# Patient Record
Sex: Male | Born: 1940
Health system: Southern US, Community
[De-identification: ages and names within clinical notes are randomized; demographics above are authoritative.]

## PROBLEM LIST (undated history)

## (undated) DIAGNOSIS — E785 Hyperlipidemia, unspecified: Secondary | ICD-10-CM

## (undated) DIAGNOSIS — I2510 Atherosclerotic heart disease of native coronary artery without angina pectoris: Secondary | ICD-10-CM

## (undated) DIAGNOSIS — C61 Malignant neoplasm of prostate: Secondary | ICD-10-CM

## (undated) DIAGNOSIS — R55 Syncope and collapse: Secondary | ICD-10-CM

## (undated) DIAGNOSIS — I10 Essential (primary) hypertension: Secondary | ICD-10-CM

## (undated) DIAGNOSIS — I249 Acute ischemic heart disease, unspecified: Secondary | ICD-10-CM

## (undated) HISTORY — DX: Hyperlipidemia, unspecified: E78.5

## (undated) HISTORY — DX: Atherosclerotic heart disease of native coronary artery without angina pectoris: I25.10

## (undated) HISTORY — PX: KNEE SURGERY: SHX244

## (undated) HISTORY — PX: CORONARY ANGIOPLASTY: SHX604

## (undated) HISTORY — PX: PROSTATE BIOPSY: SHX241

## (undated) HISTORY — DX: Acute ischemic heart disease, unspecified: I24.9

## (undated) HISTORY — DX: Syncope and collapse: R55

---

## 1998-08-19 ENCOUNTER — Encounter: Payer: Self-pay | Admitting: *Deleted

## 1998-08-19 ENCOUNTER — Inpatient Hospital Stay (HOSPITAL_COMMUNITY): Admission: EM | Admit: 1998-08-19 | Discharge: 1998-08-23 | Payer: Self-pay | Admitting: *Deleted

## 2004-09-23 DIAGNOSIS — I249 Acute ischemic heart disease, unspecified: Secondary | ICD-10-CM

## 2004-09-23 HISTORY — DX: Acute ischemic heart disease, unspecified: I24.9

## 2009-09-29 HISTORY — PX: CARDIOVASCULAR STRESS TEST: SHX262

## 2009-10-13 ENCOUNTER — Inpatient Hospital Stay (HOSPITAL_BASED_OUTPATIENT_CLINIC_OR_DEPARTMENT_OTHER): Admission: RE | Admit: 2009-10-13 | Discharge: 2009-10-13 | Payer: Self-pay | Admitting: Cardiovascular Disease

## 2009-10-13 HISTORY — PX: CARDIAC CATHETERIZATION: SHX172

## 2009-12-15 ENCOUNTER — Encounter: Admission: RE | Admit: 2009-12-15 | Discharge: 2009-12-15 | Payer: Self-pay | Admitting: Cardiology

## 2009-12-15 HISTORY — PX: US ECHOCARDIOGRAPHY: HXRAD669

## 2010-09-26 ENCOUNTER — Ambulatory Visit: Payer: Self-pay | Admitting: Cardiology

## 2010-09-27 ENCOUNTER — Ambulatory Visit: Payer: Self-pay | Admitting: Cardiology

## 2010-12-12 ENCOUNTER — Other Ambulatory Visit: Payer: Self-pay | Admitting: *Deleted

## 2010-12-12 DIAGNOSIS — E7800 Pure hypercholesterolemia, unspecified: Secondary | ICD-10-CM

## 2010-12-12 MED ORDER — SIMVASTATIN 20 MG PO TABS: 20.0000 mg | ORAL_TABLET | Freq: Every evening | ORAL | Status: DC

## 2010-12-12 NOTE — Telephone Encounter (Signed)
Refilled meds per fax request.  

## 2011-04-26 ENCOUNTER — Encounter: Payer: Self-pay | Admitting: Cardiology

## 2011-04-29 ENCOUNTER — Encounter: Payer: Self-pay | Admitting: Cardiology

## 2011-05-02 ENCOUNTER — Encounter: Payer: Self-pay | Admitting: Cardiology

## 2011-05-03 ENCOUNTER — Ambulatory Visit (INDEPENDENT_AMBULATORY_CARE_PROVIDER_SITE_OTHER): Payer: BC Managed Care – PPO | Admitting: *Deleted

## 2011-05-03 ENCOUNTER — Ambulatory Visit (INDEPENDENT_AMBULATORY_CARE_PROVIDER_SITE_OTHER): Payer: BC Managed Care – PPO | Admitting: Cardiology

## 2011-05-03 ENCOUNTER — Encounter: Payer: Self-pay | Admitting: Cardiology

## 2011-05-03 VITALS — BP 110/75 | HR 64 | Wt 188.0 lb

## 2011-05-03 DIAGNOSIS — E7800 Pure hypercholesterolemia, unspecified: Secondary | ICD-10-CM

## 2011-05-03 DIAGNOSIS — E785 Hyperlipidemia, unspecified: Secondary | ICD-10-CM

## 2011-05-03 DIAGNOSIS — I259 Chronic ischemic heart disease, unspecified: Secondary | ICD-10-CM

## 2011-05-03 LAB — HEPATIC FUNCTION PANEL
ALT: 20 U/L (ref 0–53)
AST: 17 U/L (ref 0–37)
Albumin: 4.5 g/dL (ref 3.5–5.2)
Alkaline Phosphatase: 51 U/L (ref 39–117)
Bilirubin, Direct: 0.1 mg/dL (ref 0.0–0.3)
Total Bilirubin: 1 mg/dL (ref 0.3–1.2)
Total Protein: 7.7 g/dL (ref 6.0–8.3)

## 2011-05-03 LAB — BASIC METABOLIC PANEL
BUN: 16 mg/dL (ref 6–23)
CO2: 29 mEq/L (ref 19–32)
Calcium: 9 mg/dL (ref 8.4–10.5)
Chloride: 104 mEq/L (ref 96–112)
Creatinine, Ser: 1 mg/dL (ref 0.4–1.5)
GFR: 75.89 mL/min (ref >60.00)
Glucose, Bld: 93 mg/dL (ref 70–99)
Potassium: 4.4 mEq/L (ref 3.5–5.1)
Sodium: 140 mEq/L (ref 135–145)

## 2011-05-03 LAB — LIPID PANEL
Cholesterol: 126 mg/dL (ref 0–200)
HDL: 39.5 mg/dL (ref >39.00)
LDL Cholesterol: 70 mg/dL (ref 0–99)
Total CHOL/HDL Ratio: 3
Triglycerides: 82 mg/dL (ref 0.0–149.0)
VLDL: 16.4 mg/dL (ref 0.0–40.0)

## 2011-05-03 NOTE — Assessment & Plan Note (Addendum)
Since last visit the patient has not had any symptoms of recurrent angina pectoris.   he has not had any dizziness or syncope.The patient is not on beta blocker because of intolerance and side effects including erectile dysfunction.

## 2011-05-03 NOTE — Progress Notes (Signed)
Kurt Diaz Date of Birth:  Jun 30, 1941 [facility] [address]   [address] [place], [place]  [address] [phone]           Fax   [phone]  HPI:   Current Outpatient Prescriptions  Medication Sig Dispense Refill  . aspirin 81 MG tablet Take 81 mg by mouth daily.        . nitroGLYCERIN (NITROSTAT) 0.4 MG SL tablet Place 0.4 mg under the tongue every 5 (five) minutes as needed.        . simvastatin (ZOCOR) 20 MG tablet Take 1 tablet (20 mg total) by mouth every evening.  30 tablet  11    No Known Allergies  There is no problem list on file for this patient.   History  Smoking status  . Never Smoker   Smokeless tobacco  . Not on file    History  Alcohol Use No    Family History  Problem Relation Age of Onset  . Heart attack Mother   . Stroke Mother   . Lung cancer Father     Review of Systems: The patient denies any heat or cold intolerance.  No weight gain or weight loss.  The patient denies headaches or blurry vision.  There is no cough or sputum production.  The patient denies dizziness.  There is no hematuria or hematochezia.  The patient denies any muscle aches or arthritis.  The patient denies any rash.  The patient denies frequent falling or instability.  There is no history of depression or anxiety.  All other systems were reviewed and are negative.   Physical Exam: Filed Vitals:   05/03/11 1033  BP: 110/75  Pulse: 64      Assessment / Plan:

## 2011-05-03 NOTE — Progress Notes (Signed)
[name] Date of Birth:  [birth year] [facility] [address]   [address] [place], [place]  [address] [phone]           Fax   [phone]  History of Present Illness: This pleasant 70 year old gentleman is seen for a six-month followup office visit.  He is a former patient of Dr. Reyes Ivan.  The patient had a history of an acute coronary syndrome in 2006.  At that time he was in the [place] airport and passed out and was found to be pulseless and underwent CPR.  He was taken to a local hospital and then to a heart Center where he underwent emergency cardiac catheterization and had intervention with 2 vessel disease being found and he had angioplasty and stenting of his LAD followed by angioplasty and stenting of his right coronary artery.  He had a nuclear scan on 09/29/09 which showed a small area of reversible ischemia at the apex and he underwent cardiac catheterization on 10/13/09 which showed that the stents were widely patent but that there was a proximal 70% stenosis in the diagonal too small for percutaneous intervention.  The patient has done well.  He's not having any chest pain.  He has not been getting any regular exercise but when he does use his treadmill he is not having any ischemic symptoms.  He is disappointed that he has not been able to lose weight and his fact his weight is up 5 pounds since last visit.  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 aspirin 81 MG tablet Take 81 mg by mouth daily.        . nitroGLYCERIN (NITROSTAT) 0.4 MG SL tablet Place 0.4 mg under the tongue every 5 (five) minutes as needed.        . simvastatin (ZOCOR) 20 MG tablet Take 1 tablet (20 mg total) by mouth every evening.  30 tablet  11    No Known  Allergies  There is no problem list on file for this patient.   History  Smoking status  . Never Smoker   Smokeless tobacco  . Not on file    History  Alcohol Use No    Family History  Problem Relation Age of Onset  . Heart attack Mother   . Stroke Mother   . Lung cancer Father     Review of Systems: Constitutional: no fever chills diaphoresis or fatigue or change in weight.  Head and neck: no hearing loss, no epistaxis, no photophobia or visual disturbance. Respiratory: No cough, shortness of breath or wheezing. Cardiovascular: No chest pain peripheral edema, palpitations. Gastrointestinal: No abdominal distention, no abdominal pain, no change in bowel habits hematochezia or melena. Genitourinary: No dysuria, no frequency, no urgency, no nocturia. Musculoskeletal:No arthralgias, no back pain, no gait disturbance or myalgias. Neurological: No dizziness, no headaches, no numbness, no seizures, no syncope, no weakness, no tremors. Hematologic: No lymphadenopathy, no easy bruising. Psychiatric: No confusion, no hallucinations, no sleep disturbance.    Physical Exam: Filed Vitals:   05/03/11 1033  BP: 110/75  Pulse: 64  The general appearance reveals a well-developed well-nourished gentleman in no distress.The head and neck exam reveals pupils equal and reactive.  Extraocular movements are full.  There is no  scleral icterus.  The mouth and pharynx are normal.  The neck is supple.  The carotids reveal no bruits.  The jugular venous pressure is normal.  The  thyroid is not enlarged.  There is no lymphadenopathy.  The chest is clear to percussion and auscultation.  There are no rales or rhonchi.  Expansion of the chest is symmetrical.  The precordium is quiet.  The first heart sound is normal.  The second heart sound is physiologically split.  There is no murmur gallop rub or click.  There is no abnormal lift or heave.  The abdomen is soft and nontender.  The bowel sounds are  normal.  The liver and spleen are not enlarged.  There are no abdominal masses.  There are no abdominal bruits.  Extremities reveal good pedal pulses.  There is no phlebitis or edema.  There is no cyanosis or clubbing.  Strength is normal and symmetrical in all extremities.  There is no lateralizing weakness.  There are no sensory deficits.  The skin is warm and dry.  There is no rash.     Assessment / Plan: Continue same medication.  Work harder at cutting back on calories to lose weight.  Increase regular exercise.  Recheck in 6 months for followup office visit And EKG and fasting lab work.  After that consider followup nuclear stress test.

## 2011-05-10 ENCOUNTER — Telehealth: Payer: Self-pay | Admitting: *Deleted

## 2011-05-10 NOTE — Telephone Encounter (Signed)
Patient returned call, advised of labs

## 2011-05-10 NOTE — Telephone Encounter (Signed)
Message copied by Burnell Blanks on Fri May 10, 2011  2:25 PM ------      Message from: Cassell Clement      Created: Sun May 05, 2011  9:15 PM       LP is good.      Chemistries all good.  CSD

## 2011-05-10 NOTE — Progress Notes (Signed)
Advised of labs 

## 2011-11-11 ENCOUNTER — Other Ambulatory Visit (INDEPENDENT_AMBULATORY_CARE_PROVIDER_SITE_OTHER): Payer: BC Managed Care – PPO | Admitting: *Deleted

## 2011-11-11 ENCOUNTER — Ambulatory Visit (INDEPENDENT_AMBULATORY_CARE_PROVIDER_SITE_OTHER): Payer: BC Managed Care – PPO | Admitting: Cardiology

## 2011-11-11 ENCOUNTER — Encounter: Payer: Self-pay | Admitting: Cardiology

## 2011-11-11 VITALS — BP 118/86 | HR 60 | Ht 68.0 in | Wt 182.0 lb

## 2011-11-11 DIAGNOSIS — I259 Chronic ischemic heart disease, unspecified: Secondary | ICD-10-CM

## 2011-11-11 DIAGNOSIS — E785 Hyperlipidemia, unspecified: Secondary | ICD-10-CM

## 2011-11-11 DIAGNOSIS — E7800 Pure hypercholesterolemia, unspecified: Secondary | ICD-10-CM

## 2011-11-11 LAB — BASIC METABOLIC PANEL
BUN: 15 mg/dL (ref 6–23)
CO2: 28 mEq/L (ref 19–32)
Calcium: 9 mg/dL (ref 8.4–10.5)
Chloride: 106 mEq/L (ref 96–112)
Creatinine, Ser: 0.8 mg/dL (ref 0.4–1.5)
GFR: 101.43 mL/min (ref >60.00)
Glucose, Bld: 98 mg/dL (ref 70–99)
Potassium: 4.1 mEq/L (ref 3.5–5.1)
Sodium: 141 mEq/L (ref 135–145)

## 2011-11-11 LAB — HEPATIC FUNCTION PANEL
ALT: 19 U/L (ref 0–53)
AST: 24 U/L (ref 0–37)
Albumin: 4.2 g/dL (ref 3.5–5.2)
Alkaline Phosphatase: 46 U/L (ref 39–117)
Bilirubin, Direct: 0.1 mg/dL (ref 0.0–0.3)
Total Bilirubin: 0.8 mg/dL (ref 0.3–1.2)
Total Protein: 7.1 g/dL (ref 6.0–8.3)

## 2011-11-11 LAB — LIPID PANEL
Cholesterol: 125 mg/dL (ref 0–200)
HDL: 42.3 mg/dL (ref >39.00)
LDL Cholesterol: 71 mg/dL (ref 0–99)
Total CHOL/HDL Ratio: 3
Triglycerides: 58 mg/dL (ref 0.0–149.0)
VLDL: 11.6 mg/dL (ref 0.0–40.0)

## 2011-11-11 MED ORDER — NITROGLYCERIN 0.4 MG SL SUBL: 0.4000 mg | SUBLINGUAL_TABLET | SUBLINGUAL | Status: DC | PRN

## 2011-11-11 NOTE — Patient Instructions (Signed)
Will obtain labs today and call you with the results (LP/BMET/HFP) Your physician recommends that you continue on your current medications as directed. Please refer to the Current Medication list given to you today. Your physician wants you to follow-up in: 6 months You will receive a reminder letter in the mail two months in advance. If you don't receive a letter, please call our office to schedule the follow-up appointment.  

## 2011-11-11 NOTE — Assessment & Plan Note (Signed)
He has occasional localized left anterior chest pain at rest but never with exercise such as using a treadmill or walking or climbing stairs.  He carries nitroglycerin but has never used it.

## 2011-11-11 NOTE — Assessment & Plan Note (Signed)
The patient remains on simvastatin.  We are checking labs today.  He is having no side effects.

## 2011-11-11 NOTE — Progress Notes (Signed)
Quick Note:  Please report to patient. The recent labs are stable. Continue same medication and careful diet. ______ 

## 2011-11-11 NOTE — Progress Notes (Signed)
[name] Date of Birth:  1941/06/29 [facility] [address] [place], [place]  [address] [phone]  Fax   [phone]  HPI: This pleasant 71 year old gentleman is seen for a six-month followup office visit.  He has a history of known ischemic heart disease.  He had acute coronary syndrome in 2006.  He had [place] airport and was found to be pulseless and underwent CPR and then underwent emergency cardiac catheterization and had intervention with 2 vessel disease being found and he had angioplasty and stenting to his LAD and angioplasty and stenting of his right coronary artery.  He had a nuclear scan in January 2011 which showed a small area of reversible ischemia at the apex and he underwent cardiac catheterization on 10/13/09 showed that the stents were widely patent but there was a proximal 70% stenosis in the diagonal too small for percutaneous intervention.  The patient has done well on medical management.  He is not having any recurrent exertional chest pain.  He has been able to lose weight.  He is on a low cholesterol diet.  Current Outpatient Prescriptions  Medication Sig Dispense Refill  . aspirin 81 MG tablet Take 81 mg by mouth daily.        . nitroGLYCERIN (NITROSTAT) 0.4 MG SL tablet Place 1 tablet (0.4 mg total) under the tongue every 5 (five) minutes as needed.  25 tablet  prn  . simvastatin (ZOCOR) 20 MG tablet Take 1 tablet (20 mg total) by mouth every evening.  30 tablet  11    No Known Allergies  Patient Active Problem List  Diagnoses  . Ischemic heart disease  . Hypercholesterolemia    History  Smoking status  . Never Smoker   Smokeless tobacco  . Not on file    History  Alcohol Use No    Family History  Problem Relation Age of Onset  . Heart attack Mother   . Stroke Mother   . Lung cancer Father     Review of Systems: The patient denies any heat or cold intolerance.  No weight gain or weight loss.  The  patient denies headaches or blurry vision.  There is no cough or sputum production.  The patient denies dizziness.  There is no hematuria or hematochezia.  The patient denies any muscle aches or arthritis.  The patient denies any rash.  The patient denies frequent falling or instability.  There is no history of depression or anxiety.  All other systems were reviewed and are negative.   Physical Exam: Filed Vitals:   11/11/11 0853  BP: 118/86  Pulse: 60   The general appearance reveals a well-developed well-nourished gentleman in no distress.The head and neck exam reveals pupils equal and reactive.  Extraocular movements are full.  There is no scleral icterus.  The mouth and pharynx are normal.  The neck is supple.  The carotids reveal no bruits.  The jugular venous pressure is normal.  The  thyroid is not enlarged.  There is no lymphadenopathy.  The chest is clear to percussion and auscultation.  There are no rales or rhonchi.  Expansion of the chest is symmetrical.  The precordium is quiet.  The first heart sound is normal.  The second heart sound is physiologically split.  There is no murmur gallop rub or click.  There is no abnormal lift or heave.  The abdomen is soft and nontender.  The bowel sounds are normal.  The liver and  spleen are not enlarged.  There are no abdominal masses.  There are no abdominal bruits.  Extremities reveal good pedal pulses.  There is no phlebitis or edema.  There is no cyanosis or clubbing.  Strength is normal and symmetrical in all extremities.  There is no lateralizing weakness.  There are no sensory deficits.  The skin is warm and dry.  There is no rash.  EKG today shows sinus bradycardia and otherwise within normal limits.  No ischemic changes.   Assessment / Plan: Await results of today's labs.  Continue same medication.  Recheck in 6 months for followup office visit lipid panel hepatic function panel and basal metabolic panel.

## 2011-11-12 ENCOUNTER — Telehealth: Payer: Self-pay | Admitting: *Deleted

## 2011-11-12 NOTE — Telephone Encounter (Signed)
Message copied by Burnell Blanks on Tue Nov 12, 2011  4:04 PM ------      Message from: Cassell Clement      Created: Mon Nov 11, 2011  7:19 PM       Please report to patient.  The recent labs are stable. Continue same medication and careful diet.

## 2011-11-12 NOTE — Telephone Encounter (Signed)
Mailed copy of labs and left message to call if any questions  

## 2011-12-13 ENCOUNTER — Other Ambulatory Visit: Payer: Self-pay | Admitting: Cardiology

## 2012-05-08 ENCOUNTER — Other Ambulatory Visit (INDEPENDENT_AMBULATORY_CARE_PROVIDER_SITE_OTHER): Payer: BC Managed Care – PPO

## 2012-05-08 ENCOUNTER — Ambulatory Visit (INDEPENDENT_AMBULATORY_CARE_PROVIDER_SITE_OTHER): Payer: BC Managed Care – PPO | Admitting: Cardiology

## 2012-05-08 ENCOUNTER — Encounter: Payer: Self-pay | Admitting: Cardiology

## 2012-05-08 VITALS — BP 120/78 | HR 60 | Ht 68.0 in | Wt 186.0 lb

## 2012-05-08 DIAGNOSIS — E7800 Pure hypercholesterolemia, unspecified: Secondary | ICD-10-CM

## 2012-05-08 DIAGNOSIS — R0989 Other specified symptoms and signs involving the circulatory and respiratory systems: Secondary | ICD-10-CM

## 2012-05-08 DIAGNOSIS — I259 Chronic ischemic heart disease, unspecified: Secondary | ICD-10-CM

## 2012-05-08 LAB — HEPATIC FUNCTION PANEL
ALT: 21 U/L (ref 0–53)
AST: 21 U/L (ref 0–37)
Albumin: 4.2 g/dL (ref 3.5–5.2)
Alkaline Phosphatase: 39 U/L (ref 39–117)
Bilirubin, Direct: 0.1 mg/dL (ref 0.0–0.3)
Total Bilirubin: 1.1 mg/dL (ref 0.3–1.2)
Total Protein: 7.3 g/dL (ref 6.0–8.3)

## 2012-05-08 LAB — LIPID PANEL
Cholesterol: 129 mg/dL (ref 0–200)
HDL: 39.4 mg/dL (ref >39.00)
LDL Cholesterol: 74 mg/dL (ref 0–99)
Total CHOL/HDL Ratio: 3
Triglycerides: 79 mg/dL (ref 0.0–149.0)
VLDL: 15.8 mg/dL (ref 0.0–40.0)

## 2012-05-08 LAB — BASIC METABOLIC PANEL
BUN: 17 mg/dL (ref 6–23)
CO2: 28 mEq/L (ref 19–32)
Calcium: 9.3 mg/dL (ref 8.4–10.5)
Chloride: 105 mEq/L (ref 96–112)
Creatinine, Ser: 0.9 mg/dL (ref 0.4–1.5)
GFR: 85.13 mL/min (ref >60.00)
Glucose, Bld: 97 mg/dL (ref 70–99)
Potassium: 4.6 mEq/L (ref 3.5–5.1)
Sodium: 140 mEq/L (ref 135–145)

## 2012-05-08 NOTE — Patient Instructions (Addendum)
Will obtain labs today and call you with the results   Your physician recommends that you continue on your current medications as directed. Please refer to the Current Medication list given to you today.  Your physician wants you to follow-up in: 6 months with fasting labs (lp/bmet/hfp)  You will receive a reminder letter in the mail two months in advance. If you don't receive a letter, please call our office to schedule the follow-up appointment.  

## 2012-05-08 NOTE — Assessment & Plan Note (Signed)
The patient has not been expressing any recurrent angina.  He has occasional episodes of sticking brief pain in various areas of his left chest which does not sound ischemic.  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

## 2012-05-08 NOTE — Assessment & Plan Note (Signed)
The patient has a history of hypercholesterolemia.  He remains on simvastatin 20 mg daily.  Is not having any side effects.  His weight is up slightly since last visit

## 2012-05-08 NOTE — Progress Notes (Signed)
Quick Note:  Please report to patient. The recent labs are stable. Continue same medication and careful diet. ______ 

## 2012-05-08 NOTE — Progress Notes (Signed)
Kurt Diaz Date of Birth:  Nov 24, 1940 [facility] [address] [place], [place]  [address] [phone]  Fax   [phone]  HPI: This pleasant 71 year old gentleman is seen for a scheduled 6 month followup office visit.  He has a past history of known ischemic heart disease.  He had acute coronary syndrome in 2006 he had syncope [place] airport and was found to be pulseless and underwent CPR and then underwent emergency cardiac catheterization and had intervention with 2 vessel disease being found.  He had angioplasty and stenting to his LAD and angioplasty and stenting of his right coronary artery.  His last nuclear stress test January 2011 showed a small area of reversible ischemia and he underwent cardiac catheterization on 10/13/2009 showed that the stents were widely patent.  There was a proximal 70% stenosis in the diagonal which was too small for PCI.  Current Outpatient Prescriptions  Medication Sig Dispense Refill  . aspirin 81 MG tablet Take 81 mg by mouth daily.      . nitroGLYCERIN (NITROSTAT) 0.4 MG SL tablet Place 0.4 mg under the tongue every 5 (five) minutes as needed.      . simvastatin (ZOCOR) 20 MG tablet Take 20 mg by mouth every evening.        No Known Allergies  Patient Active Problem List  Diagnosis  . Ischemic heart disease  . Hypercholesterolemia    History  Smoking status  . Never Smoker   Smokeless tobacco  . Not on file    History  Alcohol Use No    Family History  Problem Relation Age of Onset  . Heart attack Mother   . Stroke Mother   . Lung cancer Father     Review of Systems: The patient denies any heat or cold intolerance.  No weight gain or weight loss.  The patient denies headaches or blurry vision.  There is no cough or sputum production.  The patient denies dizziness.  There is no hematuria or hematochezia.  The patient denies any muscle aches or arthritis.  The patient denies any rash.  The  patient denies frequent falling or instability.  There is no history of depression or anxiety.  All other systems were reviewed and are negative.   Physical Exam: Filed Vitals:   05/08/12 0846  BP: 120/78  Pulse: 60   the general appearance reveals a well-developed well-nourished gentleman in no distress.The head and neck exam reveals pupils equal and reactive.  Extraocular movements are full.  There is no scleral icterus.  The mouth and pharynx are normal.  The neck is supple.  The carotids reveal no bruits.  The jugular venous pressure is normal.  The  thyroid is not enlarged.  There is no lymphadenopathy.  The chest is clear to percussion and auscultation.  There are no rales or rhonchi.  Expansion of the chest is symmetrical.  The precordium is quiet.  The first heart sound is normal.  The second heart sound is physiologically split.  There is no murmur gallop rub or click.  There is no abnormal lift or heave.  The abdomen is soft and nontender.  The bowel sounds are normal.  The liver and spleen are not enlarged.  There are no abdominal masses.  There are no abdominal bruits.  Extremities reveal good pedal pulses.  There is no phlebitis or edema.  There is no cyanosis or clubbing.  Strength is normal and symmetrical in all extremities.  There  is no lateralizing weakness.  There are no sensory deficits.  The skin is warm and dry.  There is no rash.      Assessment / Plan: The patient is to continue on same medication.  He will work harder on diet and weight loss.  Recheck in 6 months for office visit EKG and fasting lipid panel hepatic function panel and basal metabolic panel

## 2012-05-12 ENCOUNTER — Telehealth: Payer: Self-pay | Admitting: *Deleted

## 2012-05-12 NOTE — Telephone Encounter (Signed)
Mailed copy of labs and left message to call if any questions  

## 2012-05-12 NOTE — Telephone Encounter (Signed)
Message copied by Burnell Blanks on Tue May 12, 2012 12:01 PM ------      Message from: Cassell Clement      Created: Fri May 08, 2012  9:29 PM       Please report to patient.  The recent labs are stable. Continue same medication and careful diet.

## 2012-11-25 ENCOUNTER — Encounter: Payer: Self-pay | Admitting: *Deleted

## 2012-11-25 ENCOUNTER — Encounter: Payer: Self-pay | Admitting: Cardiology

## 2012-11-25 ENCOUNTER — Ambulatory Visit (INDEPENDENT_AMBULATORY_CARE_PROVIDER_SITE_OTHER): Payer: BC Managed Care – PPO | Admitting: Cardiology

## 2012-11-25 ENCOUNTER — Telehealth: Payer: Self-pay | Admitting: Cardiology

## 2012-11-25 ENCOUNTER — Other Ambulatory Visit: Payer: Self-pay | Admitting: Cardiology

## 2012-11-25 VITALS — BP 140/78 | HR 54 | Ht 68.0 in | Wt 189.0 lb

## 2012-11-25 DIAGNOSIS — R079 Chest pain, unspecified: Secondary | ICD-10-CM

## 2012-11-25 DIAGNOSIS — E7800 Pure hypercholesterolemia, unspecified: Secondary | ICD-10-CM

## 2012-11-25 DIAGNOSIS — I259 Chronic ischemic heart disease, unspecified: Secondary | ICD-10-CM

## 2012-11-25 LAB — CBC
HCT: 45.2 % (ref 39.0–52.0)
Hemoglobin: 14.9 g/dL (ref 13.0–17.0)
MCHC: 33.1 g/dL (ref 30.0–36.0)
MCV: 87.5 fl (ref 78.0–100.0)
Platelets: 240 10*3/uL (ref 150.0–400.0)
RBC: 5.17 Mil/uL (ref 4.22–5.81)
RDW: 14.3 % (ref 11.5–14.6)
WBC: 7.8 10*3/uL (ref 4.5–10.5)

## 2012-11-25 LAB — PROTIME-INR
INR: 1.1 ratio — ABNORMAL HIGH (ref 0.8–1.0)
Prothrombin Time: 11.3 s (ref 10.2–12.4)

## 2012-11-25 LAB — BASIC METABOLIC PANEL
BUN: 18 mg/dL (ref 6–23)
CO2: 29 mEq/L (ref 19–32)
Calcium: 8.9 mg/dL (ref 8.4–10.5)
Chloride: 105 mEq/L (ref 96–112)
Creatinine, Ser: 0.9 mg/dL (ref 0.4–1.5)
GFR: 89.42 mL/min (ref >60.00)
Glucose, Bld: 91 mg/dL (ref 70–99)
Potassium: 4.1 mEq/L (ref 3.5–5.1)
Sodium: 140 mEq/L (ref 135–145)

## 2012-11-25 NOTE — Telephone Encounter (Signed)
Pt calls today with increasing episodes of chest pain that will awaken him at night lasting several minutes. States it is not a lot of pressure just pain/discomfort. He has not taken any NTG for this. He also notes increasing dyspnea when walking up steps to work. "more out of breath than usual. Pt also notes his blood pressure has been going up BP 150-170/80  Appt made with Dr. Patty Sermons today at 11:00am    Pt was also overdue for 6 month follow-up Mylo Red RN

## 2012-11-25 NOTE — Telephone Encounter (Signed)
Seen in office today and he will have cardiac catheterization on March 7 Dr. Elease Hashimoto in the [facility]

## 2012-11-25 NOTE — Assessment & Plan Note (Signed)
With his new symptoms over the past 3 weeks we discussed possible repeat Myoview versus cardiac catheterization.  He has not had a Myoview since his last catheter.  His previous Myoview resulted in a cardiac catheterization because it was abnormal and showed reversible ischemia.  We are likely to find the same thing this time and so we will proceed directly to diagnostic catheter.  He is pleased with that plan.  He will undergo diagnostic cardiac catheterization on Friday, March 7 by Dr. Elease Hashimoto in the [facility] again.

## 2012-11-25 NOTE — Telephone Encounter (Signed)
noted 

## 2012-11-25 NOTE — Patient Instructions (Addendum)
[facility] 11/27/12 @ 8:30 see letter  Your physician recommends that you return for lab work in: today bmet cbc pt/inr   Your physician recommends that you continue on your current medications as directed. Please refer to the Current Medication list given to you today.

## 2012-11-25 NOTE — Assessment & Plan Note (Signed)
Patient has a history of hypercholesterolemia.  He remains on simvastatin.  No myalgias.

## 2012-11-25 NOTE — Progress Notes (Signed)
Kurt Diaz Date of Birth:  07/20/1941  [facility] [address] [place],   [address] [phone]         Fax   [phone]  History of Present Illness: This pleasant 72-year-old gentleman is seen for a work in  office visit.  He has been experiencing some exertional dyspnea and some chest pain intermittently for the past several weeks.  His discomfort has been mainly at night.  It lasts only about 3 minutes and goes away.  Twice it also appeared to radiate to his jaw.  He was not too concerned about it and has not taken any of his sublingual nitroglycerin.  There was no nausea or diaphoresis.  The patient admits that he has not been doing much exercise since Christmas and his weight is up 3 pounds and he has been off his careful diet.  He has noted more exertional dyspnea in the past several weeks, particularly if he has to climb 2 flights of steps.  He has a past history of known ischemic heart disease. He had acute coronary syndrome in 2006 he had syncope in the [place] airport and was found to be pulseless and underwent CPR and then underwent emergency cardiac catheterization and had intervention with 2 vessel disease being found. He had angioplasty and stenting to his LAD and angioplasty and stenting of his right coronary artery. His last nuclear stress test January 2011 showed a small area of reversible ischemia and as a result he underwent cardiac catheterization by Dr. Nahserr on 10/13/2009 which showed that the stents were widely patent. There was a proximal 70% stenosis in the diagonal which was too small for PCI.  He has been doing well on medical therapy until about the past 3 weeks when these symptoms started.   Current Outpatient Prescriptions  Medication Sig Dispense Refill  . aspirin 81 MG tablet Take 81 mg by mouth daily.      . nitroGLYCERIN (NITROSTAT) 0.4 MG SL tablet Place 0.4 mg under the tongue every 5 (five) minutes as needed.      .  simvastatin (ZOCOR) 20 MG tablet Take 20 mg by mouth every evening.      . tadalafil (CIALIS) 10 MG tablet Take 10 mg by mouth daily as needed for erectile dysfunction.       No current facility-administered medications for this visit.    No Known Allergies  Patient Active Problem List  Diagnosis  . Ischemic heart disease  . Hypercholesterolemia    History  Smoking status  . Never Smoker   Smokeless tobacco  . Not on file    History  Alcohol Use No    Family History  Problem Relation Age of Onset  . Heart attack Mother   . Stroke Mother   . Lung cancer Father     Review of Systems: Constitutional: no fever chills diaphoresis or fatigue or change in weight.  Head and neck: no hearing loss, no epistaxis, no photophobia or visual disturbance. Respiratory: No cough, shortness of breath or wheezing. Cardiovascular: No chest pain peripheral edema, palpitations. Gastrointestinal: No abdominal distention, no abdominal pain, no change in bowel habits hematochezia or melena. Genitourinary: No dysuria, no frequency, no urgency, no nocturia. Musculoskeletal:No arthralgias, no back pain, no gait disturbance or myalgias. Neurological: No dizziness, no headaches, no numbness, no seizures, no syncope, no weakness, no tremors. Hematologic: No lymphadenopathy, no easy bruising. Psychiatric: No confusion, no hallucinations, no sleep disturbance.    Physical Exam: Filed Vitals:     11/25/12 1114  BP: 140/78  Pulse: 54   the general appearance reveals a well-developed well-nourished gentleman in no distress.  He is mildly overweight and has gained 3 pounds since last visit.The head and neck exam reveals pupils equal and reactive.  Extraocular movements are full.  There is no scleral icterus.  The mouth and pharynx are normal.  The neck is supple.  The carotids reveal no bruits.  The jugular venous pressure is normal.  The  thyroid is not enlarged.  There is no lymphadenopathy.  The chest  is clear to percussion and auscultation.  There are no rales or rhonchi.  Expansion of the chest is symmetrical.  The precordium is quiet.  The first heart sound is normal.  The second heart sound is physiologically split.  There is no murmur gallop rub or click.  There is no abnormal lift or heave.  The abdomen is soft and nontender.  The bowel sounds are normal.  The liver and spleen are not enlarged.  There are no abdominal masses.  There are no abdominal bruits.  Extremities reveal good pedal pulses.  There is no phlebitis or edema.  There is no cyanosis or clubbing.  Strength is normal and symmetrical in all extremities.  There is no lateralizing weakness.  There are no sensory deficits.  The skin is warm and dry.  There is no rash.  EKG shows sinus bradycardia and no ischemic changes   Assessment / Plan: Possible recurrent angina pectoris.  He is also having increased exertional dyspnea which was a symptom that he had prior to his previous heart attack.  We will proceed with cardiac catheterization in the [facility] on Friday, March 7    

## 2012-11-25 NOTE — Telephone Encounter (Signed)
New Prob   Pt having chest pain, persistant for about a week (mainly at night). Concerned and would like to speak to nurse.

## 2012-11-26 ENCOUNTER — Telehealth: Payer: Self-pay | Admitting: Cardiology

## 2012-11-26 NOTE — Telephone Encounter (Signed)
Pt needs to talk to you regarding procedure

## 2012-11-26 NOTE — Telephone Encounter (Signed)
Message copied by Burnell Blanks on Thu Nov 26, 2012  3:48 PM ------      Message from: Cassell Clement      Created: Wed Nov 25, 2012  5:54 PM       Precath labs are normal please report. ------

## 2012-11-26 NOTE — Telephone Encounter (Signed)
Rescheduled patients cath for Monday at 7:30 per patient request, was concerned about potential bad weather. Did advise if symptoms worse to go to ED, verbalized understanding and stated he would.  Advised patient of lab results   

## 2012-11-26 NOTE — Telephone Encounter (Signed)
New problem   Pt is scheduled to have a cather tomorrow 11/27/12 and want to r/s to another day. Please call pt concerning this matter.

## 2012-11-26 NOTE — Telephone Encounter (Signed)
Rescheduled patients cath for Monday at 7:30 per patient request, was concerned about potential bad weather. Did advise if symptoms worse to go to ED, verbalized understanding and stated he would.  Advised patient of lab results

## 2012-11-27 ENCOUNTER — Encounter (HOSPITAL_BASED_OUTPATIENT_CLINIC_OR_DEPARTMENT_OTHER): Admission: RE | Payer: Self-pay | Source: Ambulatory Visit

## 2012-11-27 ENCOUNTER — Inpatient Hospital Stay (HOSPITAL_BASED_OUTPATIENT_CLINIC_OR_DEPARTMENT_OTHER)
Admission: RE | Admit: 2012-11-27 | Payer: BC Managed Care – PPO | Source: Ambulatory Visit | Admitting: Cardiovascular Disease

## 2012-11-27 SURGERY — JV LEFT HEART CATHETERIZATION WITH CORONARY ANGIOGRAM

## 2012-11-30 ENCOUNTER — Encounter (HOSPITAL_BASED_OUTPATIENT_CLINIC_OR_DEPARTMENT_OTHER): Payer: Self-pay

## 2012-11-30 ENCOUNTER — Encounter (HOSPITAL_BASED_OUTPATIENT_CLINIC_OR_DEPARTMENT_OTHER)
Admission: RE | Disposition: A | Discharge status: Home / Self Care | Payer: Self-pay | Source: Ambulatory Visit | Attending: Cardiovascular Disease

## 2012-11-30 ENCOUNTER — Inpatient Hospital Stay (HOSPITAL_BASED_OUTPATIENT_CLINIC_OR_DEPARTMENT_OTHER)
Admission: RE | Admit: 2012-11-30 | Discharge: 2012-11-30 | Disposition: A | Discharge status: Home / Self Care | Payer: BC Managed Care – PPO | Source: Ambulatory Visit | Attending: Cardiovascular Disease | Admitting: Cardiovascular Disease

## 2012-11-30 DIAGNOSIS — I2510 Atherosclerotic heart disease of native coronary artery without angina pectoris: Secondary | ICD-10-CM | POA: Diagnosis not present

## 2012-11-30 DIAGNOSIS — Z9861 Coronary angioplasty status: Secondary | ICD-10-CM | POA: Insufficient documentation

## 2012-11-30 DIAGNOSIS — R079 Chest pain, unspecified: Secondary | ICD-10-CM | POA: Insufficient documentation

## 2012-11-30 SURGERY — JV LEFT HEART CATHETERIZATION WITH CORONARY ANGIOGRAM
Anesthesia: Moderate Sedation

## 2012-11-30 MED ORDER — DIAZEPAM 2 MG PO TABS: 2.0000 mg | ORAL_TABLET | ORAL | Status: DC | PRN

## 2012-11-30 MED ORDER — SODIUM CHLORIDE 0.45 % IV SOLN: INTRAVENOUS | Status: AC

## 2012-11-30 MED ORDER — OXYCODONE-ACETAMINOPHEN 5-325 MG PO TABS: 1.0000 | ORAL_TABLET | ORAL | Status: DC | PRN

## 2012-11-30 MED ORDER — ACETAMINOPHEN 325 MG PO TABS: 650.0000 mg | ORAL_TABLET | ORAL | Status: DC | PRN

## 2012-11-30 MED ORDER — ASPIRIN 81 MG PO CHEW: 324.0000 mg | CHEWABLE_TABLET | Freq: Once | ORAL | Status: DC

## 2012-11-30 MED ORDER — SODIUM CHLORIDE 0.9 % IJ SOLN: 3.0000 mL | Freq: Two times a day (BID) | INTRAMUSCULAR | Status: DC

## 2012-11-30 MED ORDER — ONDANSETRON HCL 4 MG/2ML IJ SOLN: 4.0000 mg | Freq: Four times a day (QID) | INTRAMUSCULAR | Status: DC | PRN

## 2012-11-30 MED ORDER — SODIUM CHLORIDE 0.9 % IJ SOLN: 3.0000 mL | INTRAMUSCULAR | Status: DC | PRN

## 2012-11-30 MED ORDER — SODIUM CHLORIDE 0.9 % IV SOLN: 250.0000 mL | INTRAVENOUS | Status: DC | PRN

## 2012-11-30 MED ORDER — ASPIRIN EC 325 MG PO TBEC: 325.0000 mg | DELAYED_RELEASE_TABLET | Freq: Every day | ORAL | Status: DC

## 2012-11-30 NOTE — OR Nursing (Signed)
+  Allen's test right hand 

## 2012-11-30 NOTE — OR Nursing (Signed)
Tegaderm dressing applied, site level 0, bedrest begins at 0820 

## 2012-11-30 NOTE — H&P (View-Only) (Signed)
Kurt Diaz Date of Birth:  07-20-1941 [facility] [address] [place], [place]  [address] [phone]         Fax   [phone]  History of Present Illness: This pleasant 72 year old gentleman is seen for a work in  office visit.  He has been experiencing some exertional dyspnea and some chest pain intermittently for the past several weeks.  His discomfort has been mainly at night.  It lasts only about 3 minutes and goes away.  Twice it also appeared to radiate to his jaw.  He was not too concerned about it and has not taken any of his sublingual nitroglycerin.  There was no nausea or diaphoresis.  The patient admits that he has not been doing much exercise since Christmas and his weight is up 3 pounds and he has been off his careful diet.  He has noted more exertional dyspnea in the past several weeks, particularly if he has to climb 2 flights of steps.  He has a past history of known ischemic heart disease. He had acute coronary syndrome in 2006 he [place] and was found to be pulseless and underwent CPR and then underwent emergency cardiac catheterization and had intervention with 2 vessel disease being found. He had angioplasty and stenting to his LAD and angioplasty and stenting of his right coronary artery. His last nuclear stress test January 2011 showed a small area of reversible ischemia and as a result he underwent cardiac catheterization by Dr. Reed Breech on 10/13/2009 which showed that the stents were widely patent. There was a proximal 70% stenosis in the diagonal which was too small for PCI.  He has been doing well on medical therapy until about the past 3 weeks when these symptoms started.   Current Outpatient Prescriptions  Medication Sig Dispense Refill  . aspirin 81 MG tablet Take 81 mg by mouth daily.      . nitroGLYCERIN (NITROSTAT) 0.4 MG SL tablet Place 0.4 mg under the tongue every 5 (five) minutes as needed.      .  simvastatin (ZOCOR) 20 MG tablet Take 20 mg by mouth every evening.      . tadalafil (CIALIS) 10 MG tablet Take 10 mg by mouth daily as needed for erectile dysfunction.       No current facility-administered medications for this visit.    No Known Allergies  Patient Active Problem List  Diagnosis  . Ischemic heart disease  . Hypercholesterolemia    History  Smoking status  . Never Smoker   Smokeless tobacco  . Not on file    History  Alcohol Use No    Family History  Problem Relation Age of Onset  . Heart attack Mother   . Stroke Mother   . Lung cancer Father     Review of Systems: Constitutional: no fever chills diaphoresis or fatigue or change in weight.  Head and neck: no hearing loss, no epistaxis, no photophobia or visual disturbance. Respiratory: No cough, shortness of breath or wheezing. Cardiovascular: No chest pain peripheral edema, palpitations. Gastrointestinal: No abdominal distention, no abdominal pain, no change in bowel habits hematochezia or melena. Genitourinary: No dysuria, no frequency, no urgency, no nocturia. Musculoskeletal:No arthralgias, no back pain, no gait disturbance or myalgias. Neurological: No dizziness, no headaches, no numbness, no seizures, no syncope, no weakness, no tremors. Hematologic: No lymphadenopathy, no easy bruising. Psychiatric: No confusion, no hallucinations, no sleep disturbance.    Physical Exam: Filed Vitals:  11/25/12 1114  BP: 140/78  Pulse: 54   the general appearance reveals a well-developed well-nourished gentleman in no distress.  He is mildly overweight and has gained 3 pounds since last visit.The head and neck exam reveals pupils equal and reactive.  Extraocular movements are full.  There is no scleral icterus.  The mouth and pharynx are normal.  The neck is supple.  The carotids reveal no bruits.  The jugular venous pressure is normal.  The  thyroid is not enlarged.  There is no lymphadenopathy.  The chest  is clear to percussion and auscultation.  There are no rales or rhonchi.  Expansion of the chest is symmetrical.  The precordium is quiet.  The first heart sound is normal.  The second heart sound is physiologically split.  There is no murmur gallop rub or click.  There is no abnormal lift or heave.  The abdomen is soft and nontender.  The bowel sounds are normal.  The liver and spleen are not enlarged.  There are no abdominal masses.  There are no abdominal bruits.  Extremities reveal good pedal pulses.  There is no phlebitis or edema.  There is no cyanosis or clubbing.  Strength is normal and symmetrical in all extremities.  There is no lateralizing weakness.  There are no sensory deficits.  The skin is warm and dry.  There is no rash.  EKG shows sinus bradycardia and no ischemic changes   Assessment / Plan: Possible recurrent angina pectoris.  He is also having increased exertional dyspnea which was a symptom that he had prior to his previous heart attack.  We will proceed with cardiac catheterization in the [facility] lab on Friday, March 7

## 2012-11-30 NOTE — OR Nursing (Signed)
Discharge instructions reviewed and signed, pt stated understanding, ambulated in hall without difficulty, site level 0, transported to wife's car via wheelchair 

## 2012-11-30 NOTE — CV Procedure (Signed)
      Catheterization   Indication: Chest pain previous stents to LAD and RCA  Procedure: After informed consent and clinical "time out" the right groin was prepped and draped in a sterile fashion.  A 5Fr sheath was placed in the right femoral artery using seldinger technique and local lidocaine.  Standard JL4, JR4 and angled pigtail catheters were used to engage the coronary arteries.  Coronary arteries were visualized in orthogonal views using caudal and cranial angulation.  RAO ventriculography was done using 28* cc of contrast.    Medications:   Versed: 2 mg's  Fentanyl: 0 ug's  Coronary Arteries: Right dominant with no anomalies  LM: 20% proximal  LAD: Widely patent stents in proximal and mid vessel    D1:  70% ostial jailed by stents no change from previous caht  D2 normal  Circumflex: Large somewhat codominant vessel   OM1: Normal  OM2: Normal  RCA:  Small caliber vessel Stent in mid vessel somewhat oversized and widely patent. 30% tubular stenosis distal to stent   PDA: normal small  PLA: normal small  Ventriculography: EF: 55 %, no RWMA;s   Hemodynamics:  Aortic Pressure: 126 59  mmHg  LV Pressure: 128 10 15  mmHg  Impression:  Stable CAD with widely patent stents to RCA and LAD.  Jailed ostium of D1 unchanged Continue medical Rx  Charlton Haws 11/30/2012 8:07 AM

## 2012-11-30 NOTE — OR Nursing (Signed)
Meal served 

## 2012-11-30 NOTE — Interval H&P Note (Signed)
History and Physical Interval Note:  11/30/2012 7:42 AM  Kurt Diaz  has presented today for surgery, with the diagnosis of chest pain  The various methods of treatment have been discussed with the patient and family. After consideration of risks, benefits and other options for treatment, the patient has consented to  Procedure(s): [facility] LEFT HEART CATHETERIZATION WITH CORONARY ANGIOGRAM (N/A) as a surgical intervention .  The patient's history has been reviewed, patient examined, no change in status, stable for surgery.  I have reviewed the patient's chart and labs.  Questions were answered to the patient's satisfaction.     Charlton Haws

## 2012-12-18 ENCOUNTER — Ambulatory Visit (INDEPENDENT_AMBULATORY_CARE_PROVIDER_SITE_OTHER): Payer: BC Managed Care – PPO | Admitting: Cardiology

## 2012-12-18 ENCOUNTER — Encounter: Payer: Self-pay | Admitting: Cardiology

## 2012-12-18 VITALS — BP 144/76 | HR 55 | Ht 68.0 in | Wt 188.0 lb

## 2012-12-18 DIAGNOSIS — E7800 Pure hypercholesterolemia, unspecified: Secondary | ICD-10-CM

## 2012-12-18 DIAGNOSIS — N400 Enlarged prostate without lower urinary tract symptoms: Secondary | ICD-10-CM | POA: Insufficient documentation

## 2012-12-18 DIAGNOSIS — I119 Hypertensive heart disease without heart failure: Secondary | ICD-10-CM

## 2012-12-18 DIAGNOSIS — I259 Chronic ischemic heart disease, unspecified: Secondary | ICD-10-CM

## 2012-12-18 DIAGNOSIS — C61 Malignant neoplasm of prostate: Secondary | ICD-10-CM | POA: Diagnosis not present

## 2012-12-18 MED ORDER — LISINOPRIL 10 MG PO TABS: 10.0000 mg | ORAL_TABLET | Freq: Every day | ORAL | Status: DC

## 2012-12-18 NOTE — Assessment & Plan Note (Signed)
The patient has had no recurrence of his chest pain since his cardiac catheterization.  In retrospect he thinks some of his exertional dyspnea was from being out of shape.  He is trying to walk on the treadmill more faithfully now.

## 2012-12-18 NOTE — Assessment & Plan Note (Signed)
Patient has a history of hypercholesterolemia and is on simvastatin.  He will continue current therapy.  He is not having any side effects from the medication.  We will recheck him for lipids in about 4 months

## 2012-12-18 NOTE — Patient Instructions (Addendum)
ADD LISINOPRIL 10 MG DAILY, RX SENT TO [facility]  Your physician wants you to follow-up in: 4 months with fasting labs (lp/bmet/hfp) You will receive a reminder letter in the mail two months in advance. If you don't receive a letter, please call our office to schedule the follow-up appointment.

## 2012-12-18 NOTE — Progress Notes (Signed)
Kurt Diaz Date of Birth:  [birth year] [facility] [address] [place], [place]  [address] [phone]  Fax   [phone]  HPI: This pleasant 72 year old gentleman is seen for a scheduled followup office visit.  He has a past history of known ischemic heart disease. He had acute coronary syndrome in 2006 he had [place]ope [place] airport and was found to be pulseless and underwent CPR and then underwent emergency cardiac catheterization and had intervention with 2 vessel disease being found. He had angioplasty and stenting to his LAD and angioplasty and stenting of his right coronary artery. His last nuclear stress test January 2011 showed a small area of reversible ischemia and as a result he underwent cardiac catheterization by Dr. Elease Hashimoto on 10/13/2009 which showed that the stents were widely patent. There was a proximal 70% stenosis in the diagonal which was too small for PCI. He has been doing well on medical therapy until about the past 3 weeks when these symptoms started.  The patient was seen in the office on 11/25/12 as a work in complaining of exertional dyspnea and some intermittent chest discomfort and left jaw discomfort.  For this reason we arranged for repeat cardiac catheterization which was done by Dr. Charlton Haws on 11/30/12 and it showed that his previous stents were widely patent and no new lesions were seen.  Continued medical management was advised.   Current Outpatient Prescriptions  Medication Sig Dispense Refill  . aspirin 81 MG tablet Take 81 mg by mouth daily.      . nitroGLYCERIN (NITROSTAT) 0.4 MG SL tablet Place 0.4 mg under the tongue every 5 (five) minutes as needed.      . simvastatin (ZOCOR) 20 MG tablet Take 20 mg by mouth every evening.      . tadalafil (CIALIS) 10 MG tablet Take 10 mg by mouth daily as needed (bph).       . lisinopril (PRINIVIL,ZESTRIL) 10 MG tablet Take 1 tablet (10 mg total) by mouth daily.  30 tablet  5   No  current facility-administered medications for this visit.    No Known Allergies  Patient Active Problem List  Diagnosis  . Ischemic heart disease  . Hypercholesterolemia  . Prostate cancer  . BPH (benign prostatic hyperplasia)    History  Smoking status  . Never Smoker   Smokeless tobacco  . Not on file    History  Alcohol Use No    Family History  Problem Relation Age of Onset  . Heart attack Mother   . Stroke Mother   . Lung cancer Father     Review of Systems: The patient denies any heat or cold intolerance.  No weight gain or weight loss.  The patient denies headaches or blurry vision.  There is no cough or sputum production.  The patient denies dizziness.  There is no hematuria or hematochezia.  The patient denies any muscle aches or arthritis.  The patient denies any rash.  The patient denies frequent falling or instability.  There is no history of depression or anxiety.  All other systems were reviewed and are negative.   Physical Exam: Filed Vitals:   12/18/12 1603  BP: 144/76  Pulse: 55   the general appearance reveals a well-developed well-nourished gentleman in no distress.The head and neck exam reveals pupils equal and reactive.  Extraocular movements are full.  There is no scleral icterus.  The mouth and pharynx are normal.  The neck is supple.  The carotids reveal no bruits.  The jugular venous pressure is normal.  The  thyroid is not enlarged.  There is no lymphadenopathy.  The chest is clear to percussion and auscultation.  There are no rales or rhonchi.  Expansion of the chest is symmetrical.  The precordium is quiet.  The first heart sound is normal.  The second heart sound is physiologically split.  There is no murmur gallop rub or click.  There is no abnormal lift or heave.  The abdomen is soft and nontender.  The bowel sounds are normal.  The liver and spleen are not enlarged.  There are no abdominal masses.  There are no abdominal bruits.  Extremities  reveal good pedal pulses.  There is no phlebitis or edema.  There is no cyanosis or clubbing.  Strength is normal and symmetrical in all extremities.  There is no lateralizing weakness.  There are no sensory deficits.  The skin is warm and dry.  There is no rash.  EKG shows sinus bradycardia and is otherwise within normal limits.   Assessment / Plan: Continue same medication.  Recheck in 4 months for followup office visit and fasting lab work

## 2012-12-18 NOTE — Assessment & Plan Note (Signed)
Today in the office his blood pressure is running slightly high.  He states that when he checks it at home it is also been running slightly high.  He has normal renal function.  We will add lisinopril 10 mg daily for additional blood pressure control and for vascular protection.

## 2012-12-29 ENCOUNTER — Other Ambulatory Visit: Payer: Self-pay | Admitting: *Deleted

## 2012-12-29 MED ORDER — SIMVASTATIN 20 MG PO TABS: 20.0000 mg | ORAL_TABLET | Freq: Every evening | ORAL | Status: DC

## 2012-12-30 ENCOUNTER — Other Ambulatory Visit: Payer: Self-pay | Admitting: *Deleted

## 2013-04-19 ENCOUNTER — Encounter: Payer: Self-pay | Admitting: Cardiology

## 2013-04-19 ENCOUNTER — Other Ambulatory Visit (INDEPENDENT_AMBULATORY_CARE_PROVIDER_SITE_OTHER): Payer: BC Managed Care – PPO

## 2013-04-19 ENCOUNTER — Ambulatory Visit (INDEPENDENT_AMBULATORY_CARE_PROVIDER_SITE_OTHER): Payer: BC Managed Care – PPO | Admitting: Cardiology

## 2013-04-19 VITALS — BP 116/68 | HR 64 | Ht 68.0 in | Wt 184.0 lb

## 2013-04-19 DIAGNOSIS — E7800 Pure hypercholesterolemia, unspecified: Secondary | ICD-10-CM

## 2013-04-19 DIAGNOSIS — I259 Chronic ischemic heart disease, unspecified: Secondary | ICD-10-CM | POA: Diagnosis not present

## 2013-04-19 DIAGNOSIS — I119 Hypertensive heart disease without heart failure: Secondary | ICD-10-CM

## 2013-04-19 LAB — BASIC METABOLIC PANEL
BUN: 15 mg/dL (ref 6–23)
CO2: 32 mEq/L (ref 19–32)
Calcium: 9.2 mg/dL (ref 8.4–10.5)
Chloride: 103 mEq/L (ref 96–112)
Creatinine, Ser: 1.1 mg/dL (ref 0.4–1.5)
GFR: 71.45 mL/min (ref >60.00)
Glucose, Bld: 106 mg/dL — ABNORMAL HIGH (ref 70–99)
Potassium: 4.2 mEq/L (ref 3.5–5.1)
Sodium: 138 mEq/L (ref 135–145)

## 2013-04-19 LAB — LIPID PANEL
Cholesterol: 142 mg/dL (ref 0–200)
HDL: 35.8 mg/dL — ABNORMAL LOW (ref >39.00)
LDL Cholesterol: 84 mg/dL (ref 0–99)
Total CHOL/HDL Ratio: 4
Triglycerides: 112 mg/dL (ref 0.0–149.0)
VLDL: 22.4 mg/dL (ref 0.0–40.0)

## 2013-04-19 LAB — HEPATIC FUNCTION PANEL
ALT: 16 U/L (ref 0–53)
AST: 17 U/L (ref 0–37)
Albumin: 4.2 g/dL (ref 3.5–5.2)
Alkaline Phosphatase: 39 U/L (ref 39–117)
Bilirubin, Direct: 0.1 mg/dL (ref 0.0–0.3)
Total Bilirubin: 1 mg/dL (ref 0.3–1.2)
Total Protein: 7.3 g/dL (ref 6.0–8.3)

## 2013-04-19 NOTE — Assessment & Plan Note (Signed)
The patient is not having any exertional chest pain or shortness of breath

## 2013-04-19 NOTE — Patient Instructions (Signed)
Your physician recommends that you return for lab work in: 6 months ( just prior to seeing Dr. Patty Sermons) lipid/liver/bmp  Your physician wants you to follow-up in: 6 months with Dr. Patty Sermons. You will receive a reminder letter in the mail two months in advance. If you don't receive a letter, please call our office to schedule the follow-up appointment.  Your physician recommends that you continue on your current medications as directed. Please refer to the Current Medication list given to you today.

## 2013-04-19 NOTE — Assessment & Plan Note (Signed)
The patient has a history of hypercholesterolemia and is tolerating simvastatin 20 mg daily without side effects.  Blood work today pending.

## 2013-04-19 NOTE — Progress Notes (Signed)
Quick Note:  Please report to patient. The recent labs are stable. Continue same medication and careful diet. BS higher. Watch sweets ______ 

## 2013-04-19 NOTE — Assessment & Plan Note (Signed)
Blood pressure remaining stable on current therapy 

## 2013-04-19 NOTE — Progress Notes (Signed)
[name] Date of Birth:  1940/12/10 [facility] [address] [place], [place]  [address] [phone]  Fax   [phone]  HPI: This pleasant 72 year old gentleman is seen for a scheduled followup office visit.  He has a past history of known ischemic heart disease. He had acute coronary syndrome in 2006 he had [place]ope [place] airport and was found to be pulseless and underwent CPR and then underwent emergency cardiac catheterization and had intervention with 2 vessel disease being found. He had angioplasty and stenting to his LAD and angioplasty and stenting of his right coronary artery. His last nuclear stress test January 2011 showed a small area of reversible ischemia and as a result he underwent cardiac catheterization by Dr. Elease Hashimoto on 10/13/2009 which showed that the stents were widely patent. There was a proximal 70% stenosis in the diagonal which was too small for PCI. He has been doing well on medical therapy until about the past 3 weeks when these symptoms started. The patient was seen in the office on 11/25/12 as a work in complaining of exertional dyspnea and some intermittent chest discomfort and left jaw discomfort. For this reason we arranged for repeat cardiac catheterization which was done by Dr. Charlton Haws on 11/30/12 and it showed that his previous stents were widely patent and no new lesions were seen. Continued medical management was advised. Since then the patient has had no further significant chest discomfort.  Since last visit he has had only one brief episode which occurred at night at rest not associated with any other symptoms and went away within a few minutes spontaneously without specific therapy. The patient still works full time.  He does not get much regular exercise   Current Outpatient Prescriptions  Medication Sig Dispense Refill  . aspirin 81 MG tablet Take 81 mg by mouth daily.      Marland Kitchen lisinopril (PRINIVIL,ZESTRIL) 10 MG  tablet Take 1 tablet (10 mg total) by mouth daily.  30 tablet  5  . nitroGLYCERIN (NITROSTAT) 0.4 MG SL tablet Place 0.4 mg under the tongue every 5 (five) minutes as needed.      . simvastatin (ZOCOR) 20 MG tablet Take 1 tablet (20 mg total) by mouth every evening.  30 tablet  5  . tadalafil (CIALIS) 10 MG tablet Take 10 mg by mouth daily as needed (bph).        No current facility-administered medications for this visit.    No Known Allergies  Patient Active Problem List   Diagnosis Date Noted  . Prostate cancer 12/18/2012  . BPH (benign prostatic hyperplasia) 12/18/2012  . Benign hypertensive heart disease without heart failure 12/18/2012  . Ischemic heart disease 05/03/2011  . Hypercholesterolemia 05/03/2011    History  Smoking status  . Never Smoker   Smokeless tobacco  . Not on file    History  Alcohol Use No    Family History  Problem Relation Age of Onset  . Heart attack Mother   . Stroke Mother   . Lung cancer Father     Review of Systems: The patient denies any heat or cold intolerance.  No weight gain or weight loss.  The patient denies headaches or blurry vision.  There is no cough or sputum production.  The patient denies dizziness.  There is no hematuria or hematochezia.  The patient denies any muscle aches or arthritis.  The patient denies any rash.  The patient denies frequent falling or instability.  There  is no history of depression or anxiety.  All other systems were reviewed and are negative.   Physical Exam: Filed Vitals:   04/19/13 0916  BP: 116/68  Pulse: 64   General appearance reveals a well-developed well-nourished gentleman in no distress.The head and neck exam reveals pupils equal and reactive.  Extraocular movements are full.  There is no scleral icterus.  The mouth and pharynx are normal.  The neck is supple.  The carotids reveal no bruits.  The jugular venous pressure is normal.  The  thyroid is not enlarged.  There is no lymphadenopathy.   The chest is clear to percussion and auscultation.  There are no rales or rhonchi.  Expansion of the chest is symmetrical.  The precordium is quiet.  The first heart sound is normal.  The second heart sound is physiologically split.  There is no murmur gallop rub or click.  There is no abnormal lift or heave.  The abdomen is soft and nontender.  The bowel sounds are normal.  The liver and spleen are not enlarged.  There are no abdominal masses.  There are no abdominal bruits.  Extremities reveal good pedal pulses.  There is no phlebitis or edema.  There is no cyanosis or clubbing.  Strength is normal and symmetrical in all extremities.  There is no lateralizing weakness.  There are no sensory deficits.  The skin is warm and dry.  There is no rash.     Assessment / Plan: Continue same medication.  Recheck in 6 months for office visit EKG lipid panel hepatic function panel and basal metabolic panel

## 2013-04-21 ENCOUNTER — Telehealth: Payer: Self-pay | Admitting: *Deleted

## 2013-04-21 NOTE — Telephone Encounter (Signed)
Message copied by Burnell Blanks on Wed Apr 21, 2013  9:59 AM ------      Message from: Cassell Clement      Created: Mon Apr 19, 2013  9:29 PM       Please report to patient.  The recent labs are stable. Continue same medication and careful diet. BS higher. Watch sweets. ------

## 2013-04-21 NOTE — Telephone Encounter (Signed)
Mailed copy of labs and left message to call if any questions  

## 2013-07-02 ENCOUNTER — Other Ambulatory Visit: Payer: Self-pay

## 2013-07-02 MED ORDER — LISINOPRIL 10 MG PO TABS: 10.0000 mg | ORAL_TABLET | Freq: Every day | ORAL | Status: DC

## 2013-07-20 ENCOUNTER — Other Ambulatory Visit: Payer: Self-pay

## 2013-07-20 MED ORDER — SIMVASTATIN 20 MG PO TABS: 20.0000 mg | ORAL_TABLET | Freq: Every evening | ORAL | Status: DC

## 2013-11-12 ENCOUNTER — Ambulatory Visit (INDEPENDENT_AMBULATORY_CARE_PROVIDER_SITE_OTHER): Payer: BC Managed Care – PPO | Admitting: Cardiology

## 2013-11-12 ENCOUNTER — Encounter: Payer: Self-pay | Admitting: Cardiology

## 2013-11-12 ENCOUNTER — Other Ambulatory Visit (INDEPENDENT_AMBULATORY_CARE_PROVIDER_SITE_OTHER): Payer: BC Managed Care – PPO

## 2013-11-12 VITALS — BP 148/74 | HR 65 | Ht 68.0 in | Wt 184.0 lb

## 2013-11-12 DIAGNOSIS — I119 Hypertensive heart disease without heart failure: Secondary | ICD-10-CM

## 2013-11-12 DIAGNOSIS — E7800 Pure hypercholesterolemia, unspecified: Secondary | ICD-10-CM

## 2013-11-12 DIAGNOSIS — I259 Chronic ischemic heart disease, unspecified: Secondary | ICD-10-CM

## 2013-11-12 LAB — LIPID PANEL
Cholesterol: 129 mg/dL (ref 0–200)
HDL: 35.6 mg/dL — ABNORMAL LOW (ref >39.00)
LDL Cholesterol: 78 mg/dL (ref 0–99)
Total CHOL/HDL Ratio: 4
Triglycerides: 79 mg/dL (ref 0.0–149.0)
VLDL: 15.8 mg/dL (ref 0.0–40.0)

## 2013-11-12 LAB — HEPATIC FUNCTION PANEL
ALT: 18 U/L (ref 0–53)
AST: 18 U/L (ref 0–37)
Albumin: 3.9 g/dL (ref 3.5–5.2)
Alkaline Phosphatase: 39 U/L (ref 39–117)
Bilirubin, Direct: 0 mg/dL (ref 0.0–0.3)
Total Bilirubin: 0.6 mg/dL (ref 0.3–1.2)
Total Protein: 6.8 g/dL (ref 6.0–8.3)

## 2013-11-12 LAB — BASIC METABOLIC PANEL
BUN: 15 mg/dL (ref 6–23)
CO2: 28 mEq/L (ref 19–32)
Calcium: 8.8 mg/dL (ref 8.4–10.5)
Chloride: 105 mEq/L (ref 96–112)
Creatinine, Ser: 0.9 mg/dL (ref 0.4–1.5)
GFR: 83.73 mL/min (ref >60.00)
Glucose, Bld: 100 mg/dL — ABNORMAL HIGH (ref 70–99)
Potassium: 4.1 mEq/L (ref 3.5–5.1)
Sodium: 138 mEq/L (ref 135–145)

## 2013-11-12 NOTE — Assessment & Plan Note (Signed)
Blood pressure at home has been normal.  Today on arrival it was elevated.  On repeat was down to 130/80.  Continue same medication.

## 2013-11-12 NOTE — Progress Notes (Signed)
Kurt Diaz Date of Birth:  [birth year] [address] [place], [place]  [address] [phone]  Fax   [phone]  HPI: This pleasant 73 year old gentleman is seen for a scheduled followup office visit.  He has a past history of known ischemic heart disease. He had acute coronary syndrome in 2006 he had syncope [place] the [place] airport and was found to be pulseless and underwent CPR and then underwent emergency cardiac catheterization and had intervention with 2 vessel disease being found. He had angioplasty and stenting to his LAD and angioplasty and stenting of his right coronary artery. His last nuclear stress test January 2011 showed a small area of reversible ischemia and as a result he underwent cardiac catheterization by Dr. Acie Fredrickson on 10/13/2009 which showed that the stents were widely patent. There was a proximal 70% stenosis in the diagonal which was too small for PCI. He has been doing well on medical therapy until about the past 3 weeks when these symptoms started. The patient was seen in the office on 11/25/12 as a work in complaining of exertional dyspnea and some intermittent chest discomfort and left jaw discomfort. For this reason we arranged for repeat cardiac catheterization which was done by Dr. Jenkins Rouge on 11/30/12 and it showed that his previous stents were widely patent and no new lesions were seen. Continued medical management was advised. Since last visit the patient has felt well with no new cardiac symptoms.  Current Outpatient Prescriptions  Medication Sig Dispense Refill  . aspirin 81 MG tablet Take 81 mg by mouth daily.      Marland Kitchen lisinopril (PRINIVIL,ZESTRIL) 10 MG tablet Take 1 tablet (10 mg total) by mouth daily.  30 tablet  5  . nitroGLYCERIN (NITROSTAT) 0.4 MG SL tablet Place 0.4 mg under the tongue every 5 (five) minutes as needed.      . simvastatin (ZOCOR) 20 MG tablet Take 1 tablet (20 mg total) by mouth every evening.  30 tablet  5  .  tadalafil (CIALIS) 10 MG tablet Take 10 mg by mouth daily as needed (bph).        No current facility-administered medications for this visit.    No Known Allergies  Patient Active Problem List   Diagnosis Date Noted  . Prostate cancer 12/18/2012  . BPH (benign prostatic hyperplasia) 12/18/2012  . Benign hypertensive heart disease without heart failure 12/18/2012  . Ischemic heart disease 05/03/2011  . Hypercholesterolemia 05/03/2011    History  Smoking status  . Never Smoker   Smokeless tobacco  . Not on file    History  Alcohol Use No    Family History  Problem Relation Age of Onset  . Heart attack Mother   . Stroke Mother   . Lung cancer Father     Review of Systems: The patient denies any heat or cold intolerance.  No weight gain or weight loss.  The patient denies headaches or blurry vision.  There is no cough or sputum production.  The patient denies dizziness.  There is no hematuria or hematochezia.  The patient denies any muscle aches or arthritis.  The patient denies any rash.  The patient denies frequent falling or instability.  There is no history of depression or anxiety.  All other systems were reviewed and are negative.   Physical Exam: Filed Vitals:   11/12/13 1037  BP: 148/74  Pulse: 65   General appearance reveals a well-developed well-nourished gentleman in no distress.The head and neck  exam reveals pupils equal and reactive.  Extraocular movements are full.  There is no scleral icterus.  The mouth and pharynx are normal.  The neck is supple.  The carotids reveal no bruits.  The jugular venous pressure is normal.  The  thyroid is not enlarged.  There is no lymphadenopathy.  The chest is clear to percussion and auscultation.  There are no rales or rhonchi.  Expansion of the chest is symmetrical.  The precordium is quiet.  The first heart sound is normal.  The second heart sound is physiologically split.  There is no murmur gallop rub or click.  There is no  abnormal lift or heave.  The abdomen is soft and nontender.  The bowel sounds are normal.  The liver and spleen are not enlarged.  There are no abdominal masses.  There are no abdominal bruits.  Extremities reveal good pedal pulses.  There is no phlebitis or edema.  There is no cyanosis or clubbing.  Strength is normal and symmetrical in all extremities.  There is no lateralizing weakness.  There are no sensory deficits.  The skin is warm and dry.  There is no rash.  EKG today shows sinus bradycardia and is within normal limits.  No ischemic changes.   Assessment / Plan: The patient is doing well.  I encouraged him to continue regular aerobic exercise.  Continue same medication.  Recheck in 6 months for office visit  lipid panel hepatic function panel and basal metabolic panel

## 2013-11-12 NOTE — Assessment & Plan Note (Signed)
The patient has been doing some exercises at home.  He has not been experiencing any chest pain.

## 2013-11-12 NOTE — Patient Instructions (Signed)
Your physician recommends that you continue on your current medications as directed. Please refer to the Current Medication list given to you today.  Your physician wants you to follow-up in: 6 months with fasting labs (lp/bmet/hfp)  You will receive a reminder letter in the mail two months in advance. If you don't receive a letter, please call our office to schedule the follow-up appointment.  

## 2013-11-12 NOTE — Assessment & Plan Note (Signed)
The patient remains on simvastatin for his hypercholesterolemia and his known coronary disease.  He has not been having any myalgias.  We are checking fasting lab work today.

## 2013-11-14 NOTE — Progress Notes (Signed)
Quick Note:  Please report to patient. The recent labs are stable. Continue same medication and careful diet. ______ 

## 2013-11-15 ENCOUNTER — Telehealth: Payer: Self-pay | Admitting: *Deleted

## 2013-11-15 NOTE — Telephone Encounter (Signed)
Message copied by Earvin Hansen on Mon Nov 15, 2013 12:47 PM ------      Message from: Darlin Coco      Created: Sun Nov 14, 2013  8:27 PM       Please report to patient.  The recent labs are stable. Continue same medication and careful diet. ------

## 2013-11-15 NOTE — Telephone Encounter (Signed)
Mailed copy of labs and left message to call if any questions  

## 2013-12-17 ENCOUNTER — Other Ambulatory Visit: Payer: Self-pay | Admitting: *Deleted

## 2013-12-17 MED ORDER — LISINOPRIL 10 MG PO TABS: 10.0000 mg | ORAL_TABLET | Freq: Every day | ORAL | Status: DC

## 2013-12-17 MED ORDER — SIMVASTATIN 20 MG PO TABS: 20.0000 mg | ORAL_TABLET | Freq: Every evening | ORAL | Status: DC

## 2014-05-07 ENCOUNTER — Other Ambulatory Visit: Payer: Self-pay | Admitting: Cardiology

## 2014-06-13 ENCOUNTER — Other Ambulatory Visit (INDEPENDENT_AMBULATORY_CARE_PROVIDER_SITE_OTHER): Payer: BC Managed Care – PPO

## 2014-06-13 ENCOUNTER — Ambulatory Visit (INDEPENDENT_AMBULATORY_CARE_PROVIDER_SITE_OTHER): Payer: BC Managed Care – PPO | Admitting: Cardiology

## 2014-06-13 ENCOUNTER — Encounter: Payer: Self-pay | Admitting: Cardiology

## 2014-06-13 VITALS — BP 130/78 | HR 60 | Ht 68.0 in | Wt 187.0 lb

## 2014-06-13 DIAGNOSIS — I119 Hypertensive heart disease without heart failure: Secondary | ICD-10-CM | POA: Diagnosis not present

## 2014-06-13 DIAGNOSIS — E7800 Pure hypercholesterolemia, unspecified: Secondary | ICD-10-CM

## 2014-06-13 DIAGNOSIS — I259 Chronic ischemic heart disease, unspecified: Secondary | ICD-10-CM | POA: Diagnosis not present

## 2014-06-13 LAB — BASIC METABOLIC PANEL
BUN: 18 mg/dL (ref 6–23)
CO2: 33 mEq/L — ABNORMAL HIGH (ref 19–32)
Calcium: 9.5 mg/dL (ref 8.4–10.5)
Chloride: 102 mEq/L (ref 96–112)
Creatinine, Ser: 1.1 mg/dL (ref 0.4–1.5)
GFR: 71.99 mL/min (ref >60.00)
Glucose, Bld: 99 mg/dL (ref 70–99)
Potassium: 4.5 mEq/L (ref 3.5–5.1)
Sodium: 139 mEq/L (ref 135–145)

## 2014-06-13 LAB — HEPATIC FUNCTION PANEL
ALT: 17 U/L (ref 0–53)
AST: 19 U/L (ref 0–37)
Albumin: 4.3 g/dL (ref 3.5–5.2)
Alkaline Phosphatase: 43 U/L (ref 39–117)
Bilirubin, Direct: 0.1 mg/dL (ref 0.0–0.3)
Total Bilirubin: 1.1 mg/dL (ref 0.2–1.2)
Total Protein: 7.6 g/dL (ref 6.0–8.3)

## 2014-06-13 LAB — LIPID PANEL
Cholesterol: 135 mg/dL (ref 0–200)
HDL: 33.2 mg/dL — ABNORMAL LOW (ref >39.00)
LDL Cholesterol: 81 mg/dL (ref 0–99)
NonHDL: 101.8
Total CHOL/HDL Ratio: 4
Triglycerides: 105 mg/dL (ref 0.0–149.0)
VLDL: 21 mg/dL (ref 0.0–40.0)

## 2014-06-13 NOTE — Assessment & Plan Note (Signed)
The patient has not had any recurrent chest discomfort severe enough to try any nitroglycerin

## 2014-06-13 NOTE — Assessment & Plan Note (Signed)
The patient is not having any myalgias from his statin therapy.  He is having arthralgias of the knees secondary to osteoarthritis.  Blood work today is pending

## 2014-06-13 NOTE — Assessment & Plan Note (Signed)
Blood pressures remained stable on current therapy.  No symptoms of congestive heart failure.  No exertional chest pain or dyspnea.  He has an exercise bike which he has started to use.

## 2014-06-13 NOTE — Progress Notes (Signed)
Kurt Diaz Date of Birth:  April 25, 1941 [facility] [address] [place], [place]  [address] [phone]  Fax   [phone]  HPI: This pleasant 73 year old gentleman is seen for a scheduled followup office visit.  He has a past history of known ischemic heart disease. He had acute coronary syndrome in 2006 when he had [place]ope [place] airport and was found to be pulseless and underwent CPR and then underwent emergency cardiac catheterization and had intervention with 2 vessel disease being found. He had angioplasty and stenting to his LAD and angioplasty and stenting of his right coronary artery. His last nuclear stress test January 2011 showed a small area of reversible ischemia and as a result he underwent cardiac catheterization by Dr. Acie Fredrickson on 10/13/2009 which showed that the stents were widely patent. There was a proximal 70% stenosis in the diagonal which was too small for PCI. He has been doing well on medical therapy until about the past 3 weeks when these symptoms started. The patient was seen in the office on 11/25/12 as a work in complaining of exertional dyspnea and some intermittent chest discomfort and left jaw discomfort. For this reason we arranged for repeat cardiac catheterization which was done by Dr. Jenkins Rouge on 11/30/12 and it showed that his previous stents were widely patent and no new lesions were seen. Continued medical management was advised.  Since last visit the patient has felt well with no new cardiac symptoms.  He has had a one episode of mild jaw and chest discomfort occurring at rest not severe enough to try a nitroglycerin.   Current Outpatient Prescriptions  Medication Sig Dispense Refill  . aspirin 81 MG tablet Take 81 mg by mouth daily.      Marland Kitchen lisinopril (PRINIVIL,ZESTRIL) 10 MG tablet TAKE 1 TABLET DAILY  90 tablet  0  . nitroGLYCERIN (NITROSTAT) 0.4 MG SL tablet Place 0.4 mg under the tongue every 5 (five) minutes as  needed.      . simvastatin (ZOCOR) 20 MG tablet TAKE 1 TABLET EVERY EVENING  90 tablet  0  . tadalafil (CIALIS) 10 MG tablet Take 10 mg by mouth daily as needed (bph).        No current facility-administered medications for this visit.    No Known Allergies  Patient Active Problem List   Diagnosis Date Noted  . Prostate cancer 12/18/2012  . BPH (benign prostatic hyperplasia) 12/18/2012  . Benign hypertensive heart disease without heart failure 12/18/2012  . Ischemic heart disease 05/03/2011  . Hypercholesterolemia 05/03/2011    History  Smoking status  . Never Smoker   Smokeless tobacco  . Not on file    History  Alcohol Use No    Family History  Problem Relation Age of Onset  . Heart attack Mother   . Stroke Mother   . Lung cancer Father     Review of Systems: The patient denies any heat or cold intolerance.  No weight gain or weight loss.  The patient denies headaches or blurry vision.  There is no cough or sputum production.  The patient denies dizziness.  There is no hematuria or hematochezia.  The patient denies any muscle aches or arthritis.  The patient denies any rash.  The patient denies frequent falling or instability.  There is no history of depression or anxiety.  All other systems were reviewed and are negative.   Physical Exam: Filed Vitals:   06/13/14 1015  BP:  130/78  Pulse: 60  The patient appears to be in no distress.  Head and neck exam reveals that the pupils are equal and reactive.  The extraocular movements are full.  There is no scleral icterus.  Mouth and pharynx are benign.  No lymphadenopathy.  No carotid bruits.  The jugular venous pressure is normal.  Thyroid is not enlarged or tender.  Chest is clear to percussion and auscultation.  No rales or rhonchi.  Expansion of the chest is symmetrical.  Heart reveals no abnormal lift or heave.  First and second heart sounds are normal.  There is no murmur gallop rub or click.  The abdomen is  soft and nontender.  Bowel sounds are normoactive.  There is no hepatosplenomegaly or mass.  There are no abdominal bruits.  Extremities reveal no phlebitis or edema.  Pedal pulses are good.  There is no cyanosis or clubbing.  Neurologic exam is normal strength and no lateralizing weakness.  No sensory deficits.  Integument reveals no rash     Assessment / Plan: 1. ischemic heart disease status post cardiac arrest and resuscitation in 2006 followed by angioplasty and stenting of his LAD and right coronary artery.  Subsequent cardiac catheterizations in 2011 and in March 2014 showed that the stents are widely patent with no new lesions seen. 2. Hypercholesterolemia 3. essential hypertension  Plan: Continue same medication.  Blood work today is pending.  He has gained 3 pounds since last visit.  Work harder on diet. Recheck in 6 months for office visit EKG lipid panel hepatic function panel and basal metabolic panel.

## 2014-06-13 NOTE — Patient Instructions (Addendum)
Your physician recommends that you continue on your current medications as directed. Please refer to the Current Medication list given to you today.  Your physician wants you to follow-up in: 6 months with fasting labs (lp/bmet/hfp)  You will receive a reminder letter in the mail two months in advance. If you don't receive a letter, please call our office to schedule the follow-up appointment.  

## 2014-06-14 NOTE — Progress Notes (Signed)
Quick Note:  Please report to patient. The recent labs are stable. Continue same medication and careful diet. ______ 

## 2014-07-12 DIAGNOSIS — Z23 Encounter for immunization: Secondary | ICD-10-CM | POA: Diagnosis not present

## 2014-07-19 ENCOUNTER — Other Ambulatory Visit: Payer: Self-pay | Admitting: Cardiology

## 2014-12-30 ENCOUNTER — Ambulatory Visit (INDEPENDENT_AMBULATORY_CARE_PROVIDER_SITE_OTHER): Payer: BLUE CROSS/BLUE SHIELD | Admitting: Cardiology

## 2014-12-30 ENCOUNTER — Other Ambulatory Visit (INDEPENDENT_AMBULATORY_CARE_PROVIDER_SITE_OTHER): Payer: BLUE CROSS/BLUE SHIELD | Admitting: *Deleted

## 2014-12-30 ENCOUNTER — Encounter: Payer: Self-pay | Admitting: Cardiology

## 2014-12-30 VITALS — BP 138/76 | HR 61 | Ht 68.0 in | Wt 182.8 lb

## 2014-12-30 DIAGNOSIS — I119 Hypertensive heart disease without heart failure: Secondary | ICD-10-CM | POA: Diagnosis not present

## 2014-12-30 DIAGNOSIS — E7800 Pure hypercholesterolemia, unspecified: Secondary | ICD-10-CM

## 2014-12-30 DIAGNOSIS — I259 Chronic ischemic heart disease, unspecified: Secondary | ICD-10-CM | POA: Diagnosis not present

## 2014-12-30 LAB — HEPATIC FUNCTION PANEL
ALT: 19 U/L (ref 0–53)
AST: 20 U/L (ref 0–37)
Albumin: 4.1 g/dL (ref 3.5–5.2)
Alkaline Phosphatase: 42 U/L (ref 39–117)
Bilirubin, Direct: 0.2 mg/dL (ref 0.0–0.3)
Total Bilirubin: 0.8 mg/dL (ref 0.2–1.2)
Total Protein: 7.1 g/dL (ref 6.0–8.3)

## 2014-12-30 LAB — BASIC METABOLIC PANEL
BUN: 19 mg/dL (ref 6–23)
CO2: 27 mEq/L (ref 19–32)
Calcium: 9.4 mg/dL (ref 8.4–10.5)
Chloride: 103 mEq/L (ref 96–112)
Creatinine, Ser: 0.95 mg/dL (ref 0.40–1.50)
GFR: 82.45 mL/min (ref >60.00)
Glucose, Bld: 109 mg/dL — ABNORMAL HIGH (ref 70–99)
Potassium: 4 mEq/L (ref 3.5–5.1)
Sodium: 137 mEq/L (ref 135–145)

## 2014-12-30 LAB — LIPID PANEL
Cholesterol: 135 mg/dL (ref 0–200)
HDL: 35.3 mg/dL — ABNORMAL LOW (ref >39.00)
LDL Cholesterol: 79 mg/dL (ref 0–99)
NonHDL: 99.7
Total CHOL/HDL Ratio: 4
Triglycerides: 103 mg/dL (ref 0.0–149.0)
VLDL: 20.6 mg/dL (ref 0.0–40.0)

## 2014-12-30 NOTE — Addendum Note (Signed)
Addended by: Eulis Foster on: 12/30/2014 07:41 AM   Modules accepted: Orders

## 2014-12-30 NOTE — Patient Instructions (Signed)
Medication Instructions:  Your physician recommends that you continue on your current medications as directed. Please refer to the Current Medication list given to you today.  Labwork: NONE  Testing/Procedures: NONE  Follow-Up: Your physician wants you to follow-up in: 6 months with fasting labs (lp/bmet/hfp)  You will receive a reminder letter in the mail two months in advance. If you don't receive a letter, please call our office to schedule the follow-up appointment.     

## 2014-12-30 NOTE — Progress Notes (Signed)
Cardiology Office Note   Date:  12/30/2014   ID:  Kurt Diaz, DOB Jul 27, 1941, MRN [id number]  PCP:  [name], MD  Cardiologist:   Darlin Coco, MD   No chief complaint on file.     History of Present Illness: Kurt Diaz is a 74 y.o. male who presents for a six-month follow-up office visit  This pleasant 74 year old gentleman is seen for a scheduled followup office visit.  He has a past history of known ischemic heart disease. He had acute coronary syndrome in 2006 when he had [place] and was found to be pulseless and underwent CPR and then underwent emergency cardiac catheterization and had intervention with 2 vessel disease being found. He had angioplasty and stenting to his LAD and angioplasty and stenting of his right coronary artery. His last nuclear stress test January 2011 showed a small area of reversible ischemia and as a result he underwent cardiac catheterization by Dr. Acie Fredrickson on 10/13/2009 which showed that the stents were widely patent. There was a proximal 70% stenosis in the diagonal which was too small for PCI. He has been doing well on medical therapy until about the past 3 weeks when these symptoms started. The patient was seen in the office on 11/25/12 as a work in complaining of exertional dyspnea and some intermittent chest discomfort and left jaw discomfort. For this reason we arranged for repeat cardiac catheterization which was done by Dr. Jenkins Rouge on 11/30/12 and it showed that his previous stents were widely patent and no new lesions were seen. Continued medical management was advised.  Since last visit the patient has felt well with no new cardiac symptoms.He has lost weight.  He has an exercise bicycle at home.  He has not had to take any sublingual nitroglycerin since last visit  Past Medical History  Diagnosis Date  . Coronary syndrome, acute 2006  . Chest pain   . Hyperlipidemia   . Coronary artery disease   .  Syncope and collapse     Past Surgical History  Procedure Laterality Date  . Cardiac catheterization  10/13/2009    EF 65%  . Coronary angioplasty      STENTING OF HIS LAD, STENTING OF HIS RIGHT CORONARY.  Marland Kitchen US echocardiography  12/15/2009    EF 55-60%  . Cardiovascular stress test  09/29/2009    EF 70%     Current Outpatient Prescriptions  Medication Sig Dispense Refill  . aspirin 81 MG tablet Take 81 mg by mouth daily.    Marland Kitchen lisinopril (PRINIVIL,ZESTRIL) 10 MG tablet Take 1 tablet (10 mg total) by mouth daily. 90 tablet 2  . nitroGLYCERIN (NITROSTAT) 0.4 MG SL tablet Place 0.4 mg under the tongue every 5 (five) minutes as needed.    . simvastatin (ZOCOR) 20 MG tablet Take 1 tablet (20 mg total) by mouth daily at 6 PM. 90 tablet 2   No current facility-administered medications for this visit.    Allergies:   Review of patient's allergies indicates no known allergies.    Social History:  The patient  reports that he has never smoked. He does not have any smokeless tobacco history on file. He reports that he does not drink alcohol or use illicit drugs.   Family History:  The patient's family history includes Heart attack in his mother; Lung cancer in his father; Stroke in his mother.    ROS:  Please see the history of present illness.  Otherwise, review of systems are positive for none.   All other systems are reviewed and negative.    PHYSICAL EXAM: VS:  BP 138/76 mmHg  Pulse 61  Ht 5\' 8"  (1.727 m)  Wt 182 lb 12.8 oz (82.918 kg)  BMI 27.80 kg/m2 , BMI Body mass index is 27.8 kg/(m^2). GEN: Well nourished, well developed, in no acute distress HEENT: normal Neck: no JVD, carotid bruits, or masses Cardiac: RRR; no murmurs, rubs, or gallops,no edema  Respiratory:  clear to auscultation bilaterally, normal work of breathing GI: soft, nontender, nondistended, + BS MS: no deformity or atrophy Skin: warm and dry, no rash Neuro:  Strength and sensation are intact Psych:  euthymic mood, full affect   EKG:  EKG is ordered today. The ekg ordered today demonstrates normal sinus rhythm.  Within normal limits.   Recent Labs: 06/13/2014: ALT 17; BUN 18; Creatinine 1.1; Potassium 4.5; Sodium 139    Lipid Panel    Component Value Date/Time   CHOL 135 06/13/2014 0827   TRIG 105.0 06/13/2014 0827   HDL 33.20* 06/13/2014 0827   CHOLHDL 4 06/13/2014 0827   VLDL 21.0 06/13/2014 0827   LDLCALC 81 06/13/2014 0827      Wt Readings from Last 3 Encounters:  12/30/14 182 lb 12.8 oz (82.918 kg)  06/13/14 187 lb (84.823 kg)  11/12/13 184 lb (83.462 kg)        ASSESSMENT AND PLAN:  1. ischemic heart disease status post cardiac arrest and resuscitation in 2006 followed by angioplasty and stenting of his LAD and right coronary artery. Subsequent cardiac catheterizations in 2011 and in March 2014 showed that the stents are widely patent with no new lesions seen. 2. Hypercholesterolemia 3. essential hypertension  Plan: Continue same medication. Blood work today is pending.t. Recheck in 6 months for office visit  lipid panel hepatic function panel and basal metabolic panel.   Current medicines are reviewed at length with the patient today.  The patient does not have concerns regarding medicines.  The following changes have been made:  no change  Labs/ tests ordered today include:  No orders of the defined types were placed in this encounter.        Signed, Darlin Coco, MD  12/30/2014 8:34 AM    [facility] [place], [place], [place]  [address] Phone: [phone]; Fax: [phone]

## 2015-01-01 NOTE — Progress Notes (Signed)
Quick Note:  Please report to patient. The recent labs are stable. Continue same medication and careful diet. ______ 

## 2015-01-09 ENCOUNTER — Telehealth: Payer: Self-pay | Admitting: Cardiology

## 2015-01-09 NOTE — Telephone Encounter (Signed)
Informed the pt that per Dr Mare Ferrari its ok to report to him that his recent labs are stable, and to continue same medication and careful diet.  Pt verbalized understanding and agrees with this plan.

## 2015-01-09 NOTE — Telephone Encounter (Signed)
New message ° ° °Pt calling for lab results °

## 2015-06-19 ENCOUNTER — Other Ambulatory Visit: Payer: Self-pay | Admitting: Cardiology

## 2015-07-17 ENCOUNTER — Other Ambulatory Visit: Payer: BLUE CROSS/BLUE SHIELD

## 2015-07-17 ENCOUNTER — Ambulatory Visit: Payer: BLUE CROSS/BLUE SHIELD | Admitting: Cardiology

## 2015-08-04 ENCOUNTER — Encounter: Payer: Self-pay | Admitting: Cardiology

## 2015-08-21 ENCOUNTER — Other Ambulatory Visit: Payer: BLUE CROSS/BLUE SHIELD

## 2015-08-21 ENCOUNTER — Ambulatory Visit: Payer: BLUE CROSS/BLUE SHIELD | Admitting: Cardiology

## 2015-08-25 ENCOUNTER — Other Ambulatory Visit: Payer: Self-pay | Admitting: Cardiology

## 2015-10-16 DIAGNOSIS — J029 Acute pharyngitis, unspecified: Secondary | ICD-10-CM | POA: Diagnosis not present

## 2015-10-20 ENCOUNTER — Ambulatory Visit (INDEPENDENT_AMBULATORY_CARE_PROVIDER_SITE_OTHER): Payer: BLUE CROSS/BLUE SHIELD | Admitting: Cardiology

## 2015-10-20 ENCOUNTER — Encounter: Payer: Self-pay | Admitting: Cardiology

## 2015-10-20 VITALS — BP 144/66 | HR 82 | Ht 68.0 in | Wt 183.8 lb

## 2015-10-20 DIAGNOSIS — I259 Chronic ischemic heart disease, unspecified: Secondary | ICD-10-CM

## 2015-10-20 DIAGNOSIS — E7800 Pure hypercholesterolemia, unspecified: Secondary | ICD-10-CM

## 2015-10-20 DIAGNOSIS — I119 Hypertensive heart disease without heart failure: Secondary | ICD-10-CM | POA: Diagnosis not present

## 2015-10-20 NOTE — Progress Notes (Signed)
Cardiology Office Note   Date:  10/20/2015   ID:  Kurt Diaz, DOB 01/05/1941, MRN [id number]  PCP:  [name], MD  Cardiologist: Darlin Coco MD  Chief Complaint  Patient presents with  . scheduled follow up    essential hypertension      History of Present Illness: Kurt Diaz is a 75 y.o. male who presents for a six-month office visit. He has a past history of known ischemic heart disease. He had acute coronary syndrome in 2006 when he had [place] and was found to be pulseless and underwent CPR and then underwent emergency cardiac catheterization and had intervention with 2 vessel disease being found. He had angioplasty and stenting to his LAD and angioplasty and stenting of his right coronary artery. His last nuclear stress test January 2011 showed a small area of reversible ischemia and as a result he underwent cardiac catheterization by Dr. Acie Fredrickson on 10/13/2009 which showed that the stents were widely patent. There was a proximal 70% stenosis in the diagonal which was too small for PCI.  The patient was seen in the office on 11/25/12 as a work in complaining of exertional dyspnea and some intermittent chest discomfort and left jaw discomfort. For this reason we arranged for repeat cardiac catheterization which was done by Dr. Jenkins Rouge on 11/30/12 and it showed that his previous stents were widely patent and no new lesions were seen. Continued medical management was advised.  Since last visit the patient has felt well with no new cardiac symptoms.He has lost weight. He has an exercise bicycle at home. He has not had to take any sublingual nitroglycerin since last visit. Currently he is getting over a respiratory illness with chest congestion and frequent coughing.  He saw his PCP about this earlier in the week.  Past Medical History  Diagnosis Date  . Coronary syndrome, acute ([place]) 2006  . Chest pain   . Hyperlipidemia   . Coronary  artery disease   . Syncope and collapse     Past Surgical History  Procedure Laterality Date  . Cardiac catheterization  10/13/2009    EF 65%  . Coronary angioplasty      STENTING OF HIS LAD, STENTING OF HIS RIGHT CORONARY.  Marland Kitchen US echocardiography  12/15/2009    EF 55-60%  . Cardiovascular stress test  09/29/2009    EF 70%     Current Outpatient Prescriptions  Medication Sig Dispense Refill  . aspirin 81 MG tablet Take 81 mg by mouth daily.    Marland Kitchen lisinopril (PRINIVIL,ZESTRIL) 10 MG tablet Take 10 mg by mouth daily.    . nitroGLYCERIN (NITROSTAT) 0.4 MG SL tablet Place 0.4 mg under the tongue every 5 (five) minutes as needed for chest pain.     . simvastatin (ZOCOR) 20 MG tablet Take 20 mg by mouth daily at 6 PM.     No current facility-administered medications for this visit.    Allergies:   Review of patient's allergies indicates no known allergies.    Social History:  The patient  reports that he has never smoked. He does not have any smokeless tobacco history on file. He reports that he does not drink alcohol or use illicit drugs.   Family History:  The patient's family history includes Heart attack in his mother; Lung cancer in his father; Stroke in his mother.    ROS:  Please see the history of present illness.   Otherwise, review of  systems are positive for none.   All other systems are reviewed and negative.    PHYSICAL EXAM: VS:  BP 144/66 mmHg  Pulse 82  Ht 5\' 8"  (1.727 m)  Wt 183 lb 12.8 oz (83.371 kg)  BMI 27.95 kg/m2 , BMI Body mass index is 27.95 kg/(m^2). GEN: Well nourished, well developed, in no acute distress HEENT: normal Neck: no JVD, carotid bruits, or masses Cardiac: RRR; no murmurs, rubs, or gallops,no edema  Respiratory:  Scattered rhonchi which clear with coughing. GI: soft, nontender, nondistended, + BS MS: no deformity or atrophy Skin: warm and dry, no rash Neuro:  Strength and sensation are intact Psych: euthymic mood, full affect   EKG:   EKG is not ordered today.    Recent Labs: 12/30/2014: ALT 19; BUN 19; Creatinine, Ser 0.95; Potassium 4.0; Sodium 137    Lipid Panel    Component Value Date/Time   CHOL 135 12/30/2014 0741   TRIG 103.0 12/30/2014 0741   HDL 35.30* 12/30/2014 0741   CHOLHDL 4 12/30/2014 0741   VLDL 20.6 12/30/2014 0741   LDLCALC 79 12/30/2014 0741      Wt Readings from Last 3 Encounters:  10/20/15 183 lb 12.8 oz (83.371 kg)  12/30/14 182 lb 12.8 oz (82.918 kg)  06/13/14 187 lb (84.823 kg)         ASSESSMENT AND PLAN:  1. ischemic heart disease status post cardiac arrest and resuscitation in 2006 followed by angioplasty and stenting of his LAD and right coronary artery. Subsequent cardiac catheterizations in 2011 and in March 2014 showed that the stents are widely patent with no new lesions seen. 2. Hypercholesterolemia 3. essential hypertension 4.  Viral upper respiratory infection with cough and sputum production, gradually improving.  Plan: Continue same medication. Blood work today is pending. Recheck in 6 months for office visit, EKG, lipid panel hepatic function panel and basal metabolic panel.  Following my retirement he will see Dr. Jenkins Rouge   Current medicines are reviewed at length with the patient today.  The patient does not have concerns regarding medicines.  The following changes have been made:  no change  Labs/ tests ordered today include:  No orders of the defined types were placed in this encounter.     Berna Spare MD 10/20/2015 11:52 AM    [facility] [place], [place], [place]  [address] Phone: [phone]; Fax: [phone]

## 2015-10-20 NOTE — Patient Instructions (Signed)
Medication Instructions:  Your physician recommends that you continue on your current medications as directed. Please refer to the Current Medication list given to you today.   Labwork: Your physician recommends that you return for lab work in: 6 months - Fasting Lipid/liver/bmet   Testing/Procedures: none  Follow-Up: Your physician wants you to follow-up in: 6 months with Dr. [name]. You will receive a reminder letter in the mail two months in advance. If you don't receive a letter, please call our office to schedule the follow-up appointment.   Any Other Special Instructions Will Be Listed Below (If Applicable).     If you need a refill on your cardiac medications before your next appointment, please call your pharmacy.

## 2015-12-03 ENCOUNTER — Other Ambulatory Visit: Payer: Self-pay | Admitting: Cardiology

## 2015-12-04 ENCOUNTER — Other Ambulatory Visit: Payer: Self-pay | Admitting: *Deleted

## 2016-05-10 DIAGNOSIS — C61 Malignant neoplasm of prostate: Secondary | ICD-10-CM | POA: Diagnosis not present

## 2016-05-20 DIAGNOSIS — Z8546 Personal history of malignant neoplasm of prostate: Secondary | ICD-10-CM | POA: Diagnosis not present

## 2016-06-03 ENCOUNTER — Ambulatory Visit: Payer: BLUE CROSS/BLUE SHIELD | Admitting: Cardiovascular Disease

## 2016-06-19 ENCOUNTER — Other Ambulatory Visit: Payer: Self-pay | Admitting: Cardiovascular Disease

## 2016-06-27 NOTE — Progress Notes (Signed)
Cardiology Office Note   Date:  06/28/2016   ID:  Kurt Diaz, DOB April 02, 1941, MRN [id number]  PCP:  Kurt Kroner, MD  Cardiologist:   Kurt Rouge, MD   Chief Complaint  Patient presents with  . Ischemic Heart Disease      History of Present Illness: Kurt Diaz is a 75 y.o. male previously followed by Kurt Diaz  And new to me Known CAD.     He had acute coronary syndrome in 2006 when [place] and was found to be pulseless and underwent CPR and then underwent emergency cardiac catheterization and had intervention with 2 vessel disease being found. He had angioplasty and stenting to his LAD and angioplasty and stenting of his right coronary artery. His last nuclear stress test January 2011 showed a small area of reversible ischemia and as a result he underwent cardiac catheterization by Kurt. Acie Fredrickson on 10/13/2009 which showed that the stents were widely patent. There was a proximal 70% stenosis in the diagonal which was too small for PCI.  The patient was seen in the office on 11/25/12 as a work in complaining of exertional dyspnea and some intermittent chest discomfort and left jaw discomfort. For this reason we arranged for repeat cardiac catheterization which was done by me on 11/30/12 and it showed that his previous stents were widely patent and no new lesions were seen. Continued medical management was advised.   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 Chest pain   . Coronary artery disease   . Coronary syndrome, acute ([place]) 2006  . Hyperlipidemia   . Syncope and collapse     Past Surgical History:  Procedure Laterality Date  . CARDIAC CATHETERIZATION  10/13/2009   EF 65%  . CARDIOVASCULAR STRESS TEST  09/29/2009   EF 70%  . CORONARY  ANGIOPLASTY     STENTING OF HIS LAD, STENTING OF HIS RIGHT CORONARY.  Marland Kitchen US ECHOCARDIOGRAPHY  12/15/2009   EF 55-60%     Current Outpatient Prescriptions  Medication Sig Dispense Refill  . aspirin 81 MG tablet Take 81 mg by mouth daily.    Marland Kitchen lisinopril (PRINIVIL,ZESTRIL) 10 MG tablet Take 10 mg by mouth daily.    . nitroGLYCERIN (NITROSTAT) 0.4 MG SL tablet Place 0.4 mg under the tongue every 5 (five) minutes as needed for chest pain.     . simvastatin (ZOCOR) 20 MG tablet Take 20 mg by mouth daily.     No current facility-administered medications for this visit.     Allergies:   Review of patient's allergies indicates no known allergies.    Social History:  The patient  reports that he has never smoked. He has never used smokeless tobacco. He reports that he does not drink alcohol or use drugs.   Family History:  The patient's family history includes Heart attack in his mother; Lung cancer in his father; Stroke in his mother.    ROS:  Please see the history of present illness.   Otherwise, review of systems are positive for none.   All other systems are reviewed and negative.    PHYSICAL EXAM: VS:  BP 100/70   Pulse 64   Ht 5\' 8"  (1.727 m)   Wt 86.5 kg (190 lb 12.8  oz)   SpO2 97%   BMI 29.01 kg/m  , BMI Body mass index is 29.01 kg/m. Affect appropriate Healthy:  appears stated age 65: normal Neck supple with no adenopathy JVP normal no bruits no thyromegaly Lungs clear with no wheezing and good diaphragmatic motion Heart:  S1/S2 no murmur, no rub, gallop or click PMI normal Abdomen: benighn, BS positve, no tenderness, no AAA no bruit.  No HSM or HJR Distal pulses intact with no bruits No edema Neuro non-focal Skin warm and dry No muscular weakness    EKG:  SR rate 61 normal 12/30/14  06/28/16  SR rate    Recent Labs: No results found for requested labs within last 8760 hours.    Lipid Panel    Component Value Date/Time   CHOL 135 12/30/2014 0741   TRIG  103.0 12/30/2014 0741   HDL 35.30 (L) 12/30/2014 0741   CHOLHDL 4 12/30/2014 0741   VLDL 20.6 12/30/2014 0741   LDLCALC 79 12/30/2014 0741      Wt Readings from Last 3 Encounters:  06/28/16 86.5 kg (190 lb 12.8 oz)  10/20/15 83.4 kg (183 lb 12.8 oz)  12/30/14 82.9 kg (182 lb 12.8 oz)      Other studies Reviewed: Additional studies/ records that were reviewed today include: Notes from Kurt Diaz Previous caths and nuclear studies .    ASSESSMENT AND PLAN:  1.  CAD:  Post stenting of RCA and LAD 2006 multiple caths since with patency and 70% ostial D1 disease too small to stent Last cath 2014  Continue medical rX 2. Chol;   Cholesterol is at goal.  Continue current dose of statin and diet Rx.  No myalgias or side effects.  F/U  LFT's in 6 months. Lab Results  Component Value Date   LDLCALC 79 12/30/2014            3. HTN:  Well controlled.  Continue current medications and low sodium Dash type diet.      Current medicines are reviewed at length with the patient today.  The patient does not have concerns regarding medicines.  The following changes have been made:  no change  Labs/ tests ordered today include: None  Orders Placed This Encounter  Procedures  . EKG 12-Lead     Disposition:   FU with me in a year      Signed, Kurt Rouge, MD  06/28/2016 8:10 AM    [address] [place], [place], [place]  [address] Phone: [phone]; Fax: [phone]

## 2016-06-28 ENCOUNTER — Encounter: Payer: Self-pay | Admitting: Cardiovascular Disease

## 2016-06-28 ENCOUNTER — Ambulatory Visit (INDEPENDENT_AMBULATORY_CARE_PROVIDER_SITE_OTHER): Payer: BLUE CROSS/BLUE SHIELD | Admitting: Cardiovascular Disease

## 2016-06-28 VITALS — BP 100/70 | HR 64 | Ht 68.0 in | Wt 190.8 lb

## 2016-06-28 DIAGNOSIS — I259 Chronic ischemic heart disease, unspecified: Secondary | ICD-10-CM

## 2016-06-28 NOTE — Patient Instructions (Signed)
**Note De-identified Kurt Diaz Obfuscation** Medication Instructions:  Same-no changes  Labwork: None  Testing/Procedures: None  Follow-Up: Your physician wants you to follow-up in: 1 year.You will receive a reminder letter in the mail two months in advance. If you don't receive a letter, please call our office to schedule the follow-up appointment.   Any Other Special Instructions Will Be Listed Below (If Applicable).     If you need a refill on your cardiac medications before your next appointment, please call your pharmacy.   

## 2016-09-26 ENCOUNTER — Other Ambulatory Visit: Payer: Self-pay | Admitting: Cardiovascular Disease

## 2016-10-21 ENCOUNTER — Telehealth: Payer: Self-pay | Admitting: Cardiovascular Disease

## 2016-10-21 NOTE — Telephone Encounter (Signed)
Left message for patient to call back  

## 2016-10-21 NOTE — Telephone Encounter (Signed)
Pt c/o of Chest Pain: STAT if CP now or developed within 24 hours  1. Are you having CP right now? no  2. Are you experiencing any other symptoms (ex. SOB, nausea, vomiting, sweating)? no  3. How long have you been experiencing CP? About 4 weeks  4. Is your CP continuous or coming and going? n/a  5. Have you taken Nitroglycerin? n/a ? Patient is requesting a call back about the next steps he needs to take, thanks.

## 2016-10-22 NOTE — Telephone Encounter (Signed)
Left second message for patient to call back.

## 2016-10-23 NOTE — Telephone Encounter (Signed)
Follow Up ° °Pt returning call.  °

## 2016-10-23 NOTE — Telephone Encounter (Signed)
Returned call to patient.He stated he has had 2 episodes of chest pain this month.No chest pain at present.Stated he would like to be seen.Appointment scheduled with [name] NP 10/24/16 at 2:30 pm.

## 2016-10-24 ENCOUNTER — Encounter (INDEPENDENT_AMBULATORY_CARE_PROVIDER_SITE_OTHER): Payer: Self-pay

## 2016-10-24 ENCOUNTER — Encounter: Payer: Self-pay | Admitting: Cardiology

## 2016-10-24 ENCOUNTER — Telehealth: Payer: Self-pay | Admitting: Cardiology

## 2016-10-24 ENCOUNTER — Ambulatory Visit (INDEPENDENT_AMBULATORY_CARE_PROVIDER_SITE_OTHER): Payer: BLUE CROSS/BLUE SHIELD | Admitting: Cardiology

## 2016-10-24 VITALS — BP 140/70 | HR 60 | Ht 68.0 in | Wt 194.8 lb

## 2016-10-24 DIAGNOSIS — I259 Chronic ischemic heart disease, unspecified: Secondary | ICD-10-CM | POA: Diagnosis not present

## 2016-10-24 DIAGNOSIS — E7800 Pure hypercholesterolemia, unspecified: Secondary | ICD-10-CM | POA: Diagnosis not present

## 2016-10-24 DIAGNOSIS — I200 Unstable angina: Secondary | ICD-10-CM | POA: Diagnosis not present

## 2016-10-24 DIAGNOSIS — I119 Hypertensive heart disease without heart failure: Secondary | ICD-10-CM | POA: Diagnosis not present

## 2016-10-24 LAB — CBC WITH DIFFERENTIAL/PLATELET
Basophils Absolute: 0 10*3/uL (ref 0.0–0.2)
Basos: 1 %
EOS (ABSOLUTE): 0.1 10*3/uL (ref 0.0–0.4)
Eos: 2 %
Hematocrit: 42.8 % (ref 37.5–51.0)
Hemoglobin: 14.8 g/dL (ref 13.0–17.7)
Lymphocytes Absolute: 2.5 10*3/uL (ref 0.7–3.1)
Lymphs: 31 %
MCH: 30.4 pg (ref 26.6–33.0)
MCHC: 34.6 g/dL (ref 31.5–35.7)
MCV: 88 fL (ref 79–97)
Monocytes Absolute: 0.7 10*3/uL (ref 0.1–0.9)
Monocytes: 9 %
Neutrophils Absolute: 4.6 10*3/uL (ref 1.4–7.0)
Neutrophils: 57 %
Platelets: 218 10*3/uL (ref 150–379)
RBC: 4.87 x10E6/uL (ref 4.14–5.80)
RDW: 14.6 % (ref 12.3–15.4)
WBC: 7.9 10*3/uL (ref 3.4–10.8)

## 2016-10-24 LAB — BASIC METABOLIC PANEL
BUN/Creatinine Ratio: 21 (ref 10–24)
BUN: 21 mg/dL (ref 8–27)
CO2: 28 mmol/L (ref 18–29)
Calcium: 9.1 mg/dL (ref 8.6–10.2)
Chloride: 102 mmol/L (ref 96–106)
Creatinine, Ser: 0.99 mg/dL (ref 0.76–1.27)
GFR calc Af Amer: 86 mL/min/{1.73_m2} (ref >59)
GFR calc non Af Amer: 74 mL/min/{1.73_m2} (ref >59)
Glucose: 100 mg/dL — ABNORMAL HIGH (ref 65–99)
Potassium: 4.2 mmol/L (ref 3.5–5.2)
Sodium: 138 mmol/L (ref 134–144)

## 2016-10-24 LAB — APTT: aPTT: 25 s (ref 24–33)

## 2016-10-24 LAB — PROTIME-INR
INR: 0.9 (ref 0.8–1.2)
Prothrombin Time: 10 s (ref 9.1–12.0)

## 2016-10-24 NOTE — Patient Instructions (Signed)
Medication Instructions:  Your physician recommends that you continue on your current medications as directed. Please refer to the Current Medication list given to you today.   Labwork: Your physician recommends that you have lab work today: STAT BMET/CBC/PT/INR/PTT    Testing/Procedures: -None  Follow-Up: Your physician recommends that you keep your scheduled  follow-up appointment with [name], NP.   Any Other Special Instructions Will Be Listed Below (If Applicable).   Your provider has recommended a cardiac catherization  You are scheduled for a cardiac catheterization on Friday, February 2 with Dr. Tamala Julian or associate.  Please arrive at the [facility] (Main Entrance) at [facility] at [facility] [place] Stay on Friday, February 2 at 10:00 am .    Special note: Every effort is made to have your procedure done on time.   Please understand that emergencies sometimes delay a scheduled   procedure.  No food or drink after midnight on TONIGHT. You may take your morning medications with a sip of water on the day of your procedure.  Medications to [place] for a one night stay -- bring personal belongings.  Bring a current list of your medications and current insurance cards.  You MUST have a responsible person to drive you home. Someone MUST be with you the first 24 hours after you arrive home or your discharge will be delayed. Wear clothes that are easy to get on and off and wear slip on shoes.    Coronary Angiogram A coronary angiogram, also called coronary angiography, is an X-ray procedure used to look at the arteries in the heart. In this procedure, a dye (contrast dye) is injected through a long, hollow tube (catheter). The catheter is about the size of a piece of cooked spaghetti and is inserted through your groin, wrist, or arm. The dye is injected into each artery, and X-rays are then taken to show if there is a blockage  in the arteries of your heart.  LET [facility] CARE PROVIDER KNOW ABOUT:  Any allergies you have, including allergies to shellfish or contrast dye.   All medicines you are taking, including vitamins, herbs, eye drops, creams, and over-the-counter medicines.   Previous problems you or members of your family have had with the use of anesthetics.   Any blood disorders you have.   Previous surgeries you have had.  History of kidney problems or failure.   Other medical conditions you have.  RISKS AND COMPLICATIONS  Generally, a coronary angiogram is a safe procedure. However, about 1 person out of 1000 can have problems that may include:  Allergic reaction to the dye.  Bleeding/bruising from the access site or other locations.  Kidney injury, especially in people with impaired kidney function.  Stroke (rare).  Heart attack (rare).  Irregular rhythms (rare)  Death (rare)  BEFORE THE PROCEDURE   Do not eat or drink anything after midnight the night before the procedure or as directed by your health care provider.   Ask your health care provider about changing or stopping your regular medicines. This is especially important if you are taking diabetes medicines or blood thinners.  PROCEDURE  You may be given a medicine to help you relax (sedative) before the procedure. This medicine is given through an intravenous (IV) access tube that is inserted into one of your veins.   The area where the catheter will be inserted will be washed and shaved. This is  usually done in the groin but may be done in the fold of your arm (near your elbow) or in the wrist.   A medicine will be given to numb the area where the catheter will be inserted (local anesthetic).   The health care provider will insert the catheter into an artery. The catheter will be guided by using a special type of X-ray (fluoroscopy) of the blood vessel being examined.   A special dye will then be injected  into the catheter, and X-rays will be taken. The dye will help to show where any narrowing or blockages are located in the heart arteries.    AFTER THE PROCEDURE   If the procedure is done through the leg, you will be kept in bed lying flat for several hours. You will be instructed to not bend or cross your legs.  The insertion site will be checked frequently.   The pulse in your feet or wrist will be checked frequently.   Additional blood tests, X-rays, and an electrocardiogram may be done.       If you need a refill on your cardiac medications before your next appointment, please call your pharmacy.

## 2016-10-24 NOTE — Telephone Encounter (Signed)
New Message  Pt call requesting to speak to RN about reschedule Cath procedure. Please call back to discuss

## 2016-10-24 NOTE — Progress Notes (Signed)
Cardiology Office Note   Date:  10/24/2016   ID:  Kurt Diaz, DOB [birth year], MRN [id number]  PCP:  [name], MD  Cardiologist:  Dr. [name]     Chief Complaint  Patient presents with  . Chest Pain      History of Present Illness: Kurt Diaz is a 76 y.o. male who presents for chest pain.  He called to make appt.   He had acute coronary syndrome in 2006 when [place] and was found to be pulseless and underwent CPR and then underwent emergency cardiac catheterization and had intervention with 2 vessel disease being found. He had angioplasty and stenting to his LAD and angioplasty and stenting of his right coronary artery. His last nuclear stress test January 2011 showed a small area of reversible ischemia and as a result he underwent cardiac catheterization by Dr. Acie Fredrickson on 10/13/2009 which showed that the stents were widely patent. There was a proximal 70% stenosis in the diagonal which was too small for PCI. The patient was seen in the office on 11/25/12 as a work in complaining of exertional dyspnea and some intermittent chest discomfort and left jaw discomfort. For this reason we arranged for repeat cardiac catheterization which was done by me on 11/30/12 and it showed that his previous stents were widely patent and no new lesions were seen. Continued medical management was advised.   Today he has had 2 episodes of chest pain, not his usual jaw numbness.  First episode woke him from sleep and he had mild SOB associated and lasted 4-5 min.  The second episode was about 2 weeks later at work more intense, chest pain localized Just to rt of sternum but did radiate across chest.  This episode lasted 10 min.  And he took 1 NTG without relief, but pain resolved after 10 min.  He has also noted more SOB climbing stairs.  This was one symptom he had prior to the MI in 2011.  He also mentions his BP is elevated at times.     Past Medical History:    Diagnosis Date  . Chest pain   . Coronary artery disease   . Coronary syndrome, acute ([place]) 2006  . Hyperlipidemia   . Syncope and collapse     Past Surgical History:  Procedure Laterality Date  . CARDIAC CATHETERIZATION  10/13/2009   EF 65%  . CARDIOVASCULAR STRESS TEST  09/29/2009   EF 70%  . CORONARY ANGIOPLASTY     STENTING OF HIS LAD, STENTING OF HIS RIGHT CORONARY.  Marland Kitchen US ECHOCARDIOGRAPHY  12/15/2009   EF 55-60%     Current Outpatient Prescriptions  Medication Sig Dispense Refill  . aspirin 81 MG tablet Take 81 mg by mouth daily.    Marland Kitchen lisinopril (PRINIVIL,ZESTRIL) 10 MG tablet Take 10 mg by mouth daily.    . nitroGLYCERIN (NITROSTAT) 0.4 MG SL tablet Place 0.4 mg under the tongue every 5 (five) minutes as needed for chest pain.     . simvastatin (ZOCOR) 20 MG tablet Take 20 mg by mouth daily.     No current facility-administered medications for this visit.     Allergies:   Patient has no known allergies.    Social History:  The patient  reports that he has never smoked. He has never used smokeless tobacco. He reports that he does not drink alcohol or use drugs.   Family History:  The patient's family history includes Heart attack  in his mother; Lung cancer in his father; Stroke in his mother.    ROS:  General:no colds or fevers, no weight changes Skin:no rashes or ulcers HEENT:no blurred vision, no congestion CV:see HPI PUL:see HPI GI:no diarrhea constipation or melena, no indigestion GU:no hematuria, no dysuria MS:no joint pain, no claudication Neuro:no syncope, no lightheadedness Endo:no diabetes, no thyroid disease  Wt Readings from Last 3 Encounters:  10/24/16 194 lb 12.8 oz (88.4 kg)  06/28/16 190 lb 12.8 oz (86.5 kg)  10/20/15 183 lb 12.8 oz (83.4 kg)     PHYSICAL EXAM: VS:  BP 140/70   Pulse 60   Ht 5\' 8"  (1.727 m)   Wt 194 lb 12.8 oz (88.4 kg)   BMI 29.62 kg/m  , BMI Body mass index is 29.62 kg/m. General:Pleasant affect, NAD Skin:Warm and  dry, brisk capillary refill HEENT:normocephalic, sclera clear, mucus membranes moist Neck:supple, no JVD, no bruits  Heart:S1S2 RRR without murmur, gallup, rub or click Lungs:clear without rales, rhonchi, or wheezes [id number], non tender, + BS, do not palpate liver spleen or masses Ext:no lower ext edema, 2+ pedal pulses, 2+ radial pulses Neuro:alert and oriented, MAE, follows commands, + facial symmetry    EKG:  EKG is ordered today. The ekg ordered today demonstrates SR rate of 60 no acute changes.   Recent Labs: No results found for requested labs within last 8760 hours.    Lipid Panel    Component Value Date/Time   CHOL 135 12/30/2014 0741   TRIG 103.0 12/30/2014 0741   HDL 35.30 (L) 12/30/2014 0741   CHOLHDL 4 12/30/2014 0741   VLDL 20.6 12/30/2014 0741   LDLCALC 79 12/30/2014 0741       Other studies Reviewed: Additional studies/ records that were reviewed today include: last cath in 2014. .   ASSESSMENT AND PLAN:  1.  Angina  first in several years.  Discussed with Dr. [name] will plan for cardiac cath, pt can do tomorrow, will do stat labs today   The patient understands that risks included but are not limited to stroke (1 in 1000), death (1 in 1000), kidney failure [usually temporary] (1 in 500), bleeding (1 in 200), allergic reaction [possibly serious] (1 in 200).   Pt agreeable.    2. HLD on statin  3. HTN post cath may need to increase Ace - HR is 60 so I did not add BB.       Current medicines are reviewed with the patient today.  The patient Has no concerns regarding medicines.  The following changes have been made:  See above Labs/ tests ordered today include:see above  Disposition:   FU:  see above  Signed, [name], NP  10/24/2016 3:21 PM    [address], [place], [place], [place] Phone: [phone]; Fax: [phone]

## 2016-10-25 ENCOUNTER — Encounter (HOSPITAL_COMMUNITY)
Admission: RE | Disposition: A | Discharge status: Home / Self Care | Payer: Self-pay | Source: Ambulatory Visit | Attending: Interventional Cardiology

## 2016-10-25 ENCOUNTER — Ambulatory Visit (HOSPITAL_COMMUNITY)
Admission: RE | Admit: 2016-10-25 | Discharge: 2016-10-26 | Disposition: A | Discharge status: Home / Self Care | Payer: BLUE CROSS/BLUE SHIELD | Source: Ambulatory Visit | Attending: Interventional Cardiology | Admitting: Interventional Cardiology

## 2016-10-25 DIAGNOSIS — I119 Hypertensive heart disease without heart failure: Secondary | ICD-10-CM | POA: Insufficient documentation

## 2016-10-25 DIAGNOSIS — Z8249 Family history of ischemic heart disease and other diseases of the circulatory system: Secondary | ICD-10-CM | POA: Insufficient documentation

## 2016-10-25 DIAGNOSIS — I25119 Atherosclerotic heart disease of native coronary artery with unspecified angina pectoris: Secondary | ICD-10-CM | POA: Diagnosis not present

## 2016-10-25 DIAGNOSIS — Z823 Family history of stroke: Secondary | ICD-10-CM | POA: Diagnosis not present

## 2016-10-25 DIAGNOSIS — Z955 Presence of coronary angioplasty implant and graft: Secondary | ICD-10-CM | POA: Diagnosis not present

## 2016-10-25 DIAGNOSIS — I25110 Atherosclerotic heart disease of native coronary artery with unstable angina pectoris: Secondary | ICD-10-CM | POA: Diagnosis not present

## 2016-10-25 DIAGNOSIS — Z801 Family history of malignant neoplasm of trachea, bronchus and lung: Secondary | ICD-10-CM | POA: Insufficient documentation

## 2016-10-25 DIAGNOSIS — E7800 Pure hypercholesterolemia, unspecified: Secondary | ICD-10-CM | POA: Insufficient documentation

## 2016-10-25 DIAGNOSIS — E785 Hyperlipidemia, unspecified: Secondary | ICD-10-CM | POA: Insufficient documentation

## 2016-10-25 DIAGNOSIS — I252 Old myocardial infarction: Secondary | ICD-10-CM | POA: Insufficient documentation

## 2016-10-25 DIAGNOSIS — I2510 Atherosclerotic heart disease of native coronary artery without angina pectoris: Secondary | ICD-10-CM | POA: Diagnosis present

## 2016-10-25 DIAGNOSIS — I259 Chronic ischemic heart disease, unspecified: Secondary | ICD-10-CM | POA: Diagnosis present

## 2016-10-25 DIAGNOSIS — Z7982 Long term (current) use of aspirin: Secondary | ICD-10-CM | POA: Insufficient documentation

## 2016-10-25 HISTORY — PX: CARDIAC CATHETERIZATION: SHX172

## 2016-10-25 LAB — POCT ACTIVATED CLOTTING TIME
Activated Clotting Time: 257 seconds
Activated Clotting Time: 257 seconds
Activated Clotting Time: 444 seconds

## 2016-10-25 SURGERY — LEFT HEART CATH AND CORONARY ANGIOGRAPHY
Anesthesia: LOCAL

## 2016-10-25 MED ORDER — SODIUM CHLORIDE 0.9 % IV SOLN: 250.0000 mL | INTRAVENOUS | Status: DC | PRN

## 2016-10-25 MED ORDER — ATORVASTATIN CALCIUM 80 MG PO TABS
80.0000 mg | ORAL_TABLET | Freq: Every day | ORAL | Status: DC
  Administered 2016-10-25: 19:00:00 80 mg via ORAL
  Filled 2016-10-25: qty 1

## 2016-10-25 MED ORDER — ADENOSINE (DIAGNOSTIC) 140MCG/KG/MIN
INTRAVENOUS | Status: DC | PRN
  Administered 2016-10-25: 140 ug/kg/min via INTRAVENOUS

## 2016-10-25 MED ORDER — ATORVASTATIN CALCIUM 80 MG PO TABS: 80.0000 mg | ORAL_TABLET | Freq: Every day | ORAL | Status: DC

## 2016-10-25 MED ORDER — SODIUM CHLORIDE 0.9 % IV SOLN
INTRAVENOUS | Status: DC | PRN
  Administered 2016-10-25: 500 mL via INTRAVENOUS

## 2016-10-25 MED ORDER — SODIUM CHLORIDE 0.9 % WEIGHT BASED INFUSION
3.0000 mL/kg/h | INTRAVENOUS | Status: DC
  Administered 2016-10-25: 3 mL/kg/h via INTRAVENOUS

## 2016-10-25 MED ORDER — ANGIOPLASTY BOOK
Freq: Once | Status: DC
  Filled 2016-10-25: qty 1

## 2016-10-25 MED ORDER — ASPIRIN 81 MG PO CHEW: 81.0000 mg | CHEWABLE_TABLET | Freq: Every day | ORAL | Status: DC

## 2016-10-25 MED ORDER — TICAGRELOR 90 MG PO TABS
ORAL_TABLET | ORAL | Status: DC | PRN
  Administered 2016-10-25: 180 mg via ORAL

## 2016-10-25 MED ORDER — SODIUM CHLORIDE 0.9% FLUSH
3.0000 mL | INTRAVENOUS | Status: DC | PRN
Start: 2016-10-25 — End: 2016-10-25

## 2016-10-25 MED ORDER — HEPARIN SODIUM (PORCINE) 1000 UNIT/ML IJ SOLN
INTRAMUSCULAR | Status: DC | PRN
Start: 2016-10-25 — End: 2016-10-25
  Administered 2016-10-25: 2500 [IU] via INTRAVENOUS
  Administered 2016-10-25: 5000 [IU] via INTRAVENOUS
  Administered 2016-10-25: 2500 [IU] via INTRAVENOUS
  Administered 2016-10-25: 4500 [IU] via INTRAVENOUS
  Administered 2016-10-25: 2000 [IU] via INTRAVENOUS

## 2016-10-25 MED ORDER — ASPIRIN 81 MG PO CHEW
CHEWABLE_TABLET | ORAL | Status: AC
  Administered 2016-10-25: 81 mg
  Filled 2016-10-25: qty 1

## 2016-10-25 MED ORDER — MIDAZOLAM HCL 2 MG/2ML IJ SOLN
INTRAMUSCULAR | Status: AC
  Filled 2016-10-25: qty 2

## 2016-10-25 MED ORDER — SODIUM CHLORIDE 0.9 % IV SOLN: INTRAVENOUS | Status: AC

## 2016-10-25 MED ORDER — ASPIRIN 81 MG PO CHEW: 81.0000 mg | CHEWABLE_TABLET | ORAL | Status: DC

## 2016-10-25 MED ORDER — SODIUM CHLORIDE 0.9% FLUSH: 3.0000 mL | INTRAVENOUS | Status: DC | PRN

## 2016-10-25 MED ORDER — VERAPAMIL HCL 2.5 MG/ML IV SOLN
INTRAVENOUS | Status: AC
  Filled 2016-10-25: qty 2

## 2016-10-25 MED ORDER — IOPAMIDOL (ISOVUE-370) INJECTION 76%
INTRAVENOUS | Status: DC | PRN
  Administered 2016-10-25: 215 mL via INTRA_ARTERIAL

## 2016-10-25 MED ORDER — SODIUM CHLORIDE 0.9 % IV SOLN: INTRAVENOUS | Status: DC

## 2016-10-25 MED ORDER — HYDRALAZINE HCL 20 MG/ML IJ SOLN: 5.0000 mg | INTRAMUSCULAR | Status: AC | PRN

## 2016-10-25 MED ORDER — SODIUM CHLORIDE 0.9% FLUSH: 3.0000 mL | Freq: Two times a day (BID) | INTRAVENOUS | Status: DC

## 2016-10-25 MED ORDER — NITROGLYCERIN 1 MG/10 ML FOR IR/CATH LAB
INTRA_ARTERIAL | Status: DC | PRN
  Administered 2016-10-25 (×3): 200 ug via INTRACORONARY

## 2016-10-25 MED ORDER — IOPAMIDOL (ISOVUE-370) INJECTION 76%
INTRAVENOUS | Status: AC
  Filled 2016-10-25: qty 50

## 2016-10-25 MED ORDER — SODIUM CHLORIDE 0.9% FLUSH
3.0000 mL | Freq: Two times a day (BID) | INTRAVENOUS | Status: DC
  Administered 2016-10-25: 21:00:00 3 mL via INTRAVENOUS

## 2016-10-25 MED ORDER — ONDANSETRON HCL 4 MG/2ML IJ SOLN: 4.0000 mg | Freq: Four times a day (QID) | INTRAMUSCULAR | Status: DC | PRN

## 2016-10-25 MED ORDER — ADENOSINE 12 MG/4ML IV SOLN
INTRAVENOUS | Status: AC
  Filled 2016-10-25: qty 16

## 2016-10-25 MED ORDER — HYDRALAZINE HCL 20 MG/ML IJ SOLN: 5.0000 mg | INTRAMUSCULAR | Status: DC | PRN

## 2016-10-25 MED ORDER — OXYCODONE-ACETAMINOPHEN 5-325 MG PO TABS: 1.0000 | ORAL_TABLET | ORAL | Status: DC | PRN

## 2016-10-25 MED ORDER — HEPARIN SODIUM (PORCINE) 1000 UNIT/ML IJ SOLN
INTRAMUSCULAR | Status: AC
  Filled 2016-10-25: qty 1

## 2016-10-25 MED ORDER — FENTANYL CITRATE (PF) 100 MCG/2ML IJ SOLN
INTRAMUSCULAR | Status: DC | PRN
  Administered 2016-10-25 (×2): 50 ug via INTRAVENOUS

## 2016-10-25 MED ORDER — LISINOPRIL 10 MG PO TABS
10.0000 mg | ORAL_TABLET | Freq: Every evening | ORAL | Status: DC
  Administered 2016-10-25: 19:00:00 10 mg via ORAL
  Filled 2016-10-25: qty 1

## 2016-10-25 MED ORDER — TICAGRELOR 90 MG PO TABS: 90.0000 mg | ORAL_TABLET | Freq: Two times a day (BID) | ORAL | Status: DC

## 2016-10-25 MED ORDER — LIDOCAINE HCL (PF) 1 % IJ SOLN
INTRAMUSCULAR | Status: AC
  Filled 2016-10-25: qty 30

## 2016-10-25 MED ORDER — HEPARIN (PORCINE) IN NACL 2-0.9 UNIT/ML-% IJ SOLN
INTRAMUSCULAR | Status: AC
  Filled 2016-10-25: qty 1000

## 2016-10-25 MED ORDER — TICAGRELOR 90 MG PO TABS
90.0000 mg | ORAL_TABLET | Freq: Two times a day (BID) | ORAL | Status: DC
  Administered 2016-10-26 (×2): 90 mg via ORAL
  Filled 2016-10-25 (×2): qty 1

## 2016-10-25 MED ORDER — ACETAMINOPHEN 325 MG PO TABS: 650.0000 mg | ORAL_TABLET | ORAL | Status: DC | PRN

## 2016-10-25 MED ORDER — LIDOCAINE HCL (PF) 1 % IJ SOLN
INTRAMUSCULAR | Status: DC | PRN
  Administered 2016-10-25: 2 mL

## 2016-10-25 MED ORDER — TICAGRELOR 90 MG PO TABS
ORAL_TABLET | ORAL | Status: AC
  Filled 2016-10-25: qty 2

## 2016-10-25 MED ORDER — SODIUM CHLORIDE 0.9 % WEIGHT BASED INFUSION: 1.0000 mL/kg/h | INTRAVENOUS | Status: DC

## 2016-10-25 MED ORDER — MORPHINE SULFATE (PF) 4 MG/ML IV SOLN: 2.0000 mg | INTRAVENOUS | Status: DC | PRN

## 2016-10-25 MED ORDER — ACETAMINOPHEN 325 MG PO TABS
650.0000 mg | ORAL_TABLET | ORAL | Status: DC | PRN
  Filled 2016-10-25: qty 2

## 2016-10-25 MED ORDER — HEPARIN (PORCINE) IN NACL 2-0.9 UNIT/ML-% IJ SOLN
INTRAMUSCULAR | Status: DC | PRN
  Administered 2016-10-25: 1000 mL

## 2016-10-25 MED ORDER — NITROGLYCERIN 1 MG/10 ML FOR IR/CATH LAB
INTRA_ARTERIAL | Status: AC
  Filled 2016-10-25: qty 10

## 2016-10-25 MED ORDER — VERAPAMIL HCL 2.5 MG/ML IV SOLN
INTRAVENOUS | Status: DC | PRN
  Administered 2016-10-25: 10 mL via INTRA_ARTERIAL

## 2016-10-25 MED ORDER — MIDAZOLAM HCL 2 MG/2ML IJ SOLN
INTRAMUSCULAR | Status: DC | PRN
Start: 2016-10-25 — End: 2016-10-25
  Administered 2016-10-25 (×4): 1 mg via INTRAVENOUS

## 2016-10-25 MED ORDER — FENTANYL CITRATE (PF) 100 MCG/2ML IJ SOLN
INTRAMUSCULAR | Status: AC
  Filled 2016-10-25: qty 2

## 2016-10-25 MED ORDER — LABETALOL HCL 5 MG/ML IV SOLN: 10.0000 mg | INTRAVENOUS | Status: DC | PRN

## 2016-10-25 MED ORDER — IOPAMIDOL (ISOVUE-370) INJECTION 76%
INTRAVENOUS | Status: AC
  Filled 2016-10-25: qty 100

## 2016-10-25 MED ORDER — LABETALOL HCL 5 MG/ML IV SOLN: 10.0000 mg | INTRAVENOUS | Status: AC | PRN

## 2016-10-25 SURGICAL SUPPLY — 25 items
BALLN MOZEC 2.25X14 (BALLOONS) ×2
BALLOON MOZEC 2.25X14 (BALLOONS) ×1 IMPLANT
CATH INFINITI 5 FR JL3.5 (CATHETERS) ×2 IMPLANT
CATH INFINITI JR4 5F (CATHETERS) ×2 IMPLANT
CATH LAUNCHER 5F EBU3.0 (CATHETERS) ×1 IMPLANT
CATH LAUNCHER 5F EBU3.5 (CATHETERS) ×2 IMPLANT
CATH MICROCATH NAVVUS (MICROCATHETER) ×1 IMPLANT
CATHETER LAUNCHER 5F EBU3.0 (CATHETERS) ×2
DEVICE RAD COMP TR BAND LRG (VASCULAR PRODUCTS) ×2 IMPLANT
GLIDESHEATH SLEND A-KIT 6F 22G (SHEATH) ×2 IMPLANT
GUIDE CATH RUNWAY 6FR FR4 SH (CATHETERS) ×2 IMPLANT
GUIDE CATH RUNWAY 6FR IM SH (CATHETERS) ×2 IMPLANT
GUIDEWIRE INQWIRE 1.5J.035X260 (WIRE) ×1 IMPLANT
GUIDEWIRE PRESSURE COMET II (WIRE) ×2 IMPLANT
INQWIRE 1.5J .035X260CM (WIRE) ×2
KIT ENCORE 26 ADVANTAGE (KITS) ×2 IMPLANT
KIT ESSENTIALS PG (KITS) ×2 IMPLANT
KIT HEART LEFT (KITS) ×2 IMPLANT
MICROCATHETER NAVVUS (MICROCATHETER) ×2
PACK CARDIAC CATHETERIZATION (CUSTOM PROCEDURE TRAY) ×2 IMPLANT
STENT RESOLUTE ONYX 2.25X18 (Permanent Stent) ×2 IMPLANT
STENT RESOLUTE ONYX 2.75X15 (Permanent Stent) ×2 IMPLANT
TRANSDUCER W/STOPCOCK (MISCELLANEOUS) ×2 IMPLANT
TUBING CIL FLEX 10 FLL-RA (TUBING) ×2 IMPLANT
WIRE ASAHI PROWATER 180CM (WIRE) ×4 IMPLANT

## 2016-10-25 NOTE — Telephone Encounter (Signed)
Follow up     Returning your call about heart cath. Please call

## 2016-10-25 NOTE — H&P (View-Only) (Signed)
Cardiology Office Note   Date:  10/24/2016   ID:  Kurt Diaz, DOB November 01, 1940, MRN [id number]  PCP:  [name], MD  Cardiologist:  Dr. [name]     Chief Complaint  Patient presents with  . Chest Pain      History of Present Illness: Kurt Diaz is a 76 y.o. male who presents for chest pain.  He called to make appt.   He had acute coronary syndrome in 2006 when he [place] and was found to be pulseless and underwent CPR and then underwent emergency cardiac catheterization and had intervention with 2 vessel disease being found. He had angioplasty and stenting to his LAD and angioplasty and stenting of his right coronary artery. His last nuclear stress test January 2011 showed a small area of reversible ischemia and as a result he underwent cardiac catheterization by Dr. Acie Fredrickson on 10/13/2009 which showed that the stents were widely patent. There was a proximal 70% stenosis in the diagonal which was too small for PCI. The patient was seen in the office on 11/25/12 as a work in complaining of exertional dyspnea and some intermittent chest discomfort and left jaw discomfort. For this reason we arranged for repeat cardiac catheterization which was done by me on 11/30/12 and it showed that his previous stents were widely patent and no new lesions were seen. Continued medical management was advised.   Today he has had 2 episodes of chest pain, not his usual jaw numbness.  First episode woke him from sleep and he had mild SOB associated and lasted 4-5 min.  The second episode was about 2 weeks later at work more intense, chest pain localized Just to rt of sternum but did radiate across chest.  This episode lasted 10 min.  And he took 1 NTG without relief, but pain resolved after 10 min.  He has also noted more SOB climbing stairs.  This was one symptom he had prior to the MI in 2011.  He also mentions his BP is elevated at times.     Past Medical History:    Diagnosis Date  . Chest pain   . Coronary artery disease   . Coronary syndrome, acute ([place]) 2006  . Hyperlipidemia   . Syncope and collapse     Past Surgical History:  Procedure Laterality Date  . CARDIAC CATHETERIZATION  10/13/2009   EF 65%  . CARDIOVASCULAR STRESS TEST  09/29/2009   EF 70%  . CORONARY ANGIOPLASTY     STENTING OF HIS LAD, STENTING OF HIS RIGHT CORONARY.  Marland Kitchen US ECHOCARDIOGRAPHY  12/15/2009   EF 55-60%     Current Outpatient Prescriptions  Medication Sig Dispense Refill  . aspirin 81 MG tablet Take 81 mg by mouth daily.    Marland Kitchen lisinopril (PRINIVIL,ZESTRIL) 10 MG tablet Take 10 mg by mouth daily.    . nitroGLYCERIN (NITROSTAT) 0.4 MG SL tablet Place 0.4 mg under the tongue every 5 (five) minutes as needed for chest pain.     . simvastatin (ZOCOR) 20 MG tablet Take 20 mg by mouth daily.     No current facility-administered medications for this visit.     Allergies:   Patient has no known allergies.    Social History:  The patient  reports that he has never smoked. He has never used smokeless tobacco. He reports that he does not drink alcohol or use drugs.   Family History:  The patient's family history includes Heart attack  in his mother; Lung cancer in his father; Stroke in his mother.    ROS:  General:no colds or fevers, no weight changes Skin:no rashes or ulcers HEENT:no blurred vision, no congestion CV:see HPI PUL:see HPI GI:no diarrhea constipation or melena, no indigestion GU:no hematuria, no dysuria MS:no joint pain, no claudication Neuro:no syncope, no lightheadedness Endo:no diabetes, no thyroid disease  Wt Readings from Last 3 Encounters:  10/24/16 194 lb 12.8 oz (88.4 kg)  06/28/16 190 lb 12.8 oz (86.5 kg)  10/20/15 183 lb 12.8 oz (83.4 kg)     PHYSICAL EXAM: VS:  BP 140/70   Pulse 60   Ht 5\' 8"  (1.727 m)   Wt 194 lb 12.8 oz (88.4 kg)   BMI 29.62 kg/m  , BMI Body mass index is 29.62 kg/m. General:Pleasant affect, NAD Skin:Warm and  dry, brisk capillary refill HEENT:normocephalic, sclera clear, mucus membranes moist Neck:supple, no JVD, no bruits  Heart:S1S2 RRR without murmur, gallup, rub or click Lungs:clear without rales, rhonchi, or wheezes [id number], non tender, + BS, do not palpate liver spleen or masses Ext:no lower ext edema, 2+ pedal pulses, 2+ radial pulses Neuro:alert and oriented, MAE, follows commands, + facial symmetry    EKG:  EKG is ordered today. The ekg ordered today demonstrates SR rate of 60 no acute changes.   Recent Labs: No results found for requested labs within last 8760 hours.    Lipid Panel    Component Value Date/Time   CHOL 135 12/30/2014 0741   TRIG 103.0 12/30/2014 0741   HDL 35.30 (L) 12/30/2014 0741   CHOLHDL 4 12/30/2014 0741   VLDL 20.6 12/30/2014 0741   LDLCALC 79 12/30/2014 0741       Other studies Reviewed: Additional studies/ records that were reviewed today include: last cath in 2014. .   ASSESSMENT AND PLAN:  1.  Angina  first in several years.  Discussed with Dr. [name] will plan for cardiac cath, pt can do tomorrow, will do stat labs today   The patient understands that risks included but are not limited to stroke (1 in 1000), death (1 in 1000), kidney failure [usually temporary] (1 in 500), bleeding (1 in 200), allergic reaction [possibly serious] (1 in 200).   Pt agreeable.    2. HLD on statin  3. HTN post cath may need to increase Ace - HR is 60 so I did not add BB.       Current medicines are reviewed with the patient today.  The patient Has no concerns regarding medicines.  The following changes have been made:  See above Labs/ tests ordered today include:see above  Disposition:   FU:  see above  Signed, [name], NP  10/24/2016 3:21 PM    [address], [place], [place], [place] Phone: [phone]; Fax: [phone]

## 2016-10-25 NOTE — Telephone Encounter (Signed)
Returned pts call.  Left a message for pt to call back re: cancelling his heart cath for today.  If pt calls, please page me.

## 2016-10-25 NOTE — Telephone Encounter (Signed)
Returned pts call.  He is stilll going to have the heart cath.

## 2016-10-25 NOTE — Progress Notes (Signed)
Discharge teaching done with patient and family. All instructions and medications reviewed with patient using teach back. All questions answered. Patient home with family and oxygen and walker.

## 2016-10-25 NOTE — Interval H&P Note (Signed)
Cath Lab Visit (complete for each Cath Lab visit)  Clinical Evaluation Leading to the Procedure:   ACS: No.  Non-ACS:    Anginal Classification: CCS III  Anti-ischemic medical therapy: Minimal Therapy (1 class of medications)  Non-Invasive Test Results: No non-invasive testing performed  Prior CABG: No previous CABG      History and Physical Interval Note:  10/25/2016 1:37 PM  Kurt Diaz  has presented today for surgery, with the diagnosis of angina  The various methods of treatment have been discussed with the patient and family. After consideration of risks, benefits and other options for treatment, the patient has consented to  Procedure(s): Left Heart Cath and Coronary Angiography (N/A) as a surgical intervention .  The patient's history has been reviewed, patient examined, no change in status, stable for surgery.  I have reviewed the patient's chart and labs.  Questions were answered to the patient's satisfaction.     Belva Crome III

## 2016-10-26 DIAGNOSIS — I25110 Atherosclerotic heart disease of native coronary artery with unstable angina pectoris: Secondary | ICD-10-CM | POA: Diagnosis not present

## 2016-10-26 DIAGNOSIS — Z955 Presence of coronary angioplasty implant and graft: Secondary | ICD-10-CM | POA: Diagnosis not present

## 2016-10-26 DIAGNOSIS — E7800 Pure hypercholesterolemia, unspecified: Secondary | ICD-10-CM | POA: Diagnosis not present

## 2016-10-26 DIAGNOSIS — I252 Old myocardial infarction: Secondary | ICD-10-CM | POA: Diagnosis not present

## 2016-10-26 DIAGNOSIS — I119 Hypertensive heart disease without heart failure: Secondary | ICD-10-CM

## 2016-10-26 DIAGNOSIS — Z7982 Long term (current) use of aspirin: Secondary | ICD-10-CM | POA: Diagnosis not present

## 2016-10-26 DIAGNOSIS — Z823 Family history of stroke: Secondary | ICD-10-CM | POA: Diagnosis not present

## 2016-10-26 DIAGNOSIS — I25119 Atherosclerotic heart disease of native coronary artery with unspecified angina pectoris: Secondary | ICD-10-CM | POA: Diagnosis not present

## 2016-10-26 DIAGNOSIS — Z8249 Family history of ischemic heart disease and other diseases of the circulatory system: Secondary | ICD-10-CM | POA: Diagnosis not present

## 2016-10-26 DIAGNOSIS — E785 Hyperlipidemia, unspecified: Secondary | ICD-10-CM | POA: Diagnosis not present

## 2016-10-26 DIAGNOSIS — I259 Chronic ischemic heart disease, unspecified: Secondary | ICD-10-CM | POA: Diagnosis not present

## 2016-10-26 DIAGNOSIS — Z801 Family history of malignant neoplasm of trachea, bronchus and lung: Secondary | ICD-10-CM | POA: Diagnosis not present

## 2016-10-26 LAB — BASIC METABOLIC PANEL
Anion gap: 6 (ref 5–15)
BUN: 15 mg/dL (ref 6–20)
CO2: 26 mmol/L (ref 22–32)
Calcium: 8.8 mg/dL — ABNORMAL LOW (ref 8.9–10.3)
Chloride: 107 mmol/L (ref 101–111)
Creatinine, Ser: 0.94 mg/dL (ref 0.61–1.24)
GFR calc Af Amer: >60 mL/min (ref >60)
GFR calc non Af Amer: >60 mL/min (ref >60)
Glucose, Bld: 104 mg/dL — ABNORMAL HIGH (ref 65–99)
Potassium: 4.3 mmol/L (ref 3.5–5.1)
Sodium: 139 mmol/L (ref 135–145)

## 2016-10-26 LAB — CBC
HCT: 41 % (ref 39.0–52.0)
Hemoglobin: 13.8 g/dL (ref 13.0–17.0)
MCH: 29.8 pg (ref 26.0–34.0)
MCHC: 33.7 g/dL (ref 30.0–36.0)
MCV: 88.6 fL (ref 78.0–100.0)
Platelets: 190 10*3/uL (ref 150–400)
RBC: 4.63 MIL/uL (ref 4.22–5.81)
RDW: 14.2 % (ref 11.5–15.5)
WBC: 8.6 10*3/uL (ref 4.0–10.5)

## 2016-10-26 MED ORDER — TICAGRELOR 90 MG PO TABS: 90.0000 mg | ORAL_TABLET | Freq: Two times a day (BID) | ORAL | 0 refills | Status: DC

## 2016-10-26 MED ORDER — ATORVASTATIN CALCIUM 80 MG PO TABS: 80.0000 mg | ORAL_TABLET | Freq: Every day | ORAL | 6 refills | Status: DC

## 2016-10-26 NOTE — Progress Notes (Signed)
Progress Note  Patient Name: Kurt Diaz Date of Encounter: 10/26/2016  Primary Cardiologist: Dr. [name]   Subjective   Feeling well. No chest pain, sob or palpitations.   Inpatient Medications    Scheduled Meds: . angioplasty book   Does not apply Once  . aspirin  81 mg Oral Daily  . atorvastatin  80 mg Oral q1800  . lisinopril  10 mg Oral QPM  . sodium chloride flush  3 mL Intravenous Q12H  . ticagrelor  90 mg Oral BID   Continuous Infusions:  PRN Meds: sodium chloride, acetaminophen, morphine injection, ondansetron (ZOFRAN) IV, oxyCODONE-acetaminophen, sodium chloride flush   Vital Signs    Vitals:   10/25/16 1922 10/25/16 2000 10/25/16 2046 10/26/16 0235  BP: (!) 162/65 (!) 144/65  (!) 144/67  Pulse: 65 (!) 59 68 64  Resp: 16 19 20 14   Temp: 97.7 F (36.5 C)   97.8 F (36.6 C)  TempSrc: Oral   Oral  SpO2: 98% 95% 98% 96%  Weight:    203 lb 7.8 oz (92.3 kg)  Height:        Intake/Output Summary (Last 24 hours) at 10/26/16 0718 Last data filed at 10/26/16 [id number]  Gross per 24 hour  Intake             1295 ml  Output             2025 ml  Net             -730 ml   Filed Weights   10/25/16 1015 10/26/16 0235  Weight: 194 lb (88 kg) 203 lb 7.8 oz (92.3 kg)    Telemetry    NSR at rate of 60s - Personally Reviewed  ECG    NSR - Personally Reviewed  Physical Exam   GEN: No acute distress.   Neck: No JVD Cardiac: RRR, no murmurs, rubs, or gallops. R radial cath site stable.  Respiratory: Clear to auscultation bilaterally. GI: Soft, nontender, non-distended  MS: No edema; No deformity. Neuro:  Nonfocal  Psych: Normal affect   Labs    Chemistry Recent Labs Lab 10/26/16 0225  NA 139  K 4.3  CL 107  CO2 26  GLUCOSE 104*  BUN 15  CREATININE 0.94  CALCIUM 8.8*  GFRNONAA >60  GFRAA >60  ANIONGAP 6     Hematology Recent Labs Lab 10/26/16 0225  WBC 8.6  RBC 4.63  HGB 13.8  HCT 41.0  MCV 88.6  MCH 29.8  MCHC 33.7  RDW 14.2    PLT 190    Cardiac EnzymesNo results for input(s): TROPONINI in the last 168 hours. No results for input(s): TROPIPOC in the last 168 hours.   BNPNo results for input(s): BNP, PROBNP in the last 168 hours.   DDimer No results for input(s): DDIMER in the last 168 hours.   Radiology    No results found.  Cardiac Studies   Coronary Stent Intervention  Intravascular Pressure Wire/FFR Study  Left Heart Cath and Coronary Angiography  Conclusion    Recurring angina at rest in this 76 year old gentleman with history of right and left coronary drug-eluting stents greater than 10 years ago.  Ostial 70% LAD with FFR identified to be 0.87.  Widely patent proximal LAD stent.  Moderate ostial to proximal RCA and severe distal RCA disease. Widely patent mid RCA stent.  Widely patent/normal circumflex coronary artery.  The right coronary was treated with distal (2.25 mm x 18) and proximal (2.75 x  15) Onyx Resolute drug-eluting stents with reduction in stenosis from 85% and 75% to 0% and 0% respectively with TIMI grade 3 flow.  Overall normal LV function, mild mid anterior wall hypokinesis. EF 45-50%.  Recommendations:  Aspirin and Brilinta for 30 days then switch to aspirin and Plavix.  Discharge in a.m. if no problems.  High intensity statin therapy.  Continue to follow for progression of ostial LAD disease at which time LIMA LAD should be a consideration although depending upon his age, may not be practical.     Patient Profile     76 y.o. male CAD, ischemic heart disease, HLD and syncope came to schedule cath.   He had acute coronary syndrome in 2006 when he [place] and was found to be pulseless and underwent CPR and then underwent emergency cardiac catheterization and had intervention with 2 vessel disease being found. He had angioplasty and stenting to his LAD and angioplasty and stenting of his right coronary artery.   His  nuclear stress test  January 2011 showed a small area of reversible ischemia and as a result he underwent cardiac catheterization by Dr. Acie Fredrickson on 10/13/2009 which showed that the stents were widely patent. There was a proximal 70% stenosis in the diagonal which was too small for PCI.    Last cath 11/30/12 for chest pain showed previous stents were widely patent and no new lesions were seen. Continued medical management was advised.   Seen in clinic 10/24/16 for symptoms concerning for unstable angina. Symptoms were similar to prior MI and plan made for cath next day.   Assessment & Plan    1. CAD - Detailed cath as above. S/p DES to pro and dist RCA. 50% re-stenosis of proximal LAD stent.  Ostial 70% LAD with FFR identified to be 0.87. 70% dist RCA. Plan to treat with  Aspirin and Brilinta for 30 days then switch to aspirin and Plavix. May consider LIMA to LAD if progression of disease.   2. HLD - No results found for requested labs within last 8760 hours. He will need labs during post hospital follow up. - Zocor changed to lipitor 80mg  this admission.   3. HTN - BP relatively stable. Continue lisinopril.   Signed, Leanor Kail, PA  10/26/2016, 7:18 AM

## 2016-10-26 NOTE — Progress Notes (Signed)
CARDIAC REHAB PHASE I   PRE:  Rate/Rhythm: Sinus 68  BP:  Supine: 149/54       SaO2: 95% room air   MODE:  Ambulation: 600 ft   POST:  Rate/Rhythem: 73  BP:    Sitting: 140/79     SaO2: 98% room air  0800-0900 Patient ambulated in hallway without complaints of chest pain  or difficulty. Education completed. Will place phase 2 referral. Kurt Diaz does not think he will be able to participate in phase 2 cardiac rehab due to his work schedule.  Patient has stent card, Reviewed heart healthy diet exercise instructions.   [name] RN BSN

## 2016-10-26 NOTE — Discharge Summary (Signed)
Discharge Summary    Patient ID: Kurt Diaz,  MRN: [id number], DOB/AGE: 76-Jun-1942 76 y.o.  Admit date: 10/25/2016 Discharge date: 10/26/2016  Primary Care Provider: Antony Contras W Primary Cardiologist: Dr. [name]   Discharge Diagnoses    Active Problems:   Ischemic heart disease   Hypercholesterolemia   Benign hypertensive heart disease without heart failure   Coronary artery disease involving native coronary artery of native heart with angina pectoris [facility])   Coronary artery disease   Allergies No Known Allergies  Diagnostic Studies/Procedures    Coronary Stent Intervention  Intravascular Pressure Wire/FFR Study  Left Heart Cath and Coronary Angiography  Conclusion    Recurring angina at rest in this 76 year old gentleman with history of right and left coronary drug-eluting stents greater than 10 years ago.  Ostial 70% LAD with FFR identified to be 0.87.  Widely patent proximal LAD stent.  Moderate ostial to proximal RCA and severe distal RCA disease. Widely patent mid RCA stent.  Widely patent/normal circumflex coronary artery.  The right coronary was treated with distal (2.25 mm x 18) and proximal (2.75 x 15) Onyx Resolute drug-eluting stents with reduction in stenosis from 85% and 75% to 0% and 0% respectively with TIMI grade 3 flow.  Overall normal LV function, mild mid anterior wall hypokinesis. EF 45-50%.  Recommendations:  Aspirin and Brilinta for 30 days then switch to aspirin and Plavix.  Discharge in a.m. if no problems.  High intensity statin therapy.  Continue to follow for progression of ostial LAD disease at which time LIMA LAD should be a consideration although depending upon his age, may not be practical.      History of Present Illness     76 y.o. male CAD, ischemic heart disease, HLD and syncope came to schedule cath.   He had acute coronary syndrome in 2006 when he had [place]ope [place] airport and was found to be  pulseless and underwent CPR and then underwent emergency cardiac catheterization and had intervention with 2 vessel disease being found. He had angioplasty and stenting to his LAD and angioplasty and stenting of his right coronary artery.   His  nuclear stress test January 2011 showed a small area of reversible ischemia and as a result he underwent cardiac catheterization by Dr. Acie Fredrickson on 10/13/2009 which showed that the stents were widely patent. There was a proximal 70% stenosis in the diagonal which was too small for PCI.    Last cath 11/30/12 for chest pain showed previous stents were widely patent and no new lesions were seen. Continued medical management was advised.   Seen in clinic 10/24/16 for symptoms concerning for unstable angina. Symptoms were similar to prior MI and plan made for cath next day.   Hospital Course     Consultants: None  1. CAD - Detailed cath as above. S/p DES to pro and dist RCA. 50% re-stenosis of proximal LAD stent.  Ostial 70% LAD with FFR identified to be 0.87. 70% dist RCA. Plan to treat with  Aspirin and Brilinta for 30 days then switch to aspirin and Plavix per Dr. Tamala Julian. May consider LIMA to LAD if progression of disease.   2. HLD - No results found for requested labs within last 8760 hours. He will need labs during post hospital follow up.  - Zocor changed to lipitor 80mg  this admission.   3. HTN - BP relatively stable. Continue lisinopril.   The patient has been seen by Dr. Debara Pickett today and deemed  ready for discharge home. All follow-up appointments have been scheduled. Discharge medications are listed below.  _____________   Discharge Vitals Blood pressure (!) 142/67, pulse 69, temperature 98.2 F (36.8 C), temperature source Oral, resp. rate 17, height 5\' 8"  (1.727 m), weight 203 lb 7.8 oz (92.3 kg), SpO2 98 %.  Filed Weights   10/25/16 1015 10/26/16 0235  Weight: 194 lb (88 kg) 203 lb 7.8 oz (92.3 kg)    Labs & Radiologic Studies      CBC  Recent Labs  10/26/16 0225  WBC 8.6  HGB 13.8  HCT 41.0  MCV 88.6  PLT 99991111   Basic Metabolic Panel  Recent Labs  10/26/16 0225  NA 139  K 4.3  CL 107  CO2 26  GLUCOSE 104*  BUN 15  CREATININE 0.94  CALCIUM 8.8*   Liver Function Tests No results for input(s): AST, ALT, ALKPHOS, BILITOT, PROT, ALBUMIN in the last 72 hours. No results for input(s): LIPASE, AMYLASE in the last 72 hours. Cardiac Enzymes No results for input(s): CKTOTAL, CKMB, CKMBINDEX, TROPONINI in the last 72 hours. BNP Invalid input(s): POCBNP D-Dimer No results for input(s): DDIMER in the last 72 hours. Hemoglobin A1C No results for input(s): HGBA1C in the last 72 hours. Fasting Lipid Panel No results for input(s): CHOL, HDL, LDLCALC, TRIG, CHOLHDL, LDLDIRECT in the last 72 hours. Thyroid Function Tests No results for input(s): TSH, T4TOTAL, T3FREE, THYROIDAB in the last 72 hours.  Invalid input(s): FREET3  No results found.  Disposition   Pt is being discharged home today in good condition.  Follow-up Plans & Appointments    Follow-up Information    [name], NP. Go on 11/11/2016.   Specialties:  Cardiology, Radiology Why:  @3 :30 post PCI Contact information: [place] [address] [phone]          Discharge Instructions    Diet - low sodium heart healthy    Complete by:  As directed    Discharge instructions    Complete by:  As directed    No driving for 48 hours. No lifting over 5 lbs for 1 week. No sexual activity for 1 week.Keep procedure site clean & dry. If you notice increased pain, swelling, bleeding or pus, call/return!  You may shower, but no soaking baths/hot tubs/pools for 1 week.   Increase activity slowly    Complete by:  As directed       Discharge Medications   Current Discharge Medication List    START taking these medications   Details  atorvastatin (LIPITOR) 80 MG tablet Take 1 tablet (80 mg total) by mouth  daily at 6 PM. Qty: 30 tablet, Refills: 6    ticagrelor (BRILINTA) 90 MG TABS tablet Take 1 tablet (90 mg total) by mouth 2 (two) times daily. Qty: 60 tablet, Refills: 0      CONTINUE these medications which have NOT CHANGED   Details  aspirin 81 MG tablet Take 81 mg by mouth every evening.     ibuprofen (ADVIL,MOTRIN) 200 MG tablet Take 400 mg by mouth daily as needed for headache or moderate pain.    lisinopril (PRINIVIL,ZESTRIL) 10 MG tablet Take 10 mg by mouth every evening.     Multiple Vitamin (MULTIVITAMIN WITH MINERALS) TABS tablet Take 1 tablet by mouth every evening.    nitroGLYCERIN (NITROSTAT) 0.4 MG SL tablet Place 0.4 mg under the tongue every 5 (five) minutes as needed for chest pain.  STOP taking these medications     simvastatin (ZOCOR) 20 MG tablet           Outstanding Labs/Studies   Lipid panel & LFT  Duration of Discharge Encounter   Greater than 30 minutes including physician time.  Signed, Dariush Mcnellis PA-C 10/26/2016, 8:11 AM

## 2016-10-28 ENCOUNTER — Encounter (HOSPITAL_COMMUNITY): Payer: Self-pay | Admitting: Interventional Cardiology

## 2016-10-28 LAB — POCT ACTIVATED CLOTTING TIME: Activated Clotting Time: 296 seconds

## 2016-11-04 ENCOUNTER — Encounter: Payer: Self-pay | Admitting: Cardiology

## 2016-11-11 ENCOUNTER — Encounter: Payer: Self-pay | Admitting: Cardiology

## 2016-11-11 ENCOUNTER — Ambulatory Visit (INDEPENDENT_AMBULATORY_CARE_PROVIDER_SITE_OTHER): Payer: BLUE CROSS/BLUE SHIELD | Admitting: Cardiology

## 2016-11-11 VITALS — BP 122/78 | HR 66 | Ht 68.0 in | Wt 186.8 lb

## 2016-11-11 DIAGNOSIS — I2510 Atherosclerotic heart disease of native coronary artery without angina pectoris: Secondary | ICD-10-CM

## 2016-11-11 DIAGNOSIS — I119 Hypertensive heart disease without heart failure: Secondary | ICD-10-CM

## 2016-11-11 DIAGNOSIS — Z959 Presence of cardiac and vascular implant and graft, unspecified: Secondary | ICD-10-CM | POA: Diagnosis not present

## 2016-11-11 DIAGNOSIS — Z95820 Peripheral vascular angioplasty status with implants and grafts: Secondary | ICD-10-CM

## 2016-11-11 DIAGNOSIS — E7800 Pure hypercholesterolemia, unspecified: Secondary | ICD-10-CM | POA: Diagnosis not present

## 2016-11-11 DIAGNOSIS — I200 Unstable angina: Secondary | ICD-10-CM

## 2016-11-11 MED ORDER — TICAGRELOR 90 MG PO TABS: 90.0000 mg | ORAL_TABLET | Freq: Two times a day (BID) | ORAL | 6 refills | Status: DC

## 2016-11-11 NOTE — Patient Instructions (Addendum)
Medication Instructions:   Your physician recommends that you continue on your current medications as directed. Please refer to the Current Medication list given to you today.   If you need a refill on your cardiac medications before your next appointment, please call your pharmacy.  Labwork: FASTING LFT AND LIPIDS IN 4 WEEKS    Testing/Procedures: NONE ORDERED  TODAY    Follow-Up:  NISHAN IN 3 MONTHS   Any Other Special Instructions Will Be Listed Below (If Applicable).

## 2016-11-11 NOTE — Progress Notes (Signed)
Cardiology Office Note   Date:  11/11/2016   ID:  Kurt Diaz, DOB 1940/11/15, MRN [id number]  PCP:  [name], MD  Cardiologist:  Dr. [name]    Chief Complaint  Patient presents with  . Hospitalization Follow-up    for stent with recent angina      History of Present Illness: Kurt Diaz is a 76 y.o. male who presents for post stent.   I saw pt 10/24/16 -- he had been having some discomfort at rest, + angina.  Discussed with Dr. [name] and planed for cath.  previous  Hx with MI in 2006, when he had [place] and was found to be pulseless and underwent CPR and then underwent emergency cardiac catheterization and had intervention with 2 vessel disease being found. He had angioplasty and stenting to his LAD and angioplasty and stenting of his right coronary artery.   Cardiac cath was done 10/25/16 with ostial 70% LAD with FFR 0.87, patent stent to prox LAD.  Moderate ostial to proximal RCA and severe distal RCA disease. Widely patent mid RCA stent.  The right coronary was treated with distal (2.25 mm x 18) and proximal (2.75 x 15) Onyx Resolute drug-eluting stents with reduction in stenosis from 85% and 75% to 0% and 0% respectively with TIMI grade 3 flow.  Patent LCX.  EF 45-50%.    Plan was for ASA and Brilinta for 30 days then ASA and Plavix.  It was noted his LAD disease needed to be monitored.    Today he is doing well, only twinges of chest pain but very brief.  He did have DOB with singing eased on its own, the SOB is resolving and most likely due to [place]. He is eating healthy and is walking for exercise. Does not plan to attend cardiac rehab.    Past Medical History:  Diagnosis Date  . Chest pain   . Coronary artery disease   . Coronary syndrome, acute ([place]) 2006  . Hyperlipidemia   . Syncope and collapse     Past Surgical History:  Procedure Laterality Date  . CARDIAC CATHETERIZATION  10/13/2009   EF 65%  . CARDIAC CATHETERIZATION  N/A 10/25/2016   Procedure: Left Heart Cath and Coronary Angiography;  Surgeon: Belva Crome, MD;  Location: [place] [facility];  Service: Cardiovascular;  Laterality: N/A;  . CARDIAC CATHETERIZATION N/A 10/25/2016   Procedure: Coronary Stent Intervention;  Surgeon: Belva Crome, MD;  Location: [place] [facility];  Service: Cardiovascular;  Laterality: N/A;  . CARDIAC CATHETERIZATION N/A 10/25/2016   Procedure: Intravascular Pressure Wire/FFR Study;  Surgeon: Belva Crome, MD;  Location: [place] [facility];  Service: Cardiovascular;  Laterality: N/A;  . CARDIOVASCULAR STRESS TEST  09/29/2009   EF 70%  . CORONARY ANGIOPLASTY     STENTING OF HIS LAD, STENTING OF HIS RIGHT CORONARY.  Marland Kitchen US ECHOCARDIOGRAPHY  12/15/2009   EF 55-60%     Current Outpatient Prescriptions  Medication Sig Dispense Refill  . aspirin 81 MG tablet Take 81 mg by mouth every evening.     Marland Kitchen atorvastatin (LIPITOR) 80 MG tablet Take 1 tablet (80 mg total) by mouth daily at 6 PM. 30 tablet 6  . ibuprofen (ADVIL,MOTRIN) 200 MG tablet Take 400 mg by mouth daily as needed for headache or moderate pain.    Marland Kitchen lisinopril (PRINIVIL,ZESTRIL) 10 MG tablet Take 10 mg by mouth every evening.     . Multiple Vitamin (  MULTIVITAMIN WITH MINERALS) TABS tablet Take 1 tablet by mouth every evening.    . nitroGLYCERIN (NITROSTAT) 0.4 MG SL tablet Place 0.4 mg under the tongue every 5 (five) minutes as needed for chest pain.     . ticagrelor (BRILINTA) 90 MG TABS tablet Take 1 tablet (90 mg total) by mouth 2 (two) times daily. 60 tablet 0   No current facility-administered medications for this visit.     Allergies:   Patient has no known allergies.    Social History:  The patient  reports that he has never smoked. He has never used smokeless tobacco. He reports that he does not drink alcohol or use drugs.   Family History:  The patient's family history includes Heart attack in his mother; Lung cancer in his father; Stroke in his mother.      ROS:  General:no colds or fevers, no weight changes Skin:no rashes or ulcers HEENT:no blurred vision, no congestion CV:see HPI PUL:see HPI GI:no diarrhea constipation or melena, no indigestion GU:no hematuria, no dysuria MS:no joint pain, no claudication Neuro:no syncope, no lightheadedness Endo:no diabetes, no thyroid disease  Wt Readings from Last 3 Encounters:  11/11/16 186 lb 12.8 oz (84.7 kg)  10/26/16 203 lb 7.8 oz (92.3 kg)  10/24/16 194 lb 12.8 oz (88.4 kg)     PHYSICAL EXAM: VS:  BP 122/78   Pulse 66   Ht 5\' 8"  (1.727 m)   Wt 186 lb 12.8 oz (84.7 kg)   BMI 28.40 kg/m  , BMI Body mass index is 28.4 kg/m. General:Pleasant affect, NAD Skin:Warm and dry, brisk capillary refill HEENT:normocephalic, sclera clear, mucus membranes moist Neck:supple, no JVD, no bruits  Heart:S1S2 RRR without murmur, gallup, rub or click Lungs:clear without rales, rhonchi, or wheezes [id number], non tender, + BS, do not palpate liver spleen or masses Ext:no lower ext edema, 2+ pedal pulses, 2+ radial pulses Neuro:alert and oriented, MAE, follows commands, + facial symmetry    EKG:  EKG is NOT ordered today.  Recent Labs: 10/26/2016: BUN 15; Creatinine, Ser 0.94; Hemoglobin 13.8; Platelets 190; Potassium 4.3; Sodium 139    Lipid Panel    Component Value Date/Time   CHOL 135 12/30/2014 0741   TRIG 103.0 12/30/2014 0741   HDL 35.30 (L) 12/30/2014 0741   CHOLHDL 4 12/30/2014 0741   VLDL 20.6 12/30/2014 0741   LDLCALC 79 12/30/2014 0741       Other studies Reviewed: Additional studies/ records that were reviewed today include:  cardiac cath 10/25/16 Conclusion    Recurring angina at rest in this 76 year old gentleman with history of right and left coronary drug-eluting stents greater than 10 years ago.  Ostial 70% LAD with FFR identified to be 0.87.  Widely patent proximal LAD stent.  Moderate ostial to proximal RCA and severe distal RCA disease. Widely patent mid RCA  stent.  Widely patent/normal circumflex coronary artery.  The right coronary was treated with distal (2.25 mm x 18) and proximal (2.75 x 15) Onyx Resolute drug-eluting stents with reduction in stenosis from 85% and 75% to 0% and 0% respectively with TIMI grade 3 flow.  Overall normal LV function, mild mid anterior wall hypokinesis. EF 45-50%.  Recommendations:  Aspirin and Brilinta for 30 days then switch to aspirin and Plavix.  Discharge in a.m. if no problems.  High intensity statin therapy.  Continue to follow for progression of ostial LAD disease at which time LIMA LAD should be a consideration although depending upon his age, may not be practical.  ASSESSMENT AND PLAN:  1.  Recent angina requiring cath and new stents to prox RCA and distal to previous stent.  He has residual distal   RCA of 70 %.  His LAD stent was patent and 70% ostial LAD stenosis that per Dr. Tamala Julian would need to be monitored and possible CABG.  Pt, his wife and I discussed today.  He knows to call if any angina. - has had a twinge of discomfort but minimal.  He also with singing had SOB.  Resolved on its own and has pretty much resolved.  - Dr. Tamala Julian had mentioned changing brilinta to Plavix after 30 days to decrease bleeding.  If pt is stable on Brilinta and he can afford Dr. Tamala Julian would like him to stay on it for 3 months then change to Plavix.  If pt needs to change to plavix prior to 3 months he should have a 300 mg load then 75 mg daily.  He will call if unable to afford or any increase of SOB. -not on BB due to bradycardia -follow up with Dr. [name] in 3 months.  - pt does not plan to go to cardiac rehab.   2. CAD with hx of previous cardiac arrest with MI and requiring stent to LAD.  3.. HLD now on lipitor 80 due to 70% ostial LAD stenosis.  Will check lipids and hepatic in 4 weeks.  4.   HTN controlled    5. LV dysfunction monitor    Current medicines are reviewed with the patient today.   The patient Has no concerns regarding medicines.  The following changes have been made:  See above Labs/ tests ordered today include:see above  Disposition:   FU:  see above  Signed, [name], NP  11/11/2016 3:33 PM    [facility] [place], [place], [place], [place] Phone: [phone]; Fax: [phone]

## 2016-11-18 DIAGNOSIS — C61 Malignant neoplasm of prostate: Secondary | ICD-10-CM | POA: Diagnosis not present

## 2016-11-25 DIAGNOSIS — N529 Male erectile dysfunction, unspecified: Secondary | ICD-10-CM | POA: Diagnosis not present

## 2016-11-25 DIAGNOSIS — R3912 Poor urinary stream: Secondary | ICD-10-CM | POA: Diagnosis not present

## 2016-11-25 DIAGNOSIS — C61 Malignant neoplasm of prostate: Secondary | ICD-10-CM | POA: Diagnosis not present

## 2016-11-25 DIAGNOSIS — N401 Enlarged prostate with lower urinary tract symptoms: Secondary | ICD-10-CM | POA: Diagnosis not present

## 2016-11-29 ENCOUNTER — Telehealth (HOSPITAL_COMMUNITY): Payer: Self-pay | Admitting: Family Medicine

## 2016-11-29 NOTE — Telephone Encounter (Signed)
Patient has [place] and [employer], insurance benefits verified. [employer]  - no co-payment, deductible $2900/$2900 has been met, out of pocket $7150/$7150 has been met, 20% co-insurance, no pre-cert and no limits on visits. Passport/reference [phone]. Information left for [place] to review.

## 2016-12-09 ENCOUNTER — Other Ambulatory Visit: Payer: BLUE CROSS/BLUE SHIELD

## 2016-12-16 ENCOUNTER — Other Ambulatory Visit: Payer: BLUE CROSS/BLUE SHIELD | Admitting: *Deleted

## 2016-12-16 DIAGNOSIS — E7800 Pure hypercholesterolemia, unspecified: Secondary | ICD-10-CM

## 2016-12-17 LAB — HEPATIC FUNCTION PANEL
ALT: 27 IU/L (ref 0–44)
AST: 21 IU/L (ref 0–40)
Albumin: 4.1 g/dL (ref 3.5–4.8)
Alkaline Phosphatase: 56 IU/L (ref 39–117)
Bilirubin Total: 0.7 mg/dL (ref 0.0–1.2)
Bilirubin, Direct: 0.16 mg/dL (ref 0.00–0.40)
Total Protein: 6.7 g/dL (ref 6.0–8.5)

## 2016-12-17 LAB — LIPID PANEL
Chol/HDL Ratio: 2.9 ratio units (ref 0.0–5.0)
Cholesterol, Total: 99 mg/dL — ABNORMAL LOW (ref 100–199)
HDL: 34 mg/dL — ABNORMAL LOW (ref >39)
LDL Calculated: 50 mg/dL (ref 0–99)
Triglycerides: 75 mg/dL (ref 0–149)
VLDL Cholesterol Cal: 15 mg/dL (ref 5–40)

## 2016-12-23 ENCOUNTER — Emergency Department (HOSPITAL_COMMUNITY)
Admission: EM | Admit: 2016-12-23 | Discharge: 2016-12-23 | Disposition: A | Discharge status: Home / Self Care | Payer: BLUE CROSS/BLUE SHIELD | Attending: Emergency Medicine | Admitting: Emergency Medicine

## 2016-12-23 ENCOUNTER — Emergency Department (HOSPITAL_COMMUNITY): Payer: BLUE CROSS/BLUE SHIELD

## 2016-12-23 ENCOUNTER — Telehealth: Payer: Self-pay | Admitting: Cardiovascular Disease

## 2016-12-23 ENCOUNTER — Encounter (HOSPITAL_COMMUNITY): Payer: Self-pay

## 2016-12-23 ENCOUNTER — Encounter: Payer: Self-pay | Admitting: Physician Assistant

## 2016-12-23 DIAGNOSIS — Z955 Presence of coronary angioplasty implant and graft: Secondary | ICD-10-CM | POA: Diagnosis not present

## 2016-12-23 DIAGNOSIS — R079 Chest pain, unspecified: Secondary | ICD-10-CM | POA: Diagnosis not present

## 2016-12-23 DIAGNOSIS — Z8546 Personal history of malignant neoplasm of prostate: Secondary | ICD-10-CM | POA: Insufficient documentation

## 2016-12-23 DIAGNOSIS — I119 Hypertensive heart disease without heart failure: Secondary | ICD-10-CM | POA: Diagnosis not present

## 2016-12-23 DIAGNOSIS — I2510 Atherosclerotic heart disease of native coronary artery without angina pectoris: Secondary | ICD-10-CM | POA: Diagnosis not present

## 2016-12-23 DIAGNOSIS — Z7982 Long term (current) use of aspirin: Secondary | ICD-10-CM | POA: Insufficient documentation

## 2016-12-23 DIAGNOSIS — Z79899 Other long term (current) drug therapy: Secondary | ICD-10-CM | POA: Insufficient documentation

## 2016-12-23 LAB — CBC
HCT: 41.9 % (ref 39.0–52.0)
Hemoglobin: 14 g/dL (ref 13.0–17.0)
MCH: 29.9 pg (ref 26.0–34.0)
MCHC: 33.4 g/dL (ref 30.0–36.0)
MCV: 89.5 fL (ref 78.0–100.0)
Platelets: 234 10*3/uL (ref 150–400)
RBC: 4.68 MIL/uL (ref 4.22–5.81)
RDW: 14.7 % (ref 11.5–15.5)
WBC: 9.4 10*3/uL (ref 4.0–10.5)

## 2016-12-23 LAB — BASIC METABOLIC PANEL
Anion gap: 9 (ref 5–15)
BUN: 20 mg/dL (ref 6–20)
CO2: 23 mmol/L (ref 22–32)
Calcium: 9 mg/dL (ref 8.9–10.3)
Chloride: 106 mmol/L (ref 101–111)
Creatinine, Ser: 1.22 mg/dL (ref 0.61–1.24)
GFR calc Af Amer: >60 mL/min (ref >60)
GFR calc non Af Amer: 56 mL/min — ABNORMAL LOW (ref >60)
Glucose, Bld: 97 mg/dL (ref 65–99)
Potassium: 4.3 mmol/L (ref 3.5–5.1)
Sodium: 138 mmol/L (ref 135–145)

## 2016-12-23 LAB — I-STAT TROPONIN, ED
Troponin i, poc: 0 ng/mL (ref 0.00–0.08)
Troponin i, poc: 0 ng/mL (ref 0.00–0.08)

## 2016-12-23 MED ORDER — NITROGLYCERIN 0.4 MG SL SUBL: 0.4000 mg | SUBLINGUAL_TABLET | SUBLINGUAL | 3 refills | Status: DC | PRN

## 2016-12-23 MED ORDER — PANTOPRAZOLE SODIUM 40 MG PO TBEC: 40.0000 mg | DELAYED_RELEASE_TABLET | Freq: Every day | ORAL | 1 refills | Status: DC

## 2016-12-23 NOTE — Telephone Encounter (Signed)
Patient is at the airport calling about having chest pain earlier. Patient is about to board an airplane. Patient stated he was just sitting and chest pain started and lasted about 5 minutes. Patient stated the last two night, he has had chest pain that lasted for 10 minutes and radiated to his jaw. Patient stated he took a nitroglycerin, but he thinks it did not help. Patient stated he has no pressure or SOB. Patient stated that it was a sharp pain and very localized. Patient stated with each time he was resting and not active. Consulted [name] NP, who patient saw 11/11/16, she suggest patient not to get on the plane if he feels like he did before his heart cath. Recommend patient to be evaluated because it could be GI related or his heart. Patient stated he is not going to get on the plane and is going to drive back to [place]. Encouraged patient to seek medical attention at ED for further evaluation.

## 2016-12-23 NOTE — ED Notes (Signed)
REpaged cards attending [facility]

## 2016-12-23 NOTE — Discharge Instructions (Signed)
Get your prescriptions for nitroglycerin and Protonix filled today or tomorrow. Call Dr.Nishan's office tomorrow in order to determine when they want to see you for your next follow-up appointment. Return if concern for any reason

## 2016-12-23 NOTE — ED Triage Notes (Signed)
Pt reports he began having chest pain last night and continued today. He reports some radiation into the neck but denies any other associated symptoms. Recent stent placement in Feb.

## 2016-12-23 NOTE — Telephone Encounter (Signed)
New Message  Pt c/o of Chest Pain: STAT if CP now or developed within 24 hours  1. Are you having CP right now? Yes  2. Are you experiencing any other symptoms (ex. SOB, nausea, vomiting, sweating)? Ache in upper jaw  3. How long have you been experiencing CP? Past two day  4. Is your CP continuous or coming and going? Coming and going  5. Have you taken Nitroglycerin? No ?

## 2016-12-23 NOTE — Consult Note (Signed)
Primary Physician: Primary Cardiologist:  Kurt Diaz  Pt presents to ED with CP   HPI:  Pt is a 76 yo with history CAD.  MI in 2006  (required CPR in airport) Cath in [place] with 2 V CAD  Underwent PTCA/stent to LAD nd RCA Cath on 10/25/16:  70% LAD with FFR of 0.87.  Patent stent to LAD  Moderate ostial /prox RCA dz  Severe distal RCA dz.  Patent mid RCA stent .  Patent LCx.  Pt underwent PTCA/DEs to prox and distal RCA.   LVEF 45 to 50%  REcomm ASA nd Brilinta for 30 day then ASA /Plavix   Not on b blocker due to bradycardia   Pt Seen in clinic on 2/19 by L Ingold  Doing good  [place]winges of CP  Brief  Discussed with H SMith  Plan to extend [place] for 3 months   This weekend he did a lot of work in the yard(mow, rake, sweep)  No problems Sat night woke up about 2 am with sharp CP   Went awy on own  Not pleuritic  No SOB  Sunday night similar spell   Today went to [place]  In airport about to board plane for [employer].  Had sharp substernal CP  Not pleuritic  No SOB  Eased off on own NOt as intense as other 2 episodes  Lasted about 7 min   Called office   Told he should go to ER  Walked to airport shuttle and then drove home    Has not had any further pain  No SOB    Hx of reflux in past   Denies now   No symptoms prior to VF arrest in 2006   No F/C  No cough  No pain with motion  No N/V  No diarrhea      Past Medical History:  Diagnosis Date  . Chest pain   . Coronary artery disease   . Coronary syndrome, acute ([place]) 2006  . Hyperlipidemia   . Syncope and collapse      (Not in a hospital admission)     Infusions:   No Known Allergies  Social History   Social History  . Marital status: Married    Spouse name: Kurt Diaz  . Number of children: Kurt Diaz  . Years of education: Kurt Diaz   Occupational History  . Not on file.   Social History Main Topics  . Smoking status: Never Smoker  . Smokeless tobacco: Never Used  . Alcohol use No  . Drug use: No  . Sexual activity: Not on file    Other Topics Concern  . Not on file   Social History Narrative  . No narrative on file    Family History  Problem Relation Age of Onset  . Heart attack Mother   . Stroke Mother   . Lung cancer Father     REVIEW OF SYSTEMS:  All systems reviewed  Negative to the above problem except as noted above.    PHYSICAL EXAM: Vitals:   12/23/16 1715 12/23/16 1730  BP: 135/73 122/65  Pulse: 64 62  Resp: (!) 22 20  Temp:      No intake or output data in the 24 hours ending 12/23/16 1737  General:  Well appearing. No respiratory difficulty HEENT: normal Neck: supple. no JVD. Carotids 2+ bilat; no bruits. No lymphadenopathy or thryomegaly appreciated. Cor: PMI nondisplaced. Regular rate & rhythm. No rubs, gallops or murmurs. Lungs: clear Chest  No pain with palpation   Abdomen: soft, nontender, nondistended. No hepatosplenomegaly. No bruits or masses. Good bowel sounds. Extremities: no cyanosis, clubbing, rash, edema Neuro: alert & oriented x 3, cranial nerves grossly intact. moves all 4 extremities w/o difficulty. Affect pleasant.  ECG:  SR 61 bpm    Results for orders placed or performed during the hospital encounter of 12/23/16 (from the past 24 hour(s))  Basic metabolic panel     Status: Abnormal   Collection Time: 12/23/16  3:11 PM  Result Value Ref Range   Sodium 138 135 - 145 mmol/L   Potassium 4.3 3.5 - 5.1 mmol/L   Chloride 106 101 - 111 mmol/L   CO2 23 22 - 32 mmol/L   Glucose, Bld 97 65 - 99 mg/dL   BUN 20 6 - 20 mg/dL   Creatinine, Ser 1.22 0.61 - 1.24 mg/dL   Calcium 9.0 8.9 - 10.3 mg/dL   GFR calc non Af Amer 56 (L) >60 mL/min   GFR calc Af Amer >60 >60 mL/min   Anion gap 9 5 - 15  CBC     Status: None   Collection Time: 12/23/16  3:11 PM  Result Value Ref Range   WBC 9.4 4.0 - 10.5 K/uL   RBC 4.68 4.22 - 5.81 MIL/uL   Hemoglobin 14.0 13.0 - 17.0 g/dL   HCT 41.9 39.0 - 52.0 %   MCV 89.5 78.0 - 100.0 fL   MCH 29.9 26.0 - 34.0 pg   MCHC 33.4 30.0 - 36.0  g/dL   RDW 14.7 11.5 - 15.5 %   Platelets 234 150 - 400 K/uL  I-stat troponin, ED     Status: None   Collection Time: 12/23/16  3:44 PM  Result Value Ref Range   Troponin i, poc 0.00 0.00 - 0.08 ng/mL   Comment 3           Dg Chest 2 View  Result Date: 12/23/2016 CLINICAL DATA:  Sharp midchest and jaw pain this morning. History of previous MI with coronary stent placement in the past. EXAM: CHEST  2 VIEW COMPARISON:  PA and lateral chest x-ray of December 15, 2009 FINDINGS: The lungs are well-expanded and clear. The heart and pulmonary vascularity are normal. The mediastinum is normal in width. There is no pleural effusion. The trachea is midline. The bony thorax exhibits no acute abnormality. IMPRESSION: There is no pneumonia, CHF, nor other acute cardiopulmonary abnormality. Electronically Signed   By: Kurt  Diaz M.D.   On: 12/23/2016 16:05     ASSESSMENT:  Pt is a 76 yo with CAD  Recent intervention to RCA  (stent x 2)  Has had 3 episodes of CP over past 3 days  None occurred with activity  No SOB   Did not have NTG to take   Drove from [place] to ED   Pain free since earlier today Exam unremarkable EKG without acute changes  Trop 0    I do not think spells are cardiac in origin  ? GI with subclinical reflux     CXR neg     If 2dn troponin negative I would recomm trial of Protonix   Also fill NTG Continue other meds   F/U in clinic   Pt is agreeable with plan    2  CAD  Continue ASA and Brilinta  3  HL  Continue lipitor

## 2016-12-23 NOTE — ED Provider Notes (Signed)
[facility] DEPT Provider Note   CSN: [id number] Arrival date & time: 12/23/16  1502     History   Chief Complaint Chief Complaint  Patient presents with  . Chest Pain    HPI Kurt Diaz is a 76 y.o. male.Plains of anterior chest pain onset 2 days ago. One episode lasting approximately 10 minutes while at rest typical of angina pain he's had in the past. He had another episode lasting 10 minutes yesterday and a third episode which was briefer today while at rest while waiting at the airport to go away on a trip to [place]. He is presently asymptomatic. No treatment prior to coming here. No other associated symptoms. He denies noncompliance with medications. He treated himself with his usual dose of aspirin and Brilinta today. Oh other associated symptoms  HPI  Past Medical History:  Diagnosis Date  . Chest pain   . Coronary artery disease   . Coronary syndrome, acute ([place]) 2006  . Hyperlipidemia   . Syncope and collapse     Patient Active Problem List   Diagnosis Date Noted  . Coronary artery disease 10/25/2016  . Coronary artery disease involving native coronary artery of native heart with angina pectoris ([place])   . Prostate cancer ([place]) 12/18/2012  . BPH (benign prostatic hyperplasia) 12/18/2012  . Benign hypertensive heart disease without heart failure 12/18/2012  . Ischemic heart disease 05/03/2011  . Hypercholesterolemia 05/03/2011    Past Surgical History:  Procedure Laterality Date  . CARDIAC CATHETERIZATION  10/13/2009   EF 65%  . CARDIAC CATHETERIZATION N/A 10/25/2016   Procedure: Left Heart Cath and Coronary Angiography;  Surgeon: Belva Crome, MD;  Location: [facility];  Service: Cardiovascular;  Laterality: N/A;  . CARDIAC CATHETERIZATION N/A 10/25/2016   Procedure: Coronary Stent Intervention;  Surgeon: Belva Crome, MD;  Location: [facility];  Service: Cardiovascular;  Laterality: N/A;  . CARDIAC CATHETERIZATION N/A 10/25/2016   Procedure: Intravascular Pressure Wire/FFR Study;  Surgeon: Belva Crome, MD;  Location: [facility];  Service: Cardiovascular;  Laterality: N/A;  . CARDIOVASCULAR STRESS TEST  09/29/2009   EF 70%  . CORONARY ANGIOPLASTY     STENTING OF HIS LAD, STENTING OF HIS RIGHT CORONARY.  Marland Kitchen US ECHOCARDIOGRAPHY  12/15/2009   EF 55-60%       Home Medications    Prior to Admission medications   Medication Sig Start Date End Date Taking? Authorizing Provider  aspirin EC 81 MG tablet Take 81 mg by mouth daily after supper.   Yes Historical Provider, MD  atorvastatin (LIPITOR) 80 MG tablet Take 1 tablet (80 mg total) by mouth daily at 6 PM. Patient taking differently: Take 80 mg by mouth daily after supper.  10/26/16  Yes Bhavinkumar Bhagat, PA  ibuprofen (ADVIL,MOTRIN) 200 MG tablet Take 400 mg by mouth 2 (two) times daily as needed for headache or moderate pain.    Yes Historical Provider, MD  lisinopril (PRINIVIL,ZESTRIL) 10 MG tablet Take 10 mg by mouth daily after supper.  06/20/16  Yes Historical Provider, MD  Multiple Vitamin (MULTIVITAMIN WITH MINERALS) TABS tablet Take 1 tablet by mouth daily after supper.    Yes Historical Provider, MD  nitroGLYCERIN (NITROSTAT) 0.4 MG SL tablet Place 0.4 mg under the tongue every 5 (five) minutes as needed for chest pain.    Yes Historical Provider, MD  ticagrelor (BRILINTA) 90 MG TABS tablet Take 1 tablet (90 mg total) by mouth 2 (two) times daily. Patient  taking differently: Take 90 mg by mouth 2 (two) times daily after a meal.  11/11/16  Yes Isaiah Serge, NP    Family History Family History  Problem Relation Age of Onset  . Heart attack Mother   . Stroke Mother   . Lung cancer Father     Social History Social History  Substance Use Topics  . Smoking status: Never Smoker  . Smokeless tobacco: Never Used  . Alcohol use No     Allergies   Patient has no known allergies.   Review of Systems Review of Systems  Constitutional: Negative.     HENT: Negative.   Respiratory: Negative.   Cardiovascular: Positive for chest pain.  Gastrointestinal: Negative.   Musculoskeletal: Negative.   Skin: Negative.   Neurological: Negative.   Psychiatric/Behavioral: Negative.   All other systems reviewed and are negative.    Physical Exam Updated Vital Signs BP (!) 141/76   Pulse 62   Temp 97.3 F (36.3 C) (Oral)   Resp 17   SpO2 96%   Physical Exam  Constitutional: He appears well-developed and well-nourished.  HENT:  Head: Normocephalic and atraumatic.  Eyes: Conjunctivae are normal. Pupils are equal, round, and reactive to light.  Neck: Neck supple. No tracheal deviation present. No thyromegaly present.  Cardiovascular: Normal rate and regular rhythm.   No murmur heard. Pulmonary/Chest: Effort normal and breath sounds normal.  Abdominal: Soft. Bowel sounds are normal. He exhibits no distension. There is no tenderness.  Musculoskeletal: Normal range of motion. He exhibits no edema or tenderness.  Neurological: He is alert. Coordination normal.  Skin: Skin is warm and dry. No rash noted.  Psychiatric: He has a normal mood and affect.  Nursing note and vitals reviewed.    ED Treatments / Results  Labs (all labs ordered are listed, but only abnormal results are displayed) Labs Reviewed  BASIC METABOLIC PANEL - Abnormal; Notable for the following:       Result Value   GFR calc non Af Amer 56 (*)    All other components within normal limits  CBC  I-STAT TROPOININ, ED    EKG  EKG Interpretation  Date/Time:  Monday December 23 2016 15:04:52 EDT Ventricular Rate:  61 PR Interval:  128 QRS Duration: 90 QT Interval:  406 QTC Calculation: 408 R Axis:   56 Text Interpretation:  Normal sinus rhythm Normal ECG No significant change since last tracing Confirmed by Winfred Leeds  MD, Meagan Ancona [phone]) on 12/23/2016 4:55:54 PM       Radiology Dg Chest 2 View  Result Date: 12/23/2016 CLINICAL DATA:  Sharp midchest and jaw pain  this morning. History of previous MI with coronary stent placement in the past. EXAM: CHEST  2 VIEW COMPARISON:  PA and lateral chest x-ray of December 15, 2009 FINDINGS: The lungs are well-expanded and clear. The heart and pulmonary vascularity are normal. The mediastinum is normal in width. There is no pleural effusion. The trachea is midline. The bony thorax exhibits no acute abnormality. IMPRESSION: There is no pneumonia, CHF, nor other acute cardiopulmonary abnormality. Electronically Signed   By: David  Martinique M.D.   On: 12/23/2016 16:05   Results for orders placed or performed during the hospital encounter of 31/51/76  Basic metabolic panel  Result Value Ref Range   Sodium 138 135 - 145 mmol/L   Potassium 4.3 3.5 - 5.1 mmol/L   Chloride 106 101 - 111 mmol/L   CO2 23 22 - 32 mmol/L   Glucose, Bld  97 65 - 99 mg/dL   BUN 20 6 - 20 mg/dL   Creatinine, Ser 1.22 0.61 - 1.24 mg/dL   Calcium 9.0 8.9 - 10.3 mg/dL   GFR calc non Af Amer 56 (L) >60 mL/min   GFR calc Af Amer >60 >60 mL/min   Anion gap 9 5 - 15  CBC  Result Value Ref Range   WBC 9.4 4.0 - 10.5 K/uL   RBC 4.68 4.22 - 5.81 MIL/uL   Hemoglobin 14.0 13.0 - 17.0 g/dL   HCT 41.9 39.0 - 52.0 %   MCV 89.5 78.0 - 100.0 fL   MCH 29.9 26.0 - 34.0 pg   MCHC 33.4 30.0 - 36.0 g/dL   RDW 14.7 11.5 - 15.5 %   Platelets 234 150 - 400 K/uL  I-stat troponin, ED  Result Value Ref Range   Troponin i, poc 0.00 0.00 - 0.08 ng/mL   Comment 3          I-stat troponin, ED  Result Value Ref Range   Troponin i, poc 0.00 0.00 - 0.08 ng/mL   Comment 3           Dg Chest 2 View  Result Date: 12/23/2016 CLINICAL DATA:  Sharp midchest and jaw pain this morning. History of previous MI with coronary stent placement in the past. EXAM: CHEST  2 VIEW COMPARISON:  PA and lateral chest x-ray of December 15, 2009 FINDINGS: The lungs are well-expanded and clear. The heart and pulmonary vascularity are normal. The mediastinum is normal in width. There is no pleural  effusion. The trachea is midline. The bony thorax exhibits no acute abnormality. IMPRESSION: There is no pneumonia, CHF, nor other acute cardiopulmonary abnormality. Electronically Signed   By: David  Martinique M.D.   On: 12/23/2016 16:05    Procedures Procedures (including critical care time)  Medications Ordered in ED Medications - No data to display chest x-ray viewed by me. Dr. Harrington Challenger from cardiology service consulted and evaluated patient in ED. She feels the patient can go home with 2 negative troponins. At 7:50 PM he remains asymptomatic and pain-free. Dr. Harrington Challenger has arranged for him to get Protonix and sublingual nitroglycerin at his own pharmacy. Patient instructed to call [facility] heart care office tomorrow to arrange for outpatient follow-up  Initial Impression / Assessment and Plan / ED Course  I have reviewed the triage vital signs and the nursing notes.  Pertinent labs & imaging results that were available during my care of the patient were reviewed by me and considered in my medical decision making (see chart for details).       Final Clinical Impressions(s) / ED Diagnoses  Diagnosis nonspecific chest pain Final diagnoses:  None    New Prescriptions New Prescriptions   No medications on file     Orlie Dakin, MD 12/23/16 1956

## 2016-12-23 NOTE — ED Notes (Signed)
Pt stable, understands discharge instructions, and reasons for return.   

## 2016-12-25 ENCOUNTER — Other Ambulatory Visit: Payer: Self-pay | Admitting: Cardiovascular Disease

## 2016-12-25 ENCOUNTER — Other Ambulatory Visit: Payer: Self-pay | Admitting: Physician Assistant

## 2016-12-27 ENCOUNTER — Ambulatory Visit: Payer: BLUE CROSS/BLUE SHIELD | Admitting: Physician Assistant

## 2017-01-16 NOTE — Progress Notes (Signed)
Cardiology Office Note   Date:  01/20/2017   ID:  Kurt Diaz, DOB 1940-11-10, MRN [id number]  PCP:  Kurt Kroner, MD  Cardiologist:  Dr. [name]    Chief Complaint  Patient presents with  . Coronary Artery Disease      History of Present Illness: Kurt Diaz is a 76 y.o. male who presents for f/u of CAD   Hx with MI in 2006, when he had syncope in the [facility] airport and was found to be pulseless and underwent CPR and then underwent emergency cardiac catheterization and had intervention with 2 vessel disease being found. He had angioplasty and stenting to his LAD and angioplasty and stenting of his right coronary artery.   Cardiac cath was done 10/25/16 with ostial 70% LAD with FFR 0.87, patent stent to prox LAD.  Moderate ostial to proximal RCA and severe distal RCA disease. Widely patent mid RCA stent.  The right coronary was treated with distal (2.25 mm x 18) and proximal (2.75 x 15) Onyx Resolute drug-eluting stents with reduction in stenosis from 85% and 75% to 0% and 0% respectively with TIMI grade 3 flow.  Patent LCX.  EF 45-50%.    Plan was for ASA and Brilinta for 30 days then ASA and Plavix.  It was noted his LAD disease needed to be monitored.    Had SSC[facility] getting on plane 12/23/16 Aborted flight Seen by Dr Harrington Challenger in ER and r/o with no ECG changes Started on protonix ? Reflux. Not helpful Still getting resting pains right chest and jaw that are not relieved With nitro   Reviewed his films from 10/25/16:  Small vessels Good result with distal RCA stent diffuse disease PLA LAO caudal view showd 70% ostial LAD lesion but FFR .73    Past Medical History:  Diagnosis Date  . Chest pain   . Coronary artery disease   . Coronary syndrome, acute ([place]) 2006  . Hyperlipidemia   . Syncope and collapse     Past Surgical History:  Procedure Laterality Date  . CARDIAC CATHETERIZATION  10/13/2009   EF 65%  . CARDIAC CATHETERIZATION N/A 10/25/2016   Procedure: Left Heart  Cath and Coronary Angiography;  Surgeon: Belva Crome, MD;  Location: [facility];  Service: Cardiovascular;  Laterality: N/A;  . CARDIAC CATHETERIZATION N/A 10/25/2016   Procedure: Coronary Stent Intervention;  Surgeon: Belva Crome, MD;  Location: [facility];  Service: Cardiovascular;  Laterality: N/A;  . CARDIAC CATHETERIZATION N/A 10/25/2016   Procedure: Intravascular Pressure Wire/FFR Study;  Surgeon: Belva Crome, MD;  Location: [facility];  Service: Cardiovascular;  Laterality: N/A;  . CARDIOVASCULAR STRESS TEST  09/29/2009   EF 70%  . CORONARY ANGIOPLASTY     STENTING OF HIS LAD, STENTING OF HIS RIGHT CORONARY.  Kurt Diaz US ECHOCARDIOGRAPHY  12/15/2009   EF 55-60%     Current Outpatient Prescriptions  Medication Sig Dispense Refill  . aspirin EC 81 MG tablet Take 81 mg by mouth daily after supper.    Kurt Diaz atorvastatin (LIPITOR) 80 MG tablet Take 1 tablet (80 mg total) by mouth daily at 6 PM. 30 tablet 6  . BRILINTA 90 MG TABS tablet take 1 tablet by mouth twice a day 60 tablet 0  . ibuprofen (ADVIL,MOTRIN) 200 MG tablet Take 400 mg by mouth 2 (two) times daily as needed for headache or moderate pain.     Kurt Diaz lisinopril (PRINIVIL,ZESTRIL) 10 MG tablet Take 10 mg  by mouth daily after supper.     . Multiple Vitamin (MULTIVITAMIN WITH MINERALS) TABS tablet Take 1 tablet by mouth daily after supper.     . nitroGLYCERIN (NITROSTAT) 0.4 MG SL tablet Place 1 tablet (0.4 mg total) under the tongue every 5 (five) minutes as needed for chest pain. 25 tablet 3  . pantoprazole (PROTONIX) 40 MG tablet Take 1 tablet (40 mg total) by mouth daily. 90 tablet 1   No current facility-administered medications for this visit.     Allergies:   Patient has no known allergies.    Social History:  The patient  reports that he has never smoked. He has never used smokeless tobacco. He reports that he does not drink alcohol or use drugs.   Family History:  The patient's family history includes  Heart attack in his mother; Lung cancer in his father; Stroke in his mother.     ROS:  General:no colds or fevers, no weight changes Skin:no rashes or ulcers HEENT:no blurred vision, no congestion CV:see HPI PUL:see HPI GI:no diarrhea constipation or melena, no indigestion GU:no hematuria, no dysuria MS:no joint pain, no claudication Neuro:no syncope, no lightheadedness Endo:no diabetes, no thyroid disease  Wt Readings from Last 3 Encounters:  01/20/17 84 kg (185 lb 1.9 oz)  11/11/16 84.7 kg (186 lb 12.8 oz)  10/26/16 92.3 kg (203 lb 7.8 oz)     PHYSICAL EXAM: VS:  BP 130/60   Pulse 66   Ht 5\' 8"  (1.727 m)   Wt 84 kg (185 lb 1.9 oz)   SpO2 98%   BMI 28.15 kg/m  , BMI Body mass index is 28.15 kg/m. General:Pleasant affect, NAD Skin:Warm and dry, brisk capillary refill HEENT:normocephalic, sclera clear, mucus membranes moist Neck:supple, no JVD, no bruits  Heart:S1S2 RRR without murmur, gallup, rub or click Lungs:clear without rales, rhonchi, or wheezes XHF:SFSE, non tender, + BS, do not palpate liver spleen or masses Ext:no lower ext edema, 2+ pedal pulses, 2+ radial pulses Neuro:alert and oriented, MAE, follows commands, + facial symmetry    EKG:   .12/24/16 SR rate 68 normal ECG   Recent Labs: 12/16/2016: ALT 27 12/23/2016: BUN 20; Creatinine, Ser 1.22; Hemoglobin 14.0; Platelets 234; Potassium 4.3; Sodium 138    Lipid Panel    Component Value Date/Time   CHOL 99 (L) 12/16/2016 0730   TRIG 75 12/16/2016 0730   HDL 34 (L) 12/16/2016 0730   CHOLHDL 2.9 12/16/2016 0730   CHOLHDL 4 12/30/2014 0741   VLDL 20.6 12/30/2014 0741   LDLCALC 50 12/16/2016 0730       Other studies Reviewed: Additional studies/ records that were reviewed today include:  cardiac cath 10/25/16 Conclusion    Recurring angina at rest in this 76 year old gentleman with history of right and left coronary drug-eluting stents greater than 10 years ago.  Ostial 70% LAD with FFR identified  to be 0.87.  Widely patent proximal LAD stent.  Moderate ostial to proximal RCA and severe distal RCA disease. Widely patent mid RCA stent.  Widely patent/normal circumflex coronary artery.  The right coronary was treated with distal (2.25 mm x 18) and proximal (2.75 x 15) Onyx Resolute drug-eluting stents with reduction in stenosis from 85% and 75% to 0% and 0% respectively with TIMI grade 3 flow.  Overall normal LV function, mild mid anterior wall hypokinesis. EF 45-50%.  Recommendations:  Aspirin and Brilinta for 30 days then switch to aspirin and Plavix.  Discharge in a.m. if no problems.  High intensity  statin therapy.  Continue to follow for progression of ostial LAD disease at which time LIMA LAD should be a consideration although depending upon his age, may not be practical.      ASSESSMENT AND PLAN:  1.  Recent angina requiring cath and new stents to prox RCA and distal to previous stent.  He has residual distal   RCA of 70 %.  His LAD stent was patent and 70% ostial LAD stenosis that per Dr. Tamala Julian would need to be monitored and possible CABG.   Ostial LAD lesion would not be amenable to stenting FFR negative. He has right sided chest pain at rest and jaw not relieved with nitro that woiuld not appear to be angina will f/u exercise myovue to check ischemic burden in August Brilinta is not costing him anything and no side effects so will continue   -not on BB due to bradycardia - pt does not plan to go to cardiac rehab.   2.. HLD now on lipitor 80 due to 70% ostial LAD stenosis.  Will check lipids and hepatic in 4 weeks.  3.   HTN controlled    4. LV dysfunction monitor cath 10/25/16 EF 45-50% with mid anterior wall hypokinesis     Current medicines are reviewed with the patient today.  The patient Has no concerns regarding medicines.  The following changes have been made:  See above Labs/ tests ordered today include:Ex myovue August   Disposition:   FU:  see  above  Signed, Jenkins Rouge, MD  01/20/2017 8:30 AM    [facility], [place], [address] [place], [place] Phone: [phone]; Fax: [phone]

## 2017-01-17 DIAGNOSIS — Z961 Presence of intraocular lens: Secondary | ICD-10-CM | POA: Diagnosis not present

## 2017-01-17 DIAGNOSIS — D2212 Melanocytic nevi of left eyelid, including canthus: Secondary | ICD-10-CM | POA: Diagnosis not present

## 2017-01-17 DIAGNOSIS — H31091 Other chorioretinal scars, right eye: Secondary | ICD-10-CM | POA: Diagnosis not present

## 2017-01-17 DIAGNOSIS — H04123 Dry eye syndrome of bilateral lacrimal glands: Secondary | ICD-10-CM | POA: Diagnosis not present

## 2017-01-20 ENCOUNTER — Encounter: Payer: Self-pay | Admitting: Cardiovascular Disease

## 2017-01-20 ENCOUNTER — Ambulatory Visit (INDEPENDENT_AMBULATORY_CARE_PROVIDER_SITE_OTHER): Payer: BLUE CROSS/BLUE SHIELD | Admitting: Cardiovascular Disease

## 2017-01-20 VITALS — BP 130/60 | HR 66 | Ht 68.0 in | Wt 185.1 lb

## 2017-01-20 DIAGNOSIS — I200 Unstable angina: Secondary | ICD-10-CM

## 2017-01-20 DIAGNOSIS — I2510 Atherosclerotic heart disease of native coronary artery without angina pectoris: Secondary | ICD-10-CM | POA: Diagnosis not present

## 2017-01-20 NOTE — Patient Instructions (Signed)
Medication Instructions:  Your physician recommends that you continue on your current medications as directed. Please refer to the Current Medication list given to you today.  Labwork: NONE  Testing/Procedures: Your physician has requested that you have en exercise stress myoview in August. For further information please visit [email or web address]. Please follow instruction sheet, as given.  Follow-Up: Your physician wants you to follow-up in: August with Dr. [name].   If you need a refill on your cardiac medications before your next appointment, please call your pharmacy.

## 2017-01-22 ENCOUNTER — Other Ambulatory Visit: Payer: Self-pay | Admitting: *Deleted

## 2017-01-22 MED ORDER — LISINOPRIL 10 MG PO TABS: 10.0000 mg | ORAL_TABLET | Freq: Every day | ORAL | 0 refills | Status: DC

## 2017-01-22 MED ORDER — LISINOPRIL 10 MG PO TABS: 10.0000 mg | ORAL_TABLET | Freq: Every day | ORAL | 3 refills | Status: DC

## 2017-02-03 ENCOUNTER — Other Ambulatory Visit: Payer: Self-pay | Admitting: *Deleted

## 2017-02-03 DIAGNOSIS — I10 Essential (primary) hypertension: Secondary | ICD-10-CM | POA: Diagnosis not present

## 2017-02-03 DIAGNOSIS — K219 Gastro-esophageal reflux disease without esophagitis: Secondary | ICD-10-CM | POA: Diagnosis not present

## 2017-02-03 DIAGNOSIS — I25119 Atherosclerotic heart disease of native coronary artery with unspecified angina pectoris: Secondary | ICD-10-CM | POA: Diagnosis not present

## 2017-02-03 DIAGNOSIS — Z8546 Personal history of malignant neoplasm of prostate: Secondary | ICD-10-CM | POA: Diagnosis not present

## 2017-02-03 MED ORDER — TICAGRELOR 90 MG PO TABS: 90.0000 mg | ORAL_TABLET | Freq: Two times a day (BID) | ORAL | 3 refills | Status: DC

## 2017-02-03 NOTE — Telephone Encounter (Signed)
Patient called and stated that he had spoke to someone in the office last week and requested a refill on the brilinta and was told that it would be sent in. I do not see any documentation that he spoke with anyone in this office. I apologized and made the patient aware that I would send it in for him while we were on the phone. He was very Patent attorney.

## 2017-02-18 ENCOUNTER — Telehealth: Payer: Self-pay | Admitting: Cardiovascular Disease

## 2017-02-18 NOTE — Telephone Encounter (Signed)
Patient returning missed call, thanks.

## 2017-02-18 NOTE — Telephone Encounter (Signed)
New message     Pt has question on the dosage of Brillinta he is taking please call

## 2017-02-18 NOTE — Telephone Encounter (Signed)
Informed patient that he should be taking Brilinta 90 mg by mouth twice daily. Patient verbalized understanding.

## 2017-02-18 NOTE — Telephone Encounter (Signed)
Left message for patient to call back  

## 2017-02-21 ENCOUNTER — Ambulatory Visit: Payer: BLUE CROSS/BLUE SHIELD | Admitting: Cardiovascular Disease

## 2017-03-07 DIAGNOSIS — Z86010 Personal history of colonic polyps: Secondary | ICD-10-CM | POA: Diagnosis not present

## 2017-03-07 DIAGNOSIS — R079 Chest pain, unspecified: Secondary | ICD-10-CM | POA: Diagnosis not present

## 2017-04-23 ENCOUNTER — Telehealth (HOSPITAL_COMMUNITY): Payer: Self-pay | Admitting: *Deleted

## 2017-04-23 NOTE — Telephone Encounter (Signed)
Attempted to call patient regarding upcoming nuclear appointment- no answer, unable to leave message. Kurt Diaz

## 2017-04-24 ENCOUNTER — Telehealth (HOSPITAL_COMMUNITY): Payer: Self-pay | Admitting: *Deleted

## 2017-04-24 NOTE — Telephone Encounter (Signed)
Patient given detailed instructions per Myocardial Perfusion Study Information Sheet for the test on 04/28/17. Patient notified to arrive 15 minutes early and that it is imperative to arrive on time for appointment to keep from having the test rescheduled.  If you need to cancel or reschedule your appointment, please call the office within 24 hours of your appointment. . Patient verbalized understanding. Kirstie Peri

## 2017-04-28 ENCOUNTER — Ambulatory Visit (HOSPITAL_COMMUNITY): Payer: BLUE CROSS/BLUE SHIELD | Attending: Internal Medicine

## 2017-04-28 ENCOUNTER — Other Ambulatory Visit: Payer: Self-pay | Admitting: Physician Assistant

## 2017-04-28 DIAGNOSIS — R0602 Shortness of breath: Secondary | ICD-10-CM | POA: Insufficient documentation

## 2017-04-28 DIAGNOSIS — R9439 Abnormal result of other cardiovascular function study: Secondary | ICD-10-CM | POA: Insufficient documentation

## 2017-04-28 DIAGNOSIS — I2510 Atherosclerotic heart disease of native coronary artery without angina pectoris: Secondary | ICD-10-CM | POA: Diagnosis not present

## 2017-04-28 DIAGNOSIS — I779 Disorder of arteries and arterioles, unspecified: Secondary | ICD-10-CM | POA: Diagnosis not present

## 2017-04-28 DIAGNOSIS — R079 Chest pain, unspecified: Secondary | ICD-10-CM | POA: Diagnosis not present

## 2017-04-28 DIAGNOSIS — I10 Essential (primary) hypertension: Secondary | ICD-10-CM | POA: Insufficient documentation

## 2017-04-28 LAB — MYOCARDIAL PERFUSION IMAGING
Estimated workload: 10.1 METS
Exercise duration (min): 9 min
Exercise duration (sec): 0 s
LV dias vol: 79 mL (ref 62–150)
LV sys vol: 30 mL
MPHR: 145 {beats}/min
Peak HR: 134 {beats}/min
Percent HR: 92 %
RATE: 0.29
Rest HR: 52 {beats}/min
SDS: 0
SRS: 1
SSS: 1
TID: 0.95

## 2017-04-28 MED ORDER — TECHNETIUM TC 99M TETROFOSMIN IV KIT
10.8000 | PACK | Freq: Once | INTRAVENOUS | Status: AC | PRN
  Administered 2017-04-28: 10.8 via INTRAVENOUS
  Filled 2017-04-28: qty 11

## 2017-04-28 MED ORDER — TECHNETIUM TC 99M TETROFOSMIN IV KIT
32.7000 | PACK | Freq: Once | INTRAVENOUS | Status: AC | PRN
  Administered 2017-04-28: 32.7 via INTRAVENOUS
  Filled 2017-04-28: qty 33

## 2017-05-07 NOTE — Progress Notes (Deleted)
Cardiology Office Note   Date:  05/07/2017   ID:  Kurt Diaz, DOB 09-17-1941, MRN [id number]  PCP:  Antony Contras, MD  Cardiologist:  Dr. [name]    No chief complaint on file.     History of Present Illness: Kurt Diaz is a 76 y.o. male who presents for f/u of CAD   Hx with MI in 2006, when he had [place] the [place] airport and was found to be pulseless and underwent CPR and then underwent emergency cardiac catheterization and had intervention with 2 vessel disease being found. He had angioplasty and stenting to his LAD and angioplasty and stenting of his right coronary artery.   Cardiac cath was done 10/25/16 with ostial 70% LAD with FFR 0.87, patent stent to prox LAD.  Moderate ostial to proximal RCA and severe distal RCA disease. Widely patent mid RCA stent.  The right coronary was treated with distal (2.25 mm x 18) and proximal (2.75 x 15) Onyx Resolute drug-eluting stents with reduction in stenosis from 85% and 75% to 0% and 0% respectively with TIMI grade 3 flow.  Patent LCX.  EF 45-50%.    Plan was for ASA and Brilinta for 30 days then ASA and Plavix.  It was noted his LAD disease needed to be monitored.    Had SSCP getting on plane 12/23/16 Aborted flight Seen by Dr Harrington Challenger in ER and r/o with no ECG changes Started on protonix ? Reflux. Not helpful Still getting resting pains right chest and jaw that are not relieved With nitro   Reviewed his films from 10/25/16:  Small vessels Good result with distal RCA stent diffuse disease PLA LAO caudal view showd 70% ostial LAD lesion but FFR .87   04/28/17 low risk myovue  Study Highlights    Nuclear stress EF: 61%.  Blood pressure demonstrated a normal response to exercise.  There was no ST segment deviation noted during stress.  There is a very small in size, mild in intensity defect present in the apical lateral location that is reversible. This represents either diaphragmatic attenuation artifact or a very small area  of ischemia.  This is a low risk study.  The left ventricular ejection fraction is normal (55-65%).  Compared to study of 09/2009, there are no images to view but by report the defect appears unchanged.      Past Medical History:  Diagnosis Date  . Chest pain   . Coronary artery disease   . Coronary syndrome, acute ([place]) 2006  . Hyperlipidemia   . Syncope and collapse     Past Surgical History:  Procedure Laterality Date  . CARDIAC CATHETERIZATION  10/13/2009   EF 65%  . CARDIAC CATHETERIZATION N/A 10/25/2016   Procedure: Left Heart Cath and Coronary Angiography;  Surgeon: Belva Crome, MD;  Location: [facility];  Service: Cardiovascular;  Laterality: N/A;  . CARDIAC CATHETERIZATION N/A 10/25/2016   Procedure: Coronary Stent Intervention;  Surgeon: Belva Crome, MD;  Location: [facility];  Service: Cardiovascular;  Laterality: N/A;  . CARDIAC CATHETERIZATION N/A 10/25/2016   Procedure: Intravascular Pressure Wire/FFR Study;  Surgeon: Belva Crome, MD;  Location: [facility];  Service: Cardiovascular;  Laterality: N/A;  . CARDIOVASCULAR STRESS TEST  09/29/2009   EF 70%  . CORONARY ANGIOPLASTY     STENTING OF HIS LAD, STENTING OF HIS RIGHT CORONARY.  Marland Kitchen US ECHOCARDIOGRAPHY  12/15/2009   EF 55-60%     Current Outpatient  Prescriptions  Medication Sig Dispense Refill  . aspirin EC 81 MG tablet Take 81 mg by mouth daily after supper.    Marland Kitchen atorvastatin (LIPITOR) 80 MG tablet take 1 tablet by mouth once daily AT 6 PM 30 tablet 7  . ibuprofen (ADVIL,MOTRIN) 200 MG tablet Take 400 mg by mouth 2 (two) times daily as needed for headache or moderate pain.     Marland Kitchen lisinopril (PRINIVIL,ZESTRIL) 10 MG tablet Take 1 tablet (10 mg total) by mouth daily after supper. 90 tablet 3  . Multiple Vitamin (MULTIVITAMIN WITH MINERALS) TABS tablet Take 1 tablet by mouth daily after supper.     . nitroGLYCERIN (NITROSTAT) 0.4 MG SL tablet Place 1 tablet (0.4 mg total) under the tongue  every 5 (five) minutes as needed for chest pain. 25 tablet 3  . pantoprazole (PROTONIX) 40 MG tablet Take 1 tablet (40 mg total) by mouth daily. 90 tablet 1  . ticagrelor (BRILINTA) 90 MG TABS tablet Take 1 tablet (90 mg total) by mouth 2 (two) times daily. 180 tablet 3   No current facility-administered medications for this visit.     Allergies:   Patient has no known allergies.    Social History:  The patient  reports that he has never smoked. He has never used smokeless tobacco. He reports that he does not drink alcohol or use drugs.   Family History:  The patient's family history includes Heart attack in his mother; Lung cancer in his father; Stroke in his mother.     ROS:  General:no colds or fevers, no weight changes Skin:no rashes or ulcers HEENT:no blurred vision, no congestion CV:see HPI PUL:see HPI GI:no diarrhea constipation or melena, no indigestion GU:no hematuria, no dysuria MS:no joint pain, no claudication Neuro:no syncope, no lightheadedness Endo:no diabetes, no thyroid disease  Wt Readings from Last 3 Encounters:  04/28/17 185 lb (83.9 kg)  01/20/17 185 lb 1.9 oz (84 kg)  11/11/16 186 lb 12.8 oz (84.7 kg)     PHYSICAL EXAM: VS:  There were no vitals taken for this visit. , BMI There is no height or weight on file to calculate BMI. General:Pleasant affect, NAD Skin:Warm and dry, brisk capillary refill HEENT:normocephalic, sclera clear, mucus membranes moist Neck:supple, no JVD, no bruits  Heart:S1S2 RRR without murmur, gallup, rub or click Lungs:clear without rales, rhonchi, or wheezes FSE:LTRV, non tender, + BS, do not palpate liver spleen or masses Ext:no lower ext edema, 2+ pedal pulses, 2+ radial pulses Neuro:alert and oriented, MAE, follows commands, + facial symmetry    EKG:   .12/24/16 SR rate 68 normal ECG   Recent Labs: 12/16/2016: ALT 27 12/23/2016: BUN 20; Creatinine, Ser 1.22; Hemoglobin 14.0; Platelets 234; Potassium 4.3; Sodium 138     Lipid Panel    Component Value Date/Time   CHOL 99 (L) 12/16/2016 0730   TRIG 75 12/16/2016 0730   HDL 34 (L) 12/16/2016 0730   CHOLHDL 2.9 12/16/2016 0730   CHOLHDL 4 12/30/2014 0741   VLDL 20.6 12/30/2014 0741   LDLCALC 50 12/16/2016 0730       Other studies Reviewed: Additional studies/ records that were reviewed today include:  cardiac cath 10/25/16 Conclusion    Recurring angina at rest in this 76 year old gentleman with history of right and left coronary drug-eluting stents greater than 10 years ago.  Ostial 70% LAD with FFR identified to be 0.87.  Widely patent proximal LAD stent.  Moderate ostial to proximal RCA and severe distal RCA disease. Widely patent mid  RCA stent.  Widely patent/normal circumflex coronary artery.  The right coronary was treated with distal (2.25 mm x 18) and proximal (2.75 x 15) Onyx Resolute drug-eluting stents with reduction in stenosis from 85% and 75% to 0% and 0% respectively with TIMI grade 3 flow.  Overall normal LV function, mild mid anterior wall hypokinesis. EF 45-50%.  Recommendations:  Aspirin and Brilinta for 30 days then switch to aspirin and Plavix.  Discharge in a.m. if no problems.  High intensity statin therapy.  Continue to follow for progression of ostial LAD disease at which time LIMA LAD should be a consideration although depending upon his age, may not be practical.      ASSESSMENT AND PLAN:  1.  Recent angina requiring cath and new stents to prox RCA and distal to previous stent.  He has residual distal   RCA of 70 %.  His LAD stent was patent and 70% ostial LAD stenosis that per Dr. Tamala Julian would need to be monitored and possible CABG.   Ostial LAD lesion would not be amenable to stenting FFR negative.Low risk myovue 04/28/17 Continue medical Rx   -not on BB due to bradycardia - pt does not plan to go to cardiac rehab.   2.. HLD now on lipitor 80 due to 70% ostial LAD stenosis.  Will check lipids and  hepatic in 4 weeks.  3.   HTN controlled    4. LV dysfunction monitor cath 10/25/16 EF 45-50% with mid anterior wall hypokinesis     Disposition:   FU:  6 months   Signed, Jenkins Rouge, MD  05/07/2017 10:55 AM    [facility] [place], [place] [place], [place] Phone: [phone]; Fax: [phone]

## 2017-05-09 ENCOUNTER — Ambulatory Visit: Payer: BLUE CROSS/BLUE SHIELD | Admitting: Cardiovascular Disease

## 2017-05-09 ENCOUNTER — Telehealth: Payer: Self-pay | Admitting: Cardiovascular Disease

## 2017-05-09 NOTE — Telephone Encounter (Signed)
Patient returning your call.

## 2017-05-09 NOTE — Telephone Encounter (Signed)
lmtcb

## 2017-05-09 NOTE — Telephone Encounter (Signed)
New message      Pt was scheduled this am for a nuclear follow up.  He is in [place] and want to know if you can call him and give him the report?

## 2017-05-09 NOTE — Telephone Encounter (Signed)
Reviewed Myoview results with patient who verbalized understanding.  Pt wanted to inform our office he is going to be due for a prostate biopsy soon and asking about instructions for Brilinta/ASA. Will forward to Dr. [name] to address. (pt recently stented in February)

## 2017-05-11 NOTE — Telephone Encounter (Signed)
He just had 2 DES;s in February and should not stop his Brilinta if possible If he has to would not stop it for more than 3 days

## 2017-05-12 NOTE — Telephone Encounter (Signed)
LMTCB

## 2017-05-12 NOTE — Telephone Encounter (Signed)
Pt advised per Dr [name] he should not stop Brilinta if possible. Pt advised to ask surgeon doing biopsy to contact our office directly for surgical clearance request, any medication requests prior to surgery. Pt verbalized understanding.

## 2017-05-12 NOTE — Telephone Encounter (Signed)
Follow up    Pt is calling back for [facility]. He said he will be on the plane in the next hour.

## 2017-06-09 ENCOUNTER — Telehealth: Payer: Self-pay

## 2017-06-09 NOTE — Telephone Encounter (Signed)
Request for surgical clearance:  1. What type of surgery is being performed? TRUS/BX  2. When is this surgery scheduled? Not scheduled at this time, need clearance.  3. Are there any medications that need to be held prior to surgery and how long?Brilinta/ASA, five days   4. Name of physician performing surgery? Dr. Kathie Rhodes   5. What is your office phone and fax number? Contact Sonia at [phone] ext [phone], fax [phone]

## 2017-06-10 ENCOUNTER — Telehealth: Payer: Self-pay

## 2017-06-10 MED ORDER — TICAGRELOR 90 MG PO TABS: 90.0000 mg | ORAL_TABLET | Freq: Two times a day (BID) | ORAL | 0 refills | Status: DC

## 2017-06-10 MED ORDER — ATORVASTATIN CALCIUM 80 MG PO TABS: 80.0000 mg | ORAL_TABLET | Freq: Every day | ORAL | 0 refills | Status: DC

## 2017-06-10 MED ORDER — LISINOPRIL 10 MG PO TABS: 10.0000 mg | ORAL_TABLET | Freq: Every day | ORAL | 0 refills | Status: DC

## 2017-06-10 NOTE — Telephone Encounter (Signed)
Had new stents in February should not stop DAT until 10/2017 can have urologist call me about urgency of procedure Not sure what TRUS is ??

## 2017-06-10 NOTE — Telephone Encounter (Signed)
Patient is aware that his bx of his prostate will have to wait until after 10/2017.

## 2017-06-10 NOTE — Telephone Encounter (Signed)
Patient called back with pharmacy name and location. Sent in supply for 4 days for brilinta, lisinopril, and lipitor. Patient verbalized understanding and will call with any other questions or concerns.

## 2017-06-10 NOTE — Telephone Encounter (Signed)
Will fax to number provided.  

## 2017-06-10 NOTE — Telephone Encounter (Signed)
Patient is needing a few days supply of medications while he is out of town. Patient is in [place] and forgot his medications. Informed patient that he should at least get brilinta and lisinopril filled. Patient will call back with a pharmacy to send medications to.

## 2017-06-25 ENCOUNTER — Telehealth: Payer: Self-pay | Admitting: *Deleted

## 2017-06-25 NOTE — Telephone Encounter (Signed)
   [facility] Pre-operative Risk Assessment    Request for surgical clearance:  1. What type of surgery is being performed? Trus/Bx  2. When is this surgery scheduled? No date yet.   3. Are there any medications that need to be held prior to surgery and how long?None as of right now.  4. Name of physician performing surgery? Mark C. Karsten Ro, MD   5. What is your office phone and fax number? Phone number [phone] ext [phone] Fax number [phone]  6. Anesthesia type (None, local, MAC, general) None as of right now.   [place], [place] 06/25/2017, 11:51 AM  _________________________________________________________________   (provider comments below)

## 2017-06-25 NOTE — Telephone Encounter (Signed)
    Chart reviewed as part of pre-operative protocol coverage. I attempted to contact the patient 06/25/2017 in reference to pre-operative risk assessment for pending surgery as outlined below.    Per Dr Kyla Balzarine note 06/10/17 he should not stop DAPT until Feb 2019 secondary to DES placement feb 2018. I have called the patient and asked that he call back between 1:30 - 5 today or tomorrow. I will also forward conversation to Dr [name].   Kerin Ransom, PA-C 06/25/2017, 2:45 PM

## 2017-06-25 NOTE — Telephone Encounter (Signed)
If they can do biopsy on DAT that's fine

## 2017-06-26 NOTE — Telephone Encounter (Signed)
I spoke with the pt. He tells me he is not having any procedure till he can come off Brilinta in Feb 2019. He is not sure how this pre op request was generated.   Kerin Ransom PA-C 06/26/2017 1:57 PM

## 2017-06-26 NOTE — Telephone Encounter (Signed)
Discussed with Dr [name] as documented below. Do not stop antiplatelet therapy. Will forward to [place]. I left a message for the patient on his cell re Dr Kyla Balzarine recommendation.   Kerin Ransom PA-C 06/26/2017 7:58 AM

## 2017-07-09 ENCOUNTER — Other Ambulatory Visit: Payer: Self-pay | Admitting: *Deleted

## 2017-07-09 MED ORDER — TICAGRELOR 90 MG PO TABS: 90.0000 mg | ORAL_TABLET | Freq: Two times a day (BID) | ORAL | 0 refills | Status: DC

## 2017-08-11 DIAGNOSIS — K219 Gastro-esophageal reflux disease without esophagitis: Secondary | ICD-10-CM | POA: Diagnosis not present

## 2017-08-11 DIAGNOSIS — Z86010 Personal history of colonic polyps: Secondary | ICD-10-CM | POA: Diagnosis not present

## 2017-08-18 DIAGNOSIS — E782 Mixed hyperlipidemia: Secondary | ICD-10-CM | POA: Diagnosis not present

## 2017-10-21 ENCOUNTER — Ambulatory Visit (INDEPENDENT_AMBULATORY_CARE_PROVIDER_SITE_OTHER): Payer: BLUE CROSS/BLUE SHIELD | Admitting: Cardiovascular Disease

## 2017-10-21 ENCOUNTER — Encounter: Payer: Self-pay | Admitting: Cardiovascular Disease

## 2017-10-21 VITALS — BP 138/78 | HR 70 | Ht 68.0 in | Wt 192.0 lb

## 2017-10-21 DIAGNOSIS — I2510 Atherosclerotic heart disease of native coronary artery without angina pectoris: Secondary | ICD-10-CM | POA: Diagnosis not present

## 2017-10-21 DIAGNOSIS — I119 Hypertensive heart disease without heart failure: Secondary | ICD-10-CM

## 2017-10-21 DIAGNOSIS — E7800 Pure hypercholesterolemia, unspecified: Secondary | ICD-10-CM

## 2017-10-21 MED ORDER — NITROGLYCERIN 0.4 MG SL SUBL: 0.4000 mg | SUBLINGUAL_TABLET | SUBLINGUAL | 3 refills | Status: DC | PRN

## 2017-10-21 NOTE — Patient Instructions (Addendum)
Medication Instructions:  Your physician has recommended you make the following change in your medication:  1-You can stop taking Brilinta on October 26, 2017  Labwork: NONE  Testing/Procedures: NONE  Follow-Up: Your physician wants you to follow-up in: 6 months with Dr. [name]. You will receive a reminder letter in the mail two months in advance. If you don't receive a letter, please call our office to schedule the follow-up appointment.   If you need a refill on your cardiac medications before your next appointment, please call your pharmacy.

## 2017-10-21 NOTE — Progress Notes (Signed)
Cardiology Office Note   Date:  10/21/2017   ID:  KNOAH Diaz, DOB 13-Oct-1940, MRN [id number]  PCP:  Antony Contras, MD  Cardiologist:  Dr. [name]    No chief complaint on file.     History of Present Illness: Kurt Diaz is a 77 y.o. male who presents for f/u of CAD   Hx MI in 2006, when he [place] and was found to be pulseless and underwent CPR and then underwent emergency cardiac catheterization and had intervention with 2 vessel disease being found. He had angioplasty and stenting to his LAD and angioplasty and stenting of his right coronary artery.   Cardiac cath was done 10/25/16 with ostial 70% LAD with FFR 0.87, patent stent to prox LAD.  Moderate ostial to proximal RCA and severe distal RCA disease. Widely patent mid RCA stent.  The right coronary was treated with distal (2.25 mm x 18) and proximal (2.75 x 15) Onyx Resolute drug-eluting stents with reduction in stenosis from 85% and 75% to 0% and 0% respectively with TIMI grade 3 flow.  Patent LCX.  EF 45-50%.    Plan was for ASA and Brilinta for 30 days then ASA and Plavix.  It was noted his LAD disease needed to be monitored.    Had SSCP getting on plane 12/23/16 Aborted flight Seen by Dr Harrington Challenger in ER and r/o with no ECG changes Started on protonix ? Reflux. Not helpful Still getting resting pains right chest and jaw that are not relieved With nitro   Reviewed his films from 10/25/16:  Small vessels Good result with distal RCA stent diffuse disease PLA LAO caudal view showd 70% ostial LAD lesion but FFR .87   He is not having any chest pain Needs biopsy of prostate for chronically elevated PSA in 16 range Sees [facility]   Past Medical History:  Diagnosis Date  . Chest pain   . Coronary artery disease   . Coronary syndrome, acute ([place]) 2006  . Hyperlipidemia   . Syncope and collapse     Past Surgical History:  Procedure Laterality Date  . CARDIAC CATHETERIZATION  10/13/2009   EF 65%  .  CARDIAC CATHETERIZATION N/A 10/25/2016   Procedure: Left Heart Cath and Coronary Angiography;  Surgeon: Belva Crome, MD;  Location: [facility];  Service: Cardiovascular;  Laterality: N/A;  . CARDIAC CATHETERIZATION N/A 10/25/2016   Procedure: Coronary Stent Intervention;  Surgeon: Belva Crome, MD;  Location: [facility];  Service: Cardiovascular;  Laterality: N/A;  . CARDIAC CATHETERIZATION N/A 10/25/2016   Procedure: Intravascular Pressure Wire/FFR Study;  Surgeon: Belva Crome, MD;  Location: [facility];  Service: Cardiovascular;  Laterality: N/A;  . CARDIOVASCULAR STRESS TEST  09/29/2009   EF 70%  . CORONARY ANGIOPLASTY     STENTING OF HIS LAD, STENTING OF HIS RIGHT CORONARY.  Marland Kitchen US ECHOCARDIOGRAPHY  12/15/2009   EF 55-60%     Current Outpatient Medications  Medication Sig Dispense Refill  . aspirin EC 81 MG tablet Take 81 mg by mouth daily after supper.    Marland Kitchen atorvastatin (LIPITOR) 80 MG tablet Take 1 tablet (80 mg total) by mouth daily at 6 PM. 4 tablet 0  . esomeprazole (NEXIUM) 20 MG capsule Take 20 mg by mouth daily at 12 noon.    Marland Kitchen ibuprofen (ADVIL,MOTRIN) 200 MG tablet Take 400 mg by mouth 2 (two) times daily as needed for headache or moderate pain.     Marland Kitchen  lisinopril (PRINIVIL,ZESTRIL) 10 MG tablet Take 1 tablet (10 mg total) by mouth daily after supper. 5 tablet 0  . Multiple Vitamin (MULTIVITAMIN WITH MINERALS) TABS tablet Take 1 tablet by mouth daily after supper.     . nitroGLYCERIN (NITROSTAT) 0.4 MG SL tablet Place 1 tablet (0.4 mg total) under the tongue every 5 (five) minutes as needed for chest pain. 25 tablet 3  . ticagrelor (BRILINTA) 90 MG TABS tablet Take 1 tablet (90 mg total) by mouth 2 (two) times daily. 16 tablet 0   No current facility-administered medications for this visit.     Allergies:   Patient has no known allergies.    Social History:  The patient  reports that  has never smoked. he has never used smokeless tobacco. He reports that  he does not drink alcohol or use drugs.   Family History:  The patient's family history includes Heart attack in his mother; Lung cancer in his father; Stroke in his mother.     ROS:  General:no colds or fevers, no weight changes Skin:no rashes or ulcers HEENT:no blurred vision, no congestion CV:see HPI PUL:see HPI GI:no diarrhea constipation or melena, no indigestion GU:no hematuria, no dysuria MS:no joint pain, no claudication Neuro:no syncope, no lightheadedness Endo:no diabetes, no thyroid disease  Wt Readings from Last 3 Encounters:  10/21/17 192 lb (87.1 kg)  04/28/17 185 lb (83.9 kg)  01/20/17 185 lb 1.9 oz (84 kg)     PHYSICAL EXAM: VS:  BP 138/78   Pulse 70   Ht 5\' 8"  (1.727 m)   Wt 192 lb (87.1 kg)   BMI 29.19 kg/m  , BMI Body mass index is 29.19 kg/m. Affect appropriate Healthy:  appears stated age 9: normal Neck supple with no adenopathy JVP normal no bruits no thyromegaly Lungs clear with no wheezing and good diaphragmatic motion Heart:  S1/S2 no murmur, no rub, gallop or click PMI normal Abdomen: benighn, BS positve, no tenderness, no AAA no bruit.  No HSM or HJR Distal pulses intact with no bruits No edema Neuro non-focal Skin warm and dry No muscular weakness     EKG:   .12/24/16 SR rate 68 normal ECG   Recent Labs: 12/16/2016: ALT 27 12/23/2016: BUN 20; Creatinine, Ser 1.22; Hemoglobin 14.0; Platelets 234; Potassium 4.3; Sodium 138    Lipid Panel    Component Value Date/Time   CHOL 99 (L) 12/16/2016 0730   TRIG 75 12/16/2016 0730   HDL 34 (L) 12/16/2016 0730   CHOLHDL 2.9 12/16/2016 0730   CHOLHDL 4 12/30/2014 0741   VLDL 20.6 12/30/2014 0741   LDLCALC 50 12/16/2016 0730       Other studies Reviewed: Additional studies/ records that were reviewed today include:  cardiac cath 10/25/16 Conclusion    Recurring angina at rest in this 77 year old gentleman with history of right and left coronary drug-eluting stents greater than 10  years ago.  Ostial 70% LAD with FFR identified to be 0.87.  Widely patent proximal LAD stent.  Moderate ostial to proximal RCA and severe distal RCA disease. Widely patent mid RCA stent.  Widely patent/normal circumflex coronary artery.  The right coronary was treated with distal (2.25 mm x 18) and proximal (2.75 x 15) Onyx Resolute drug-eluting stents with reduction in stenosis from 85% and 75% to 0% and 0% respectively with TIMI grade 3 flow.  Overall normal LV function, mild mid anterior wall hypokinesis. EF 45-50%.  Recommendations:  Aspirin and Brilinta for 30 days then switch to  aspirin and Plavix.  Discharge in a.m. if no problems.  High intensity statin therapy.  Continue to follow for progression of ostial LAD disease at which time LIMA LAD should be a consideration although depending upon his age, may not be practical.      ASSESSMENT AND PLAN:  1.  CAD stent to RCA x 2 and LAD last stent 10/25/16 will stop DAT next month History of bradycardia So not on beta blocker no angina new nitro called in   2.. HLD now on lipitor 80 due to 70% ostial LAD stenosis.  Will check lipids and hepatic in 4 weeks.  3.   HTN Well controlled.  Continue current medications and low sodium Dash type diet.    4. LV dysfunction monitor cath 10/25/16 EF 45-50% with mid anterior wall hypokinesis   F/U 6 months   Jenkins Rouge

## 2017-10-24 ENCOUNTER — Ambulatory Visit: Payer: BLUE CROSS/BLUE SHIELD | Admitting: Cardiovascular Disease

## 2017-11-17 DIAGNOSIS — C61 Malignant neoplasm of prostate: Secondary | ICD-10-CM | POA: Diagnosis not present

## 2017-12-01 DIAGNOSIS — N400 Enlarged prostate without lower urinary tract symptoms: Secondary | ICD-10-CM | POA: Diagnosis not present

## 2017-12-01 DIAGNOSIS — C61 Malignant neoplasm of prostate: Secondary | ICD-10-CM | POA: Diagnosis not present

## 2017-12-17 DIAGNOSIS — J209 Acute bronchitis, unspecified: Secondary | ICD-10-CM | POA: Diagnosis not present

## 2018-01-13 ENCOUNTER — Other Ambulatory Visit: Payer: Self-pay | Admitting: Cardiovascular Disease

## 2018-01-26 DIAGNOSIS — C61 Malignant neoplasm of prostate: Secondary | ICD-10-CM | POA: Diagnosis not present

## 2018-02-04 DIAGNOSIS — C61 Malignant neoplasm of prostate: Secondary | ICD-10-CM | POA: Diagnosis not present

## 2018-02-09 DIAGNOSIS — R079 Chest pain, unspecified: Secondary | ICD-10-CM | POA: Diagnosis not present

## 2018-02-09 DIAGNOSIS — K219 Gastro-esophageal reflux disease without esophagitis: Secondary | ICD-10-CM | POA: Diagnosis not present

## 2018-02-09 DIAGNOSIS — Z86010 Personal history of colonic polyps: Secondary | ICD-10-CM | POA: Diagnosis not present

## 2018-02-11 ENCOUNTER — Other Ambulatory Visit: Payer: Self-pay | Admitting: Cardiovascular Disease

## 2018-02-20 DIAGNOSIS — K219 Gastro-esophageal reflux disease without esophagitis: Secondary | ICD-10-CM | POA: Diagnosis not present

## 2018-02-20 DIAGNOSIS — E782 Mixed hyperlipidemia: Secondary | ICD-10-CM | POA: Diagnosis not present

## 2018-02-20 DIAGNOSIS — I25119 Atherosclerotic heart disease of native coronary artery with unspecified angina pectoris: Secondary | ICD-10-CM | POA: Diagnosis not present

## 2018-02-20 DIAGNOSIS — I10 Essential (primary) hypertension: Secondary | ICD-10-CM | POA: Diagnosis not present

## 2018-02-20 DIAGNOSIS — C61 Malignant neoplasm of prostate: Secondary | ICD-10-CM | POA: Diagnosis not present

## 2018-02-20 DIAGNOSIS — J309 Allergic rhinitis, unspecified: Secondary | ICD-10-CM | POA: Diagnosis not present

## 2018-03-19 ENCOUNTER — Encounter: Payer: Self-pay | Admitting: Cardiovascular Disease

## 2018-03-25 NOTE — Progress Notes (Signed)
Cardiology Office Note   Date:  03/27/2018   ID:  Kurt Diaz, DOB 1941/05/10, MRN [id number]  PCP:  Kurt Contras, MD  Cardiologist:  Dr. Johnsie Diaz    No chief complaint on file.     History of Present Illness: Kurt Diaz is a 77 y.o. male who presents for f/u of CAD History of MI in 13-Jan-2005 sudden death stenting of LAD and RCA.    Cardiac cath was done 10/25/16 with ostial 70% LAD with FFR 0.87, patent stent to prox LAD.  Moderate ostial to proximal RCA and severe distal RCA disease. Widely patent mid RCA stent.  The right coronary was treated with distal (2.25 mm x 18) and proximal (2.75 x 15) Onyx Resolute drug-eluting stents with reduction in stenosis from 85% and 75% to 0% and 0% respectively with TIMI grade 3 flow.  Patent LCX.  EF 45-50%.    Plan was for ASA and Brilinta for 30 days then ASA and Plavix.  It was noted his LAD disease needed to be monitored.    Had SSC[facility] getting on plane 12/23/16 Aborted flight Seen by Dr Harrington Challenger in ER and r/o with no ECG changes Started on protonix ? Reflux. Not helpful Still getting resting pains right chest and jaw that are not relieved With nitro   Reviewed his films from 10/25/16:  Small vessels Good result with distal RCA stent diffuse disease PLA LAO caudal view showd 70% ostial LAD lesion but FFR .87   Biopsy of prostate this time ok  elevated PSA in 16 range Sees Otelin   Having atypical resting right sided chest pain Saw GI Kurt Diaz and given Nexium with some benefit. Pain is not exertional but does radiate to jaw    Past Medical History:  Diagnosis Date  . Chest pain   . Coronary artery disease   . Coronary syndrome, acute ([place]) 2005/01/13  . Hyperlipidemia   . Syncope and collapse     Past Surgical History:  Procedure Laterality Date  . CARDIAC CATHETERIZATION  10/13/2009   EF 65%  . CARDIAC CATHETERIZATION N/A 10/25/2016   Procedure: Left Heart Cath and Coronary Angiography;  Surgeon: Belva Crome, MD;  Location: [facility];  Service: Cardiovascular;  Laterality: N/A;  . CARDIAC CATHETERIZATION N/A 10/25/2016   Procedure: Coronary Stent Intervention;  Surgeon: Belva Crome, MD;  Location: [facility];  Service: Cardiovascular;  Laterality: N/A;  . CARDIAC CATHETERIZATION N/A 10/25/2016   Procedure: Intravascular Pressure Wire/FFR Study;  Surgeon: Belva Crome, MD;  Location: [facility];  Service: Cardiovascular;  Laterality: N/A;  . CARDIOVASCULAR STRESS TEST  09/29/2009   EF 70%  . CORONARY ANGIOPLASTY     STENTING OF HIS LAD, STENTING OF HIS RIGHT CORONARY.  Marland Kitchen US ECHOCARDIOGRAPHY  12/15/2009   EF 55-60%     Current Outpatient Medications  Medication Sig Dispense Refill  . aspirin EC 81 MG tablet Take 81 mg by mouth daily after supper.    Marland Kitchen atorvastatin (LIPITOR) 80 MG tablet Take 1 tablet (80 mg total) by mouth daily at 6 PM. 30 tablet 9  . esomeprazole (NEXIUM) 20 MG capsule Take 20 mg by mouth daily at 12 noon.    Marland Kitchen ibuprofen (ADVIL,MOTRIN) 200 MG tablet Take 400 mg by mouth 2 (two) times daily as needed for headache or moderate pain.     Marland Kitchen lisinopril (PRINIVIL,ZESTRIL) 20 MG tablet Take 20 mg by mouth daily.    Marland Kitchen  Multiple Vitamin (MULTIVITAMIN WITH MINERALS) TABS tablet Take 1 tablet by mouth daily after supper.     . nitroGLYCERIN (NITROSTAT) 0.4 MG SL tablet Place 1 tablet (0.4 mg total) under the tongue every 5 (five) minutes as needed for chest pain. 25 tablet 3   No current facility-administered medications for this visit.     Allergies:   Patient has no known allergies.    Social History:  The patient  reports that he has never smoked. He has never used smokeless tobacco. He reports that he does not drink alcohol or use drugs.   Family History:  The patient's family history includes Heart attack in his mother; Lung cancer in his father; Stroke in his mother.     ROS:  General:no colds or fevers, no weight changes Skin:no rashes or ulcers HEENT:no blurred vision, no  congestion CV:see HPI PUL:see HPI GI:no diarrhea constipation or melena, no indigestion GU:no hematuria, no dysuria MS:no joint pain, no claudication Neuro:no syncope, no lightheadedness Endo:no diabetes, no thyroid disease  Wt Readings from Last 3 Encounters:  03/27/18 190 lb 4 oz (86.3 kg)  10/21/17 192 lb (87.1 kg)  04/28/17 185 lb (83.9 kg)     PHYSICAL EXAM: VS:  BP 128/72   Pulse 73   Ht 5\' 8"  (1.727 m)   Wt 190 lb 4 oz (86.3 kg)   SpO2 98%   BMI 28.93 kg/m  , BMI Body mass index is 28.93 kg/m.   Affect appropriate Healthy:  appears stated age 63: normal Neck supple with no adenopathy JVP normal no bruits no thyromegaly Lungs clear with no wheezing and good diaphragmatic motion Heart:  S1/S2 no murmur, no rub, gallop or click PMI normal Abdomen: benighn, BS positve, no tenderness, no AAA no bruit.  No HSM or HJR Distal pulses intact with no bruits No edema Neuro non-focal Skin warm and dry No muscular weakness    EKG:   .12/24/16 SR rate 68 normal ECG 03/27/18 SR rate 54 normal   Recent Labs: No results found for requested labs within last 8760 hours.    Lipid Panel    Component Value Date/Time   CHOL 99 (L) 12/16/2016 0730   TRIG 75 12/16/2016 0730   HDL 34 (L) 12/16/2016 0730   CHOLHDL 2.9 12/16/2016 0730   CHOLHDL 4 12/30/2014 0741   VLDL 20.6 12/30/2014 0741   LDLCALC 50 12/16/2016 0730       Other studies Reviewed: Additional studies/ records that were reviewed today include:  cardiac cath 10/25/16 Conclusion    Recurring angina at rest in this 77 year old gentleman with history of right and left coronary drug-eluting stents greater than 10 years ago.  Ostial 70% LAD with FFR identified to be 0.87.  Widely patent proximal LAD stent.  Moderate ostial to proximal RCA and severe distal RCA disease. Widely patent mid RCA stent.  Widely patent/normal circumflex coronary artery.  The right coronary was treated with distal (2.25 mm x 18)  and proximal (2.75 x 15) Onyx Resolute drug-eluting stents with reduction in stenosis from 85% and 75% to 0% and 0% respectively with TIMI grade 3 flow.  Overall normal LV function, mild mid anterior wall hypokinesis. EF 45-50%.  Recommendations:  Aspirin and Brilinta for 30 days then switch to aspirin and Plavix.  Discharge in a.m. if no problems.  High intensity statin therapy.  Continue to follow for progression of ostial LAD disease at which time LIMA LAD should be a consideration although depending upon his age,  may not be practical.      ASSESSMENT AND PLAN:  1.  CAD stent to RCA x 2 and LAD last stent 10/25/16  No longer on DAT  History of bradycardia So not on beta blocker Atypical pains f/u ETT   2.. HLD now on lipitor 80  LDL 79 continue current Rx   3.   HTN Well controlled.  Continue current medications and low sodium Dash type diet.   Lisinopril recently increased to 20 gm daily by Dr Moreen Fowler   4. LV dysfunction monitor cath 10/25/16 EF 45-50% with mid anterior wall hypokinesis on cath F/u myovue EF 61%   F/U 6 months   Jenkins Rouge

## 2018-03-27 ENCOUNTER — Ambulatory Visit (INDEPENDENT_AMBULATORY_CARE_PROVIDER_SITE_OTHER): Payer: BLUE CROSS/BLUE SHIELD | Admitting: Cardiovascular Disease

## 2018-03-27 ENCOUNTER — Encounter: Payer: Self-pay | Admitting: Cardiovascular Disease

## 2018-03-27 VITALS — BP 128/72 | HR 73 | Ht 68.0 in | Wt 190.2 lb

## 2018-03-27 DIAGNOSIS — E7800 Pure hypercholesterolemia, unspecified: Secondary | ICD-10-CM

## 2018-03-27 DIAGNOSIS — I25110 Atherosclerotic heart disease of native coronary artery with unstable angina pectoris: Secondary | ICD-10-CM

## 2018-03-27 DIAGNOSIS — I119 Hypertensive heart disease without heart failure: Secondary | ICD-10-CM | POA: Diagnosis not present

## 2018-03-27 NOTE — Patient Instructions (Addendum)
Medication Instructions:  Your physician recommends that you continue on your current medications as directed. Please refer to the Current Medication list given to you today.  Labwork: NONE  Testing/Procedures: Your physician has requested that you have an exercise tolerance test. For further information please visit [email or web address]. Please also follow instruction sheet, as given.  Follow-Up: Your physician wants you to follow-up in: 12 months with Dr. [name]. You will receive a reminder letter in the mail two months in advance. If you don't receive a letter, please call our office to schedule the follow-up appointment.   If you need a refill on your cardiac medications before your next appointment, please call your pharmacy.

## 2018-04-06 ENCOUNTER — Ambulatory Visit (INDEPENDENT_AMBULATORY_CARE_PROVIDER_SITE_OTHER): Payer: BLUE CROSS/BLUE SHIELD

## 2018-04-06 ENCOUNTER — Other Ambulatory Visit: Payer: Self-pay | Admitting: Cardiovascular Disease

## 2018-04-06 ENCOUNTER — Encounter: Payer: Self-pay | Admitting: *Deleted

## 2018-04-06 DIAGNOSIS — I25110 Atherosclerotic heart disease of native coronary artery with unstable angina pectoris: Secondary | ICD-10-CM | POA: Diagnosis not present

## 2018-04-06 DIAGNOSIS — R079 Chest pain, unspecified: Secondary | ICD-10-CM

## 2018-04-06 LAB — EXERCISE TOLERANCE TEST
Estimated workload: 10.1 METS
Exercise duration (min): 8 min
Exercise duration (sec): 0 s
MPHR: 144 {beats}/min
Peak HR: 137 {beats}/min
Percent HR: 95 %
RPE: 17
Rest HR: 57 {beats}/min

## 2018-04-07 ENCOUNTER — Ambulatory Visit (HOSPITAL_COMMUNITY)
Admission: RE | Admit: 2018-04-07 | Discharge: 2018-04-07 | Disposition: A | Discharge status: Home / Self Care | Payer: BLUE CROSS/BLUE SHIELD | Source: Ambulatory Visit | Attending: Cardiovascular Disease | Admitting: Cardiovascular Disease

## 2018-04-07 DIAGNOSIS — I25110 Atherosclerotic heart disease of native coronary artery with unstable angina pectoris: Secondary | ICD-10-CM | POA: Diagnosis not present

## 2018-04-07 DIAGNOSIS — R079 Chest pain, unspecified: Secondary | ICD-10-CM | POA: Diagnosis not present

## 2018-04-07 LAB — MYOCARDIAL PERFUSION IMAGING
Estimated workload: 11.2 METS
Exercise duration (min): 9 min
Exercise duration (sec): 40 s
LV dias vol: 86 mL (ref 62–150)
LV sys vol: 32 mL
MPHR: 144 {beats}/min
Peak HR: 142 {beats}/min
Percent HR: 98 %
RPE: 19
Rest HR: 53 {beats}/min
SDS: 0
SRS: 1
SSS: 1
TID: 0.99

## 2018-04-07 MED ORDER — TECHNETIUM TC 99M TETROFOSMIN IV KIT
9.8000 | PACK | Freq: Once | INTRAVENOUS | Status: AC | PRN
  Administered 2018-04-07: 9.8 via INTRAVENOUS
  Filled 2018-04-07: qty 10

## 2018-04-07 MED ORDER — TECHNETIUM TC 99M TETROFOSMIN IV KIT
32.4000 | PACK | Freq: Once | INTRAVENOUS | Status: AC | PRN
  Administered 2018-04-07: 32.4 via INTRAVENOUS
  Filled 2018-04-07: qty 33

## 2018-04-28 ENCOUNTER — Telehealth: Payer: Self-pay | Admitting: Cardiovascular Disease

## 2018-04-28 NOTE — Telephone Encounter (Signed)
New Message   Pt c/o BP issue:  1. What are your last 5 BP readings? 165/80, 170/82 2. Are you having any other symptoms (ex. Dizziness, headache, blurred vision, passed out)? No  3. What is your medication issue? No issue    Patient states his BP has been running high. Please call to discuss.

## 2018-04-28 NOTE — Telephone Encounter (Signed)
Called patient back about message. Patient complaining of elevated BP since his stress test, which was two or more weeks ago. Patient reported SBP running between 155 to 170. Patient stated he has a chest ache here and there, but nothing consistent. Patient denies any other symptoms. Patient's lisinopril was increased to 20 mg at last office visit. Will send message to Dr. [name] for advisement.

## 2018-04-28 NOTE — Telephone Encounter (Signed)
If bp running high can take zestril 20 bid

## 2018-04-29 MED ORDER — LISINOPRIL 20 MG PO TABS: 20.0000 mg | ORAL_TABLET | Freq: Two times a day (BID) | ORAL | 11 refills | Status: DC

## 2018-04-29 NOTE — Telephone Encounter (Signed)
Called patient back. Informed him of Dr. Kyla Balzarine recommendations. Patient will try taking zestril 20 mg by mouth twice daily to see if his BP improves.

## 2018-04-29 NOTE — Telephone Encounter (Signed)
Left message for patient to call back  

## 2018-04-29 NOTE — Telephone Encounter (Signed)
F/u ° ° °Pt returning your call. Please call pt. °

## 2018-05-01 ENCOUNTER — Ambulatory Visit: Payer: BLUE CROSS/BLUE SHIELD | Admitting: Cardiovascular Disease

## 2018-05-25 ENCOUNTER — Encounter (HOSPITAL_COMMUNITY): Payer: Self-pay

## 2018-05-25 ENCOUNTER — Emergency Department (HOSPITAL_COMMUNITY)
Admission: EM | Admit: 2018-05-25 | Discharge: 2018-05-25 | Disposition: A | Discharge status: Home / Self Care | Payer: BLUE CROSS/BLUE SHIELD | Attending: Emergency Medicine | Admitting: Emergency Medicine

## 2018-05-25 ENCOUNTER — Other Ambulatory Visit: Payer: Self-pay

## 2018-05-25 ENCOUNTER — Emergency Department (HOSPITAL_COMMUNITY): Payer: BLUE CROSS/BLUE SHIELD

## 2018-05-25 DIAGNOSIS — Z8546 Personal history of malignant neoplasm of prostate: Secondary | ICD-10-CM | POA: Diagnosis not present

## 2018-05-25 DIAGNOSIS — I10 Essential (primary) hypertension: Secondary | ICD-10-CM | POA: Insufficient documentation

## 2018-05-25 DIAGNOSIS — R0789 Other chest pain: Secondary | ICD-10-CM | POA: Insufficient documentation

## 2018-05-25 DIAGNOSIS — Z79899 Other long term (current) drug therapy: Secondary | ICD-10-CM | POA: Insufficient documentation

## 2018-05-25 DIAGNOSIS — I2510 Atherosclerotic heart disease of native coronary artery without angina pectoris: Secondary | ICD-10-CM | POA: Insufficient documentation

## 2018-05-25 DIAGNOSIS — R079 Chest pain, unspecified: Secondary | ICD-10-CM | POA: Diagnosis not present

## 2018-05-25 DIAGNOSIS — Z7982 Long term (current) use of aspirin: Secondary | ICD-10-CM | POA: Diagnosis not present

## 2018-05-25 HISTORY — DX: Essential (primary) hypertension: I10

## 2018-05-25 LAB — BASIC METABOLIC PANEL
Anion gap: 8 (ref 5–15)
BUN: 16 mg/dL (ref 8–23)
CO2: 25 mmol/L (ref 22–32)
Calcium: 8.9 mg/dL (ref 8.9–10.3)
Chloride: 104 mmol/L (ref 98–111)
Creatinine, Ser: 0.93 mg/dL (ref 0.61–1.24)
GFR calc Af Amer: >60 mL/min (ref >60)
GFR calc non Af Amer: >60 mL/min (ref >60)
Glucose, Bld: 104 mg/dL — ABNORMAL HIGH (ref 70–99)
Potassium: 4.3 mmol/L (ref 3.5–5.1)
Sodium: 137 mmol/L (ref 135–145)

## 2018-05-25 LAB — I-STAT TROPONIN, ED
Troponin i, poc: 0.01 ng/mL (ref 0.00–0.08)
Troponin i, poc: 0.02 ng/mL (ref 0.00–0.08)
Troponin i, poc: 0 ng/mL (ref 0.00–0.08)

## 2018-05-25 LAB — CBC
HCT: 46.9 % (ref 39.0–52.0)
Hemoglobin: 15.1 g/dL (ref 13.0–17.0)
MCH: 29.7 pg (ref 26.0–34.0)
MCHC: 32.2 g/dL (ref 30.0–36.0)
MCV: 92.1 fL (ref 78.0–100.0)
Platelets: 249 10*3/uL (ref 150–400)
RBC: 5.09 MIL/uL (ref 4.22–5.81)
RDW: 13.4 % (ref 11.5–15.5)
WBC: 6.9 10*3/uL (ref 4.0–10.5)

## 2018-05-25 MED ORDER — SUCRALFATE 1 G PO TABS: 1.0000 g | ORAL_TABLET | Freq: Four times a day (QID) | ORAL | 0 refills | Status: DC

## 2018-05-25 MED ORDER — ASPIRIN 81 MG PO CHEW
324.0000 mg | CHEWABLE_TABLET | Freq: Once | ORAL | Status: AC
  Administered 2018-05-25: 324 mg via ORAL
  Filled 2018-05-25: qty 4

## 2018-05-25 MED ORDER — NITROGLYCERIN 2 % TD OINT
1.0000 [in_us] | TOPICAL_OINTMENT | Freq: Four times a day (QID) | TRANSDERMAL | Status: DC
  Administered 2018-05-25: 1 [in_us] via TOPICAL
  Filled 2018-05-25: qty 1

## 2018-05-25 NOTE — ED Notes (Signed)
ED Provider at bedside. 

## 2018-05-25 NOTE — ED Provider Notes (Signed)
[facility] Provider Note   CSN: [id number] Arrival date & time: 05/25/18  1052     History   Chief Complaint Chief Complaint  Patient presents with  . Chest Pain    HPI Kurt Diaz is a 77 y.o. male.  HPI Pt had chest pain last evening that last for a few hours before going to bed.  It was one spot on the left side, moderate in nature, constant.  He tried some ntg but it did not help.  It mostly resolved and he went to bed.  This am it was there again.  The pain is constant on the left side.  Nothing seems to make it worse or better, such as deep breaething, activity, movements.  Pt has CAD. He had a stress test a few months ago for evaluation of recurrent chest pain but it was on the right side.  Last stent was feb 2018. Past Medical History:  Diagnosis Date  . Chest pain   . Coronary artery disease   . Coronary syndrome, acute ([place]) 2006  . Hyperlipidemia   . Hypertension   . Syncope and collapse     Patient Active Problem List   Diagnosis Date Noted  . Coronary artery disease 10/25/2016  . Coronary artery disease involving native coronary artery of native heart with angina pectoris ([place])   . Prostate cancer ([place]) 12/18/2012  . BPH (benign prostatic hyperplasia) 12/18/2012  . Benign hypertensive heart disease without heart failure 12/18/2012  . Ischemic heart disease 05/03/2011  . Hypercholesterolemia 05/03/2011    Past Surgical History:  Procedure Laterality Date  . CARDIAC CATHETERIZATION  10/13/2009   EF 65%  . CARDIAC CATHETERIZATION N/A 10/25/2016   Procedure: Left Heart Cath and Coronary Angiography;  Surgeon: Belva Crome, MD;  Location: [facility];  Service: Cardiovascular;  Laterality: N/A;  . CARDIAC CATHETERIZATION N/A 10/25/2016   Procedure: Coronary Stent Intervention;  Surgeon: Belva Crome, MD;  Location: [facility];  Service: Cardiovascular;  Laterality: N/A;  . CARDIAC CATHETERIZATION N/A  10/25/2016   Procedure: Intravascular Pressure Wire/FFR Study;  Surgeon: Belva Crome, MD;  Location: [facility];  Service: Cardiovascular;  Laterality: N/A;  . CARDIOVASCULAR STRESS TEST  09/29/2009   EF 70%  . CORONARY ANGIOPLASTY     STENTING OF HIS LAD, STENTING OF HIS RIGHT CORONARY.  Marland Kitchen US ECHOCARDIOGRAPHY  12/15/2009   EF 55-60%        Home Medications    Prior to Admission medications   Medication Sig Start Date End Date Taking? Authorizing Provider  aspirin EC 81 MG tablet Take 81 mg by mouth daily after supper.   Yes [provider]  atorvastatin (LIPITOR) 80 MG tablet Take 1 tablet (80 mg total) by mouth daily at 6 PM. 01/13/18  Yes Josue Hector, MD  ibuprofen (ADVIL,MOTRIN) 200 MG tablet Take 400 mg by mouth 2 (two) times daily as needed for headache or moderate pain.    Yes [provider]  lisinopril (PRINIVIL,ZESTRIL) 20 MG tablet Take 1 tablet (20 mg total) by mouth 2 (two) times daily. Patient taking differently: Take 40 mg by mouth daily.  04/29/18  Yes Josue Hector, MD  Multiple Vitamin (MULTIVITAMIN WITH MINERALS) TABS tablet Take 1 tablet by mouth daily after supper.    Yes [provider]  nitroGLYCERIN (NITROSTAT) 0.4 MG SL tablet Place 1 tablet (0.4 mg total) under the tongue every 5 (five)  minutes as needed for chest pain. 10/21/17  Yes Josue Hector, MD  sucralfate (CARAFATE) 1 g tablet Take 1 tablet (1 g total) by mouth 4 (four) times daily. 05/25/18   Dorie Rank, MD    Family History Family History  Problem Relation Age of Onset  . Heart attack Mother   . Stroke Mother   . Lung cancer Father     Social History Social History   Tobacco Use  . Smoking status: Never Smoker  . Smokeless tobacco: Never Used  Substance Use Topics  . Alcohol use: No  . Drug use: No     Allergies   Patient has no known allergies.   Review of Systems Review of Systems  Constitutional: Negative for diaphoresis and fever.    Respiratory: Negative for shortness of breath.   Gastrointestinal: Negative for nausea.  All other systems reviewed and are negative.    Physical Exam Updated Vital Signs BP (!) 126/59   Pulse 64   Temp 99.1 F (37.3 C) (Oral)   Resp (!) 21   SpO2 94%   Physical Exam  Constitutional: He appears well-developed and well-nourished. No distress.  HENT:  Head: Normocephalic and atraumatic.  Right Ear: External ear normal.  Left Ear: External ear normal.  Eyes: Conjunctivae are normal. Right eye exhibits no discharge. Left eye exhibits no discharge. No scleral icterus.  Neck: Neck supple. No tracheal deviation present.  Cardiovascular: Normal rate, regular rhythm and intact distal pulses.  Pulmonary/Chest: Effort normal and breath sounds normal. No stridor. No respiratory distress. He has no wheezes. He has no rales.  Abdominal: Soft. Bowel sounds are normal. He exhibits no distension. There is no tenderness. There is no rebound and no guarding.  Musculoskeletal: He exhibits no edema or tenderness.  Neurological: He is alert. He has normal strength. No cranial nerve deficit (no facial droop, extraocular movements intact, no slurred speech) or sensory deficit. He exhibits normal muscle tone. He displays no seizure activity. Coordination normal.  Skin: Skin is warm and dry. No rash noted.  Psychiatric: He has a normal mood and affect.  Nursing note and vitals reviewed.    ED Treatments / Results  Labs (all labs ordered are listed, but only abnormal results are displayed) Labs Reviewed  BASIC METABOLIC PANEL - Abnormal; Notable for the following components:      Result Value   Glucose, Bld 104 (*)    All other components within normal limits  CBC  I-STAT TROPONIN, ED  I-STAT TROPONIN, ED  I-STAT TROPONIN, ED    EKG EKG Interpretation  Date/Time:  Monday May 25 2018 11:00:36 EDT Ventricular Rate:  65 PR Interval:  128 QRS Duration: 84 QT Interval:  402 QTC  Calculation: 418 R Axis:   53 Text Interpretation:  Normal sinus rhythm Normal ECG No significant change since last tracing Confirmed by Dorie Rank [phone]) on 05/25/2018 12:18:49 PM   Radiology Dg Chest 2 View  Result Date: 05/25/2018 CLINICAL DATA:  Chest pain EXAM: CHEST - 2 VIEW COMPARISON:  12/23/2016 FINDINGS: The heart size and mediastinal contours are within normal limits. Both lungs are clear. The visualized skeletal structures are unremarkable. Trachea is midline. Coronary stents noted. Artifact overlies the right upper chest externally. Normal bowel gas pattern. No acute osseous finding. IMPRESSION: No active cardiopulmonary disease. Electronically Signed   By: Jerilynn Mages.  Shick M.D.   On: 05/25/2018 11:20    Procedures Procedures (including critical care time)  Medications Ordered in ED Medications  nitroGLYCERIN (  NITROGLYN) 2 % ointment 1 inch (1 inch Topical Given 05/25/18 1305)  aspirin chewable tablet 324 mg (324 mg Oral Given 05/25/18 1258)     Initial Impression / Assessment and Plan / ED Course  I have reviewed the triage vital signs and the nursing notes.  Pertinent labs & imaging results that were available during my care of the patient were reviewed by me and considered in my medical decision making (see chart for details).  Clinical Course as of May 25 1813  Mon May 25, 2018  1450 Patient had a nuclear medicine stress test earlier this year in July.  Study Highlights     The left ventricular ejection fraction is normal (55-65%).  Nuclear stress EF: 63%.  Blood pressure demonstrated a normal response to exercise.  There was no ST segment deviation noted during stress.  The study is normal.  This is a low risk study     [JK]  1450 Serial troponins are negative   [JK]  1502 Discussed case with Dr Acie Fredrickson.  Will plan on a 6 hr troponin.  If normal, ok for discharge.   [JK]    Clinical Course User Index [JK] Dorie Rank, MD    Patient presents to the emergency  room for evaluation of chest pain.  Patient has a known history of coronary artery disease.  Patient's symptoms have been constant today.  Serial troponins are negative.  This argues against acute cardiac ischemia.  Patient had a normal stress test in July.  I will consult with his cardiologist in close outpatient follow-up.  I discussed the case with Dr. Acie Fredrickson.  We reviewed the patient's recent cardiac evaluation.  He recommends doing one additional troponin.  If the 6-hour troponin is normal patient can safely follow-up as an outpatient.  Patient 6-hour troponin is normal.  I doubt that his symptoms are related to acute coronary syndrome.  I will try him on Carafate in case this is worsening of his gastroesophageal reflux disease.  Signs and precautions discussed.  Final Clinical Impressions(s) / ED Diagnoses   Final diagnoses:  Chest pain, unspecified type    ED Discharge Orders         Ordered    sucralfate (CARAFATE) 1 g tablet  4 times daily     05/25/18 1813           Dorie Rank, MD 05/25/18 1824

## 2018-05-25 NOTE — Discharge Instructions (Addendum)
Follow-up with your cardiologist as we discussed, continue your antacids and try taking the Carafate

## 2018-05-27 NOTE — H&P (View-Only) (Signed)
Cardiology Office Note   Date:  05/28/2018   ID:  Kurt Diaz, DOB 04/27/1941, MRN [id number]  PCP:  Antony Contras, MD  Cardiologist:  Dr. [name]    Chief Complaint  Patient presents with  . Chest Pain    post ER visit      History of Present Illness: Kurt Diaz is a 77 y.o. male who presents for post hospitalization.  ER visit for chest pain.  Neg troponins.  carafate added.  CAD History of MI in 01-12-05 sudden death stenting of LAD and RCA.    Cardiac cath was done 10/25/16 with ostial 70% LAD with FFR 0.87, patent stent to prox LAD.  Moderate ostial to proximal RCA and severe distal RCA disease. Widely patent mid RCA stent.  The right coronary was treated with distal (2.25 mm x 18) and proximal (2.75 x 15) Onyx Resolute drug-eluting stents with reduction in stenosis from 85% and 75% to 0% and 0% respectively with TIMI grade 3 flow.  Patent LCX.  EF 45-50%.    Plan was for ASA and Brilinta for 30 days then ASA and Plavix.  It was noted his LAD disease needed to be monitored.    Had SSCP getting on plane 12/23/16 Aborted flight Seen by Dr Harrington Challenger in ER and r/o with no ECG changes Started on protonix ? Reflux. Not helpful Still getting resting pains right chest and jaw that are not relieved With nitro   Reviewed his films from 10/25/16:  Small vessels Good result with distal RCA stent diffuse disease PLA LAO caudal view showd 70% ostial LAD lesion but FFR .87   Biopsy of prostate this time ok  elevated PSA in 16 range Sees Otelin   Was having atypical resting right sided chest pain Saw GI Outlaw and given Nexium with some benefit. Pain is not exertional but does radiate to jaw --nuc study 04/07/18 with EF 63%, no ischemia normal study.    Pain continues on Lt side and cannot be recreated with movement   Cannot recreate with palpation.  No associated symptoms of nausea or SOB.  Usually is a one but intensifies to a 3.  He has not yet started Nexium or Carafate.  Does not  occur after meals and does not feel like his usual GERD.  reviewed last cath and has 70% RCA and 70% LAD stenosis concern for increased stenosis.     Past Medical History:  Diagnosis Date  . Chest pain   . Coronary artery disease   . Coronary syndrome, acute ([place]) 01-12-05  . Hyperlipidemia   . Hypertension   . Syncope and collapse     Past Surgical History:  Procedure Laterality Date  . CARDIAC CATHETERIZATION  10/13/2009   EF 65%  . CARDIAC CATHETERIZATION N/A 10/25/2016   Procedure: Left Heart Cath and Coronary Angiography;  Surgeon: Belva Crome, MD;  Location: [facility];  Service: Cardiovascular;  Laterality: N/A;  . CARDIAC CATHETERIZATION N/A 10/25/2016   Procedure: Coronary Stent Intervention;  Surgeon: Belva Crome, MD;  Location: [place] [facility];  Service: Cardiovascular;  Laterality: N/A;  . CARDIAC CATHETERIZATION N/A 10/25/2016   Procedure: Intravascular Pressure Wire/FFR Study;  Surgeon: Belva Crome, MD;  Location: [facility];  Service: Cardiovascular;  Laterality: N/A;  . CARDIOVASCULAR STRESS TEST  09/29/2009   EF 70%  . CORONARY ANGIOPLASTY     STENTING OF HIS LAD, STENTING OF HIS RIGHT CORONARY.  Marland Kitchen US ECHOCARDIOGRAPHY  12/15/2009   EF 55-60%     Current Outpatient Medications  Medication Sig Dispense Refill  . aspirin EC 81 MG tablet Take 81 mg by mouth daily after supper.    Marland Kitchen atorvastatin (LIPITOR) 80 MG tablet Take 1 tablet (80 mg total) by mouth daily at 6 PM. 30 tablet 9  . ibuprofen (ADVIL,MOTRIN) 200 MG tablet Take 400 mg by mouth 2 (two) times daily as needed for headache or moderate pain.     Marland Kitchen lisinopril (PRINIVIL,ZESTRIL) 20 MG tablet Take 1 tablet (20 mg total) by mouth 2 (two) times daily. (Patient taking differently: Take 40 mg by mouth daily. ) 60 tablet 11  . Multiple Vitamin (MULTIVITAMIN WITH MINERALS) TABS tablet Take 1 tablet by mouth daily after supper.     . nitroGLYCERIN (NITROSTAT) 0.4 MG SL tablet Place 1 tablet (0.4 mg  total) under the tongue every 5 (five) minutes as needed for chest pain. 25 tablet 3  . esomeprazole (NEXIUM) 40 MG capsule Take 1 capsule (40 mg total) by mouth daily at 12 noon.    . isosorbide mononitrate (IMDUR) 30 MG 24 hr tablet Take 0.5 tablets (15 mg total) by mouth daily. 90 tablet 3  . sucralfate (CARAFATE) 1 g tablet Take 1 tablet (1 g total) by mouth 4 (four) times daily. (Patient not taking: Reported on 05/28/2018) 28 tablet 0   No current facility-administered medications for this visit.     Allergies:   Patient has no known allergies.    Social History:  The patient  reports that he has never smoked. He has never used smokeless tobacco. He reports that he does not drink alcohol or use drugs.   Family History:  The patient's family history includes Heart attack in his mother; Lung cancer in his father; Stroke in his mother.    ROS:  General:no colds or fevers, no weight changes Skin:no rashes or ulcers HEENT:no blurred vision, no congestion CV:see HPI PUL:see HPI GI:no diarrhea constipation or melena, no indigestion GU:no hematuria, no dysuria MS:no joint pain, no claudication Neuro:no syncope, no lightheadedness Endo:no diabetes, no thyroid disease  Wt Readings from Last 3 Encounters:  05/28/18 191 lb (86.6 kg)  04/07/18 190 lb (86.2 kg)  03/27/18 190 lb 4 oz (86.3 kg)     PHYSICAL EXAM: VS:  BP 122/62   Pulse (!) 56   Ht 5\' 8"  (1.727 m)   Wt 191 lb (86.6 kg)   BMI 29.04 kg/m  , BMI Body mass index is 29.04 kg/m. General:Pleasant affect, NAD Skin:Warm and dry, brisk capillary refill HEENT:normocephalic, sclera clear, mucus membranes moist Neck:supple, no JVD, no bruits  Heart:S1S2 RRR without murmur, gallup, rub or click Lungs:clear without rales, rhonchi, or wheezes YBO:FBPZ, non tender, + BS, do not palpate liver spleen or masses Ext:no lower ext edema, 2+ pedal pulses, 2+ radial pulses Neuro:alert and oriented, MAE, follows commands, + facial  symmetry    EKG:  EKG is ordered today. The ekg ordered today demonstrates SB at 51 no acute EKG changes.  Q waves in II, III, AVF for old inf MI   Recent Labs: 05/25/2018: BUN 16; Creatinine, Ser 0.93; Hemoglobin 15.1; Platelets 249; Potassium 4.3; Sodium 137    Lipid Panel    Component Value Date/Time   CHOL 99 (L) 12/16/2016 0730   TRIG 75 12/16/2016 0730   HDL 34 (L) 12/16/2016 0730   CHOLHDL 2.9 12/16/2016 0730   CHOLHDL 4 12/30/2014 0741   VLDL 20.6 12/30/2014 0741  [place] 50 12/16/2016 0730       Other studies Reviewed: Additional studies/ records that were reviewed today include:   Cardiac cath 10/25/17  .  Recurring angina at rest in this 77 year old gentleman with history of right and left coronary drug-eluting stents greater than 10 years ago.  Ostial 70% LAD with FFR identified to be 0.87.  Widely patent proximal LAD stent.  Moderate ostial to proximal RCA and severe distal RCA disease. Widely patent mid RCA stent.  Widely patent/normal circumflex coronary artery.  The right coronary was treated with distal (2.25 mm x 18) and proximal (2.75 x 15) Onyx Resolute drug-eluting stents with reduction in stenosis from 85% and 75% to 0% and 0% respectively with TIMI grade 3 flow.  Overall normal LV function, mild mid anterior wall hypokinesis. EF 45-50%.  Recommendations:  Aspirin and Brilinta for 30 days then switch to aspirin and Plavix.  Discharge in a.m. if no problems.  High intensity statin therapy.  Continue to follow for progression of ostial LAD disease at which time LIMA LAD should be a consideration although depending upon his age, may not be practical.  Nuc study 04/07/18  The left ventricular ejection fraction is normal (55-65%).  Nuclear stress EF: 63%.  Blood pressure demonstrated a normal response to exercise.  There was no ST segment deviation noted during stress.  The study is normal.  This is a low risk study.   Normal stress  nuclear study with no ischemia or infarction; EF 63 with normal wall motion.    ASSESSMENT AND PLAN:  1.  Chest pain continues.  Has typical and atypical symptoms.  But with his amount of CAD and stents and known residual RCA and LAD disease cardiac cath would help determine cause of pain.  Discussed with Dr. [name] and plan for cardiac cath --will add imdur 15 mg daily and have asked him to take the nexium.  Cath will be for Tuesday.    The patient understands that risks included but are not limited to stroke (1 in 1000), death (1 in 49), kidney failure [usually temporary] (1 in 500), bleeding (1 in 200), allergic reaction [possibly serious] (1 in 200).   2.  CAD with numerous stents in RCA and LAD, last PCI 10/2016 -continues on ASA but plavix stopped 1 year post PCI.  Hx of MI in 12/28/2004 with sudden death.    3.   HLD on lipitor 80 continue last LDL of 79  4.   LV dysfunction with cath 45-50% and f/u stress test EF 61%.    He will follow up 2 weeks post cath.     Current medicines are reviewed with the patient today.  The patient Has no concerns regarding medicines.  The following changes have been made:  See above Labs/ tests ordered today include:see above  Disposition:   FU:  see above  Signed, [name], NP  05/28/2018 9:32 AM    [facility], [place], [place] Phone: [phone]; Fax: [phone]

## 2018-05-27 NOTE — Progress Notes (Signed)
Cardiology Office Note   Date:  05/28/2018   ID:  Kurt Diaz, DOB [birth year], MRN [id number]  PCP:  Antony Contras, MD  Cardiologist:  Dr. [name]    Chief Complaint  Patient presents with  . Chest Pain    post ER visit      History of Present Illness: Kurt Diaz is a 77 y.o. male who presents for post hospitalization.  ER visit for chest pain.  Neg troponins.  carafate added.  CAD History of MI in 01-Jan-2005 sudden death stenting of LAD and RCA.    Cardiac cath was done 10/25/16 with ostial 70% LAD with FFR 0.87, patent stent to prox LAD.  Moderate ostial to proximal RCA and severe distal RCA disease. Widely patent mid RCA stent.  The right coronary was treated with distal (2.25 mm x 18) and proximal (2.75 x 15) Onyx Resolute drug-eluting stents with reduction in stenosis from 85% and 75% to 0% and 0% respectively with TIMI grade 3 flow.  Patent LCX.  EF 45-50%.    Plan was for ASA and Brilinta for 30 days then ASA and Plavix.  It was noted his LAD disease needed to be monitored.    Had SSCP getting on plane 12/23/16 Aborted flight Seen by Dr Harrington Challenger in ER and r/o with no ECG changes Started on protonix ? Reflux. Not helpful Still getting resting pains right chest and jaw that are not relieved With nitro   Reviewed his films from 10/25/16:  Small vessels Good result with distal RCA stent diffuse disease PLA LAO caudal view showd 70% ostial LAD lesion but FFR .87   Biopsy of prostate this time ok  elevated PSA in 16 range Sees Otelin   Was having atypical resting right sided chest pain Saw GI Outlaw and given Nexium with some benefit. Pain is not exertional but does radiate to jaw --nuc study 04/07/18 with EF 63%, no ischemia normal study.    Pain continues on Lt side and cannot be recreated with movement   Cannot recreate with palpation.  No associated symptoms of nausea or SOB.  Usually is a one but intensifies to a 3.  He has not yet started Nexium or Carafate.  Does not  occur after meals and does not feel like his usual GERD.  reviewed last cath and has 70% RCA and 70% LAD stenosis concern for increased stenosis.     Past Medical History:  Diagnosis Date  . Chest pain   . Coronary artery disease   . Coronary syndrome, acute ([place]) 01/01/2005  . Hyperlipidemia   . Hypertension   . Syncope and collapse     Past Surgical History:  Procedure Laterality Date  . CARDIAC CATHETERIZATION  10/13/2009   EF 65%  . CARDIAC CATHETERIZATION N/A 10/25/2016   Procedure: Left Heart Cath and Coronary Angiography;  Surgeon: Belva Crome, MD;  Location: [place] [facility];  Service: Cardiovascular;  Laterality: N/A;  . CARDIAC CATHETERIZATION N/A 10/25/2016   Procedure: Coronary Stent Intervention;  Surgeon: Belva Crome, MD;  Location: [place] [facility] [facility];  Service: Cardiovascular;  Laterality: N/A;  . CARDIAC CATHETERIZATION N/A 10/25/2016   Procedure: Intravascular Pressure Wire/FFR Study;  Surgeon: Belva Crome, MD;  Location: [facility];  Service: Cardiovascular;  Laterality: N/A;  . CARDIOVASCULAR STRESS TEST  09/29/2009   EF 70%  . CORONARY ANGIOPLASTY     STENTING OF HIS LAD, STENTING OF HIS RIGHT CORONARY.  Marland Kitchen US ECHOCARDIOGRAPHY  12/15/2009   EF 55-60%     Current Outpatient Medications  Medication Sig Dispense Refill  . aspirin EC 81 MG tablet Take 81 mg by mouth daily after supper.    Marland Kitchen atorvastatin (LIPITOR) 80 MG tablet Take 1 tablet (80 mg total) by mouth daily at 6 PM. 30 tablet 9  . ibuprofen (ADVIL,MOTRIN) 200 MG tablet Take 400 mg by mouth 2 (two) times daily as needed for headache or moderate pain.     Marland Kitchen lisinopril (PRINIVIL,ZESTRIL) 20 MG tablet Take 1 tablet (20 mg total) by mouth 2 (two) times daily. (Patient taking differently: Take 40 mg by mouth daily. ) 60 tablet 11  . Multiple Vitamin (MULTIVITAMIN WITH MINERALS) TABS tablet Take 1 tablet by mouth daily after supper.     . nitroGLYCERIN (NITROSTAT) 0.4 MG SL tablet Place 1 tablet (0.4 mg  total) under the tongue every 5 (five) minutes as needed for chest pain. 25 tablet 3  . esomeprazole (NEXIUM) 40 MG capsule Take 1 capsule (40 mg total) by mouth daily at 12 noon.    . isosorbide mononitrate (IMDUR) 30 MG 24 hr tablet Take 0.5 tablets (15 mg total) by mouth daily. 90 tablet 3  . sucralfate (CARAFATE) 1 g tablet Take 1 tablet (1 g total) by mouth 4 (four) times daily. (Patient not taking: Reported on 05/28/2018) 28 tablet 0   No current facility-administered medications for this visit.     Allergies:   Patient has no known allergies.    Social History:  The patient  reports that he has never smoked. He has never used smokeless tobacco. He reports that he does not drink alcohol or use drugs.   Family History:  The patient's family history includes Heart attack in his mother; Lung cancer in his father; Stroke in his mother.    ROS:  General:no colds or fevers, no weight changes Skin:no rashes or ulcers HEENT:no blurred vision, no congestion CV:see HPI PUL:see HPI GI:no diarrhea constipation or melena, no indigestion GU:no hematuria, no dysuria MS:no joint pain, no claudication Neuro:no syncope, no lightheadedness Endo:no diabetes, no thyroid disease  Wt Readings from Last 3 Encounters:  05/28/18 191 lb (86.6 kg)  04/07/18 190 lb (86.2 kg)  03/27/18 190 lb 4 oz (86.3 kg)     PHYSICAL EXAM: VS:  BP 122/62   Pulse (!) 56   Ht 5\' 8"  (1.727 m)   Wt 191 lb (86.6 kg)   BMI 29.04 kg/m  , BMI Body mass index is 29.04 kg/m. General:Pleasant affect, NAD Skin:Warm and dry, brisk capillary refill HEENT:normocephalic, sclera clear, mucus membranes moist Neck:supple, no JVD, no bruits  Heart:S1S2 RRR without murmur, gallup, rub or click Lungs:clear without rales, rhonchi, or wheezes STM:HDQQ, non tender, + BS, do not palpate liver spleen or masses Ext:no lower ext edema, 2+ pedal pulses, 2+ radial pulses Neuro:alert and oriented, MAE, follows commands, + facial  symmetry    EKG:  EKG is ordered today. The ekg ordered today demonstrates SB at 49 no acute EKG changes.  Q waves in II, III, AVF for old inf MI   Recent Labs: 05/25/2018: BUN 16; Creatinine, Ser 0.93; Hemoglobin 15.1; Platelets 249; Potassium 4.3; Sodium 137    Lipid Panel    Component Value Date/Time   CHOL 99 (L) 12/16/2016 0730   TRIG 75 12/16/2016 0730   HDL 34 (L) 12/16/2016 0730   CHOLHDL 2.9 12/16/2016 0730   CHOLHDL 4 12/30/2014 0741   VLDL 20.6 12/30/2014 0741  [place] 50 12/16/2016 0730       Other studies Reviewed: Additional studies/ records that were reviewed today include:   Cardiac cath 10/25/17  .  Recurring angina at rest in this 77 year old gentleman with history of right and left coronary drug-eluting stents greater than 10 years ago.  Ostial 70% LAD with FFR identified to be 0.87.  Widely patent proximal LAD stent.  Moderate ostial to proximal RCA and severe distal RCA disease. Widely patent mid RCA stent.  Widely patent/normal circumflex coronary artery.  The right coronary was treated with distal (2.25 mm x 18) and proximal (2.75 x 15) Onyx Resolute drug-eluting stents with reduction in stenosis from 85% and 75% to 0% and 0% respectively with TIMI grade 3 flow.  Overall normal LV function, mild mid anterior wall hypokinesis. EF 45-50%.  Recommendations:  Aspirin and Brilinta for 30 days then switch to aspirin and Plavix.  Discharge in a.m. if no problems.  High intensity statin therapy.  Continue to follow for progression of ostial LAD disease at which time LIMA LAD should be a consideration although depending upon his age, may not be practical.  Nuc study 04/07/18  The left ventricular ejection fraction is normal (55-65%).  Nuclear stress EF: 63%.  Blood pressure demonstrated a normal response to exercise.  There was no ST segment deviation noted during stress.  The study is normal.  This is a low risk study.   Normal stress  nuclear study with no ischemia or infarction; EF 63 with normal wall motion.    ASSESSMENT AND PLAN:  1.  Chest pain continues.  Has typical and atypical symptoms.  But with his amount of CAD and stents and known residual RCA and LAD disease cardiac cath would help determine cause of pain.  Discussed with Dr. [name] and plan for cardiac cath --will add imdur 15 mg daily and have asked him to take the nexium.  Cath will be for Tuesday.    The patient understands that risks included but are not limited to stroke (1 in 1000), death (1 in 52), kidney failure [usually temporary] (1 in 500), bleeding (1 in 200), allergic reaction [possibly serious] (1 in 200).   2.  CAD with numerous stents in RCA and LAD, last PCI 10/2016 -continues on ASA but plavix stopped 1 year post PCI.  Hx of MI in 12/27/04 with sudden death.    3.   HLD on lipitor 80 continue last LDL of 79  4.   LV dysfunction with cath 45-50% and f/u stress test EF 61%.    He will follow up 2 weeks post cath.     Current medicines are reviewed with the patient today.  The patient Has no concerns regarding medicines.  The following changes have been made:  See above Labs/ tests ordered today include:see above  Disposition:   FU:  see above  Signed, [name], NP  05/28/2018 9:32 AM    [facility], [place], [place] Phone: [phone]; Fax: [phone]

## 2018-05-28 ENCOUNTER — Ambulatory Visit (INDEPENDENT_AMBULATORY_CARE_PROVIDER_SITE_OTHER): Payer: BLUE CROSS/BLUE SHIELD | Admitting: Cardiology

## 2018-05-28 ENCOUNTER — Encounter: Payer: Self-pay | Admitting: Cardiology

## 2018-05-28 VITALS — BP 122/62 | HR 56 | Ht 68.0 in | Wt 191.0 lb

## 2018-05-28 DIAGNOSIS — Z95820 Peripheral vascular angioplasty status with implants and grafts: Secondary | ICD-10-CM | POA: Diagnosis not present

## 2018-05-28 DIAGNOSIS — E7800 Pure hypercholesterolemia, unspecified: Secondary | ICD-10-CM | POA: Diagnosis not present

## 2018-05-28 DIAGNOSIS — I2510 Atherosclerotic heart disease of native coronary artery without angina pectoris: Secondary | ICD-10-CM

## 2018-05-28 DIAGNOSIS — I252 Old myocardial infarction: Secondary | ICD-10-CM

## 2018-05-28 DIAGNOSIS — R079 Chest pain, unspecified: Secondary | ICD-10-CM | POA: Diagnosis not present

## 2018-05-28 DIAGNOSIS — I25110 Atherosclerotic heart disease of native coronary artery with unstable angina pectoris: Secondary | ICD-10-CM | POA: Diagnosis not present

## 2018-05-28 LAB — BASIC METABOLIC PANEL
BUN/Creatinine Ratio: 19 (ref 10–24)
BUN: 18 mg/dL (ref 8–27)
CO2: 24 mmol/L (ref 20–29)
Calcium: 9.1 mg/dL (ref 8.6–10.2)
Chloride: 102 mmol/L (ref 96–106)
Creatinine, Ser: 0.94 mg/dL (ref 0.76–1.27)
GFR calc Af Amer: 90 mL/min/{1.73_m2} (ref >59)
GFR calc non Af Amer: 78 mL/min/{1.73_m2} (ref >59)
Glucose: 105 mg/dL — ABNORMAL HIGH (ref 65–99)
Potassium: 4.5 mmol/L (ref 3.5–5.2)
Sodium: 140 mmol/L (ref 134–144)

## 2018-05-28 MED ORDER — ESOMEPRAZOLE MAGNESIUM 40 MG PO CPDR: 40.0000 mg | DELAYED_RELEASE_CAPSULE | Freq: Every day | ORAL | Status: DC

## 2018-05-28 MED ORDER — ISOSORBIDE MONONITRATE ER 30 MG PO TB24: 15.0000 mg | ORAL_TABLET | Freq: Every day | ORAL | 3 refills | Status: DC

## 2018-05-28 NOTE — Patient Instructions (Addendum)
Medication Instructions:  1. START IMDUR 30 MG WITH DIRECTIONS TO TAKE 1/2 TABLET DAILY = 15 MG DAILY; RX HAS BEEN SENT IN  2. START NEXIUM 40 MG DAILY  Labwork: BMET TODAY  Testing/Procedures: Your physician has requested that you have a cardiac catheterization. Cardiac catheterization is used to diagnose and/or treat various heart conditions. Doctors may recommend this procedure for a number of different reasons. The most common reason is to evaluate chest pain. Chest pain can be a symptom of coronary artery disease (CAD), and cardiac catheterization can show whether plaque is narrowing or blocking your heart's arteries. This procedure is also used to evaluate the valves, as well as measure the blood flow and oxygen levels in different parts of your heart. For further information please visit [email or web address]. Please follow instruction sheet, as given.     Follow-Up: POST PROCEDURE FOLLOW UP WITH Kurt Kicks, NP 06/17/18 @ 11 AM   Any Other Special Instructions Will Be Listed Below (If Applicable).  If you need a refill on your cardiac medications before your next appointment, please call your pharmacy.     [facility] [place], [place] [address] Dept: [phone] Loc: [phone]  Kurt Diaz  05/28/2018  You are scheduled for a Cardiac Catheterization on Tuesday, September 10 with Dr. Daneen Diaz at 7:30 AM.  1. Please arrive at the [facility] (Main Entrance A) at [facility]: [place], [place] [address] at 5:30 AM (This time is two hours before your procedure to ensure your preparation). Free valet parking service is available.   Special note: Every effort is made to have your procedure done on time. Please understand that emergencies sometimes delay scheduled procedures.  2. Diet: Do not eat solid foods after midnight.  The patient may have  clear liquids until 5am upon the day of the procedure.  3. Labs: You will need to have blood drawn on Thursday, September 5 at [facility] at [facility]. [address]  Open: 7:30am - 5pm    Phone: [phone]. You do not need to be fasting.  4. Medication instructions in preparation for your procedure:     HOLD LISINOPRIL THE MORNING OF CATH ON 06/02/18   Contrast Allergy: No  On the morning of your procedure, take your Aspirin 81 MG and any morning medicines NOT listed above.  You may use sips of water.  5. Plan for one night stay--bring personal belongings. 6. Bring a current list of your medications and current insurance cards. 7. You MUST have a responsible person to drive you home. 8. Someone MUST be with you the first 24 hours after you arrive home or your discharge will be delayed. 9. Please wear clothes that are easy to get on and off and wear slip-on shoes.  Thank you for allowing [place] to care for you!   -- [facility]

## 2018-06-04 ENCOUNTER — Telehealth: Payer: Self-pay | Admitting: *Deleted

## 2018-06-04 NOTE — Telephone Encounter (Signed)
Voice mail/no answer 

## 2018-06-04 NOTE — Telephone Encounter (Addendum)
Pt contacted pre-catheterization scheduled at [facility] for: Monday June 08, 2018 7:30 AM Verified arrival time and place: [place] Entrance A at: 5:30 AM  No solid food after midnight prior to cath, clear liquids until 5 AM day of procedure. Verified allergies in Epic Verified no diabetes medications.  AM meds can be  taken pre-cath with sip of water including: ASA 81 mg  Confirmed patient has responsible person to drive home post procedure and for 24 hours after you arrive home  LMTCB to discuss instructions with patient.  I spoke with patient, he said he had written instructions provided to him and he did not need to review them. I did remind him to take ASA 81 mg prior to going to hospital AM of procedure.

## 2018-06-08 ENCOUNTER — Ambulatory Visit (HOSPITAL_COMMUNITY)
Admission: RE | Disposition: A | Discharge status: Home / Self Care | Payer: Self-pay | Source: Ambulatory Visit | Attending: Internal Medicine

## 2018-06-08 ENCOUNTER — Ambulatory Visit (HOSPITAL_COMMUNITY)
Admission: RE | Admit: 2018-06-08 | Discharge: 2018-06-08 | Disposition: A | Discharge status: Home / Self Care | Payer: BLUE CROSS/BLUE SHIELD | Source: Ambulatory Visit | Attending: Internal Medicine | Admitting: Internal Medicine

## 2018-06-08 ENCOUNTER — Encounter (HOSPITAL_COMMUNITY): Payer: Self-pay | Admitting: Internal Medicine

## 2018-06-08 DIAGNOSIS — T82855A Stenosis of coronary artery stent, initial encounter: Secondary | ICD-10-CM | POA: Insufficient documentation

## 2018-06-08 DIAGNOSIS — K219 Gastro-esophageal reflux disease without esophagitis: Secondary | ICD-10-CM | POA: Diagnosis not present

## 2018-06-08 DIAGNOSIS — Y831 Surgical operation with implant of artificial internal device as the cause of abnormal reaction of the patient, or of later complication, without mention of misadventure at the time of the procedure: Secondary | ICD-10-CM | POA: Diagnosis not present

## 2018-06-08 DIAGNOSIS — I25119 Atherosclerotic heart disease of native coronary artery with unspecified angina pectoris: Secondary | ICD-10-CM | POA: Insufficient documentation

## 2018-06-08 DIAGNOSIS — I10 Essential (primary) hypertension: Secondary | ICD-10-CM | POA: Insufficient documentation

## 2018-06-08 DIAGNOSIS — Z7982 Long term (current) use of aspirin: Secondary | ICD-10-CM | POA: Diagnosis not present

## 2018-06-08 DIAGNOSIS — I252 Old myocardial infarction: Secondary | ICD-10-CM | POA: Diagnosis not present

## 2018-06-08 DIAGNOSIS — E785 Hyperlipidemia, unspecified: Secondary | ICD-10-CM | POA: Insufficient documentation

## 2018-06-08 HISTORY — PX: LEFT HEART CATH AND CORONARY ANGIOGRAPHY: CATH118249

## 2018-06-08 HISTORY — PX: INTRAVASCULAR PRESSURE WIRE/FFR STUDY: CATH118243

## 2018-06-08 LAB — POCT ACTIVATED CLOTTING TIME: Activated Clotting Time: 252 seconds

## 2018-06-08 SURGERY — LEFT HEART CATH AND CORONARY ANGIOGRAPHY
Anesthesia: LOCAL

## 2018-06-08 MED ORDER — ADENOSINE 12 MG/4ML IV SOLN
INTRAVENOUS | Status: AC
  Filled 2018-06-08: qty 16

## 2018-06-08 MED ORDER — FENTANYL CITRATE (PF) 100 MCG/2ML IJ SOLN
INTRAMUSCULAR | Status: DC | PRN
  Administered 2018-06-08: 25 ug via INTRAVENOUS

## 2018-06-08 MED ORDER — LIDOCAINE HCL (PF) 1 % IJ SOLN
INTRAMUSCULAR | Status: AC
  Filled 2018-06-08: qty 30

## 2018-06-08 MED ORDER — HEPARIN (PORCINE) IN NACL 1000-0.9 UT/500ML-% IV SOLN
INTRAVENOUS | Status: AC
  Filled 2018-06-08: qty 1000

## 2018-06-08 MED ORDER — HEPARIN SODIUM (PORCINE) 1000 UNIT/ML IJ SOLN
INTRAMUSCULAR | Status: AC
  Filled 2018-06-08: qty 1

## 2018-06-08 MED ORDER — ASPIRIN 81 MG PO CHEW: 81.0000 mg | CHEWABLE_TABLET | ORAL | Status: DC

## 2018-06-08 MED ORDER — LIDOCAINE HCL (PF) 1 % IJ SOLN
INTRAMUSCULAR | Status: DC | PRN
  Administered 2018-06-08: 2 mL

## 2018-06-08 MED ORDER — MIDAZOLAM HCL 2 MG/2ML IJ SOLN
INTRAMUSCULAR | Status: AC
  Filled 2018-06-08: qty 2

## 2018-06-08 MED ORDER — NITROGLYCERIN 1 MG/10 ML FOR IR/CATH LAB
INTRA_ARTERIAL | Status: DC | PRN
  Administered 2018-06-08: 200 ug via INTRACORONARY

## 2018-06-08 MED ORDER — SODIUM CHLORIDE 0.9 % WEIGHT BASED INFUSION
3.0000 mL/kg/h | INTRAVENOUS | Status: DC
  Administered 2018-06-08: 3 mL/kg/h via INTRAVENOUS

## 2018-06-08 MED ORDER — NITROGLYCERIN 1 MG/10 ML FOR IR/CATH LAB
INTRA_ARTERIAL | Status: AC
  Filled 2018-06-08: qty 10

## 2018-06-08 MED ORDER — SODIUM CHLORIDE 0.9% FLUSH: 3.0000 mL | Freq: Two times a day (BID) | INTRAVENOUS | Status: DC

## 2018-06-08 MED ORDER — HEPARIN SODIUM (PORCINE) 1000 UNIT/ML IJ SOLN
INTRAMUSCULAR | Status: DC | PRN
  Administered 2018-06-08: 4500 [IU] via INTRAVENOUS
  Administered 2018-06-08: 2000 [IU] via INTRAVENOUS
  Administered 2018-06-08: 4500 [IU] via INTRAVENOUS

## 2018-06-08 MED ORDER — VERAPAMIL HCL 2.5 MG/ML IV SOLN
INTRAVENOUS | Status: AC
  Filled 2018-06-08: qty 2

## 2018-06-08 MED ORDER — MIDAZOLAM HCL 2 MG/2ML IJ SOLN
INTRAMUSCULAR | Status: DC | PRN
  Administered 2018-06-08: 1 mg via INTRAVENOUS

## 2018-06-08 MED ORDER — ADENOSINE (DIAGNOSTIC) 140MCG/KG/MIN
INTRAVENOUS | Status: DC | PRN
  Administered 2018-06-08: 140 ug/kg/min via INTRAVENOUS

## 2018-06-08 MED ORDER — SODIUM CHLORIDE 0.9 % WEIGHT BASED INFUSION: 1.0000 mL/kg/h | INTRAVENOUS | Status: DC

## 2018-06-08 MED ORDER — IOHEXOL 350 MG/ML SOLN
INTRAVENOUS | Status: DC | PRN
  Administered 2018-06-08: 100 mL

## 2018-06-08 MED ORDER — ISOSORBIDE MONONITRATE ER 30 MG PO TB24: 30.0000 mg | ORAL_TABLET | Freq: Every day | ORAL | 3 refills | Status: DC

## 2018-06-08 MED ORDER — HEPARIN (PORCINE) IN NACL 1000-0.9 UT/500ML-% IV SOLN
INTRAVENOUS | Status: DC | PRN
  Administered 2018-06-08 (×2): 500 mL

## 2018-06-08 MED ORDER — SODIUM CHLORIDE 0.9% FLUSH: 3.0000 mL | INTRAVENOUS | Status: DC | PRN

## 2018-06-08 MED ORDER — FENTANYL CITRATE (PF) 100 MCG/2ML IJ SOLN
INTRAMUSCULAR | Status: AC
  Filled 2018-06-08: qty 2

## 2018-06-08 MED ORDER — SODIUM CHLORIDE 0.9 % IV SOLN: 250.0000 mL | INTRAVENOUS | Status: DC | PRN

## 2018-06-08 SURGICAL SUPPLY — 13 items
CATH 5FR JL3.5 JR4 ANG PIG MP (CATHETERS) ×2 IMPLANT
CATH VISTA GUIDE 6FR XBLAD3.0 (CATHETERS) ×2 IMPLANT
DEVICE RAD COMP TR BAND LRG (VASCULAR PRODUCTS) ×2 IMPLANT
GLIDESHEATH SLEND SS 6F .021 (SHEATH) ×2 IMPLANT
GUIDEWIRE INQWIRE 1.5J.035X260 (WIRE) ×1 IMPLANT
GUIDEWIRE PRESSURE COMET II (WIRE) ×2 IMPLANT
INQWIRE 1.5J .035X260CM (WIRE) ×2
KIT ESSENTIALS PG (KITS) ×2 IMPLANT
KIT HEART LEFT (KITS) ×2 IMPLANT
PACK CARDIAC CATHETERIZATION (CUSTOM PROCEDURE TRAY) ×2 IMPLANT
SYR MEDRAD MARK V 150ML (SYRINGE) ×2 IMPLANT
TRANSDUCER W/STOPCOCK (MISCELLANEOUS) ×2 IMPLANT
TUBING CIL FLEX 10 FLL-RA (TUBING) ×2 IMPLANT

## 2018-06-08 NOTE — Interval H&P Note (Signed)
History and Physical Interval Note:  06/08/2018 7:11 AM  Kurt Diaz  has presented today for cardiac catheterization, with the diagnosis of chest pain. The various methods of treatment have been discussed with the patient and family. After consideration of risks, benefits and other options for treatment, the patient has consented to  Procedure(s): LEFT HEART CATH AND CORONARY ANGIOGRAPHY (N/A) as a surgical intervention .  The patient's history has been reviewed, patient examined, no change in status, stable for surgery.  I have reviewed the patient's chart and labs.  Questions were answered to the patient's satisfaction.    Cath Lab Visit (complete for each Cath Lab visit)  Clinical Evaluation Leading to the Procedure:   ACS: No.  Non-ACS:    Anginal Classification: CCS IV  Anti-ischemic medical therapy: Minimal Therapy (1 class of medications)   Non-Invasive Test Results: No non-invasive testing performed  Prior CABG: No previous CABG  Katlyn Muldrew

## 2018-06-08 NOTE — Discharge Instructions (Signed)
Radial Site Care °Refer to this sheet in the next few weeks. These instructions provide you with information about caring for yourself after your procedure. Your health care provider may also give you more specific instructions. Your treatment has been planned according to current medical practices, but problems sometimes occur. Call your health care provider if you have any problems or questions after your procedure. °What can I expect after the procedure? °After your procedure, it is typical to have the following: °· Bruising at the radial site that usually fades within 1-2 weeks. °· Blood collecting in the tissue (hematoma) that may be painful to the touch. It should usually decrease in size and tenderness within 1-2 weeks. ° °Follow these instructions at home: °· Take medicines only as directed by your health care provider. °· You may shower 24-48 hours after the procedure or as directed by your health care provider. Remove the bandage (dressing) and gently wash the site with plain soap and water. Pat the area dry with a clean towel. Do not rub the site, because this may cause bleeding. °· Do not take baths, swim, or use a hot tub until your health care provider approves. °· Check your insertion site every day for redness, swelling, or drainage. °· Do not apply powder or lotion to the site. °· Do not flex or bend the affected arm for 24 hours or as directed by your health care provider. °· Do not push or pull heavy objects with the affected arm for 24 hours or as directed by your health care provider. °· Do not lift over 10 lb (4.5 kg) for 5 days after your procedure or as directed by your health care provider. °· Ask your health care provider when it is okay to: °? Return to work or school. °? Resume usual physical activities or sports. °? Resume sexual activity. °· Do not drive home if you are discharged the same day as the procedure. Have someone else drive you. °· You may drive 24 hours after the procedure  unless otherwise instructed by your health care provider. °· Do not operate machinery or power tools for 24 hours after the procedure. °· If your procedure was done as an outpatient procedure, which means that you went home the same day as your procedure, a responsible adult should be with you for the first 24 hours after you arrive home. °· Keep all follow-up visits as directed by your health care provider. This is important. °Contact a health care provider if: °· You have a fever. °· You have chills. °· You have increased bleeding from the radial site. Hold pressure on the site. °Get help right away if: °· You have unusual pain at the radial site. °· You have redness, warmth, or swelling at the radial site. °· You have drainage (other than a small amount of blood on the dressing) from the radial site. °· The radial site is bleeding, and the bleeding does not stop after 30 minutes of holding steady pressure on the site. °· Your arm or hand becomes pale, cool, tingly, or numb. °This information is not intended to replace advice given to you by your health care provider. Make sure you discuss any questions you have with your health care provider. °Document Released: 10/12/2010 Document Revised: 02/15/2016 Document Reviewed: 03/28/2014 °[employer] Interactive Patient Education © 2018 [employer] ° ° ° °Moderate Conscious Sedation, Adult, Care After °These instructions provide you with information about caring for yourself after your procedure. Your health care provider   may also give you more specific instructions. Your treatment has been planned according to current medical practices, but problems sometimes occur. Call your health care provider if you have any problems or questions after your procedure. °What can I expect after the procedure? °After your procedure, it is common: °· To feel sleepy for several hours. °· To feel clumsy and have poor balance for several hours. °· To have poor judgment for several  hours. °· To vomit if you eat too soon. ° °Follow these instructions at home: °For at least 24 hours after the procedure: ° °· Do not: °? Participate in activities where you could fall or become injured. °? Drive. °? Use heavy machinery. °? Drink alcohol. °? Take sleeping pills or medicines that cause drowsiness. °? Make important decisions or sign legal documents. °? Take care of children on your own. °· Rest. °Eating and drinking °· Follow the diet recommended by your health care provider. °· If you vomit: °? Drink water, juice, or soup when you can drink without vomiting. °? Make sure you have little or no nausea before eating solid foods. °General instructions °· Have a responsible adult stay with you until you are awake and alert. °· Take over-the-counter and prescription medicines only as told by your health care provider. °· If you smoke, do not smoke without supervision. °· Keep all follow-up visits as told by your health care provider. This is important. °Contact a health care provider if: °· You keep feeling nauseous or you keep vomiting. °· You feel light-headed. °· You develop a rash. °· You have a fever. °Get help right away if: °· You have trouble breathing. °This information is not intended to replace advice given to you by your health care provider. Make sure you discuss any questions you have with your health care provider. °Document Released: 06/30/2013 Document Revised: 02/12/2016 Document Reviewed: 12/30/2015 °[employer] Interactive Patient Education © 2018 [employer] ° °

## 2018-06-08 NOTE — Brief Op Note (Signed)
BRIEF CARDIAC CATHETERIZATION NOTE  DATE: 06/08/2018 TIME: 8:33 AM  PATIENT:  Kurt Diaz  77 y.o. male  PRE-OPERATIVE DIAGNOSIS:  chest pain  POST-OPERATIVE DIAGNOSIS:  Two-vessel CAD  PROCEDURE:  Procedure(s): LEFT HEART CATH AND CORONARY ANGIOGRAPHY (N/A) INTRAVASCULAR PRESSURE WIRE/FFR STUDY (N/A)  SURGEON:  Surgeon(s) and Role:    Nelva Bush, MD - Primary  FINDINGS: 1. Significant 2-vessel CAD with 70% ostial LAD (FFR 0.77) and 80% mid RCA ISR. 2. Normal LVEF and LVEDP.  RECOMMENDATIONS: 1. Cardiac surgery consultation for CABG. 2. Increase isosorbide mononitrate to 30 mg daily. 3. Aggressive secondary prevention.  Nelva Bush, MD [facility] Pager: [phone]

## 2018-06-09 MED FILL — Verapamil HCl IV Soln 2.5 MG/ML: INTRAVENOUS | Qty: 2 | Status: AC

## 2018-06-15 ENCOUNTER — Institutional Professional Consult (permissible substitution) (INDEPENDENT_AMBULATORY_CARE_PROVIDER_SITE_OTHER): Payer: BLUE CROSS/BLUE SHIELD | Admitting: Surgery

## 2018-06-15 VITALS — BP 155/80 | HR 64 | Resp 20 | Ht 68.0 in | Wt 190.0 lb

## 2018-06-15 DIAGNOSIS — I2510 Atherosclerotic heart disease of native coronary artery without angina pectoris: Secondary | ICD-10-CM | POA: Diagnosis not present

## 2018-06-16 ENCOUNTER — Encounter: Payer: Self-pay | Admitting: Surgery

## 2018-06-16 NOTE — Progress Notes (Signed)
Cardiothoracic Surgery Consultation  PCP is Antony Contras, MD Referring Provider is End, [name], MD  Chief Complaint  Patient presents with  . Coronary Artery Disease    Surgical eval, Cardiac Cath 06/08/2018, ECHO     HPI:  The patient is a 77 year old gentleman with history of hypertension, hyperlipidemia, and coronary disease status post myocardial infarction in 01-12-2005 reportedly with sudden death and subsequent stenting of the LAD and right coronary arteries.  He had recurrent chest discomfort in 01-12-2017 and underwent repeat cardiac catheterization on 10/25/2016 showing an ostial 70% LAD stenosis with an FFR of 0.87.  The proximal LAD stent was widely patent.  There is moderate ostial to proximal RCA stenosis and severe distal RCA disease with a widely patent mid RCA stent.  Left circumflex was widely patent.  Left ventricular ejection fraction was 45 to 50% with mild mid anterior wall hypokinesis.  The patient was treated with aspirin and Brilinta for 30 days and then switch to aspirin and Plavix.  He had recurrent substernal chest pain getting on an airplane in April 2018 but did not get on the flight and came to the ER where he ruled out for myocardial infarction.  He was started on Protonix for possible reflux.  He had a nuclear stress test on 04/28/2017 which was a low risk study which showed a small defect in the apical lateral region that was reversible and was felt either be diaphragmatic attenuation or very small area of ischemia.  He was continued on medical therapy.  He is continued to have some chest pains radiating to his jaw.  These have not been relieved with nitroglycerin.  He had an exercise tolerance test on 04/06/2018 which was negative.  He had a nuclear stress test the following day which showed ejection fraction of 55 to 65%.  There were no ST segment changes during stress and no signs of ischemia.  He has continued to have atypical chest pain at rest.  He stays active during the  day and has no chest discomfort with exertion.  He denies any shortness of breath.  He has had no dizziness or syncope.  He has had no peripheral edema.  He had repeat cardiac catheterization on 06/08/2018 which showed a 70% ostial LAD stenosis with an FFR of 0.77.  There was 80% mid RCA in-stent restenosis.  There was 70% distal RCA stenosis.  Left ventricular ejection fraction and filling pressures were normal.  Past Medical History:  Diagnosis Date  . Chest pain   . Coronary artery disease   . Coronary syndrome, acute ([place]) 01/12/05  . Hyperlipidemia   . Hypertension   . Syncope and collapse     Past Surgical History:  Procedure Laterality Date  . CARDIAC CATHETERIZATION  10/13/2009   EF 65%  . CARDIAC CATHETERIZATION N/A 10/25/2016   Procedure: Left Heart Cath and Coronary Angiography;  Surgeon: Belva Crome, MD;  Location: [facility];  Service: Cardiovascular;  Laterality: N/A;  . CARDIAC CATHETERIZATION N/A 10/25/2016   Procedure: Coronary Stent Intervention;  Surgeon: Belva Crome, MD;  Location: [facility];  Service: Cardiovascular;  Laterality: N/A;  . CARDIAC CATHETERIZATION N/A 10/25/2016   Procedure: Intravascular Pressure Wire/FFR Study;  Surgeon: Belva Crome, MD;  Location: [facility];  Service: Cardiovascular;  Laterality: N/A;  . CARDIOVASCULAR STRESS TEST  09/29/2009   EF 70%  . CORONARY ANGIOPLASTY     STENTING OF HIS LAD, STENTING OF HIS RIGHT  CORONARY.  . INTRAVASCULAR PRESSURE WIRE/FFR STUDY N/A 06/08/2018   Procedure: INTRAVASCULAR PRESSURE WIRE/FFR STUDY;  Surgeon: Nelva Bush, MD;  Location: [facility];  Service: Cardiovascular;  Laterality: N/A;  . LEFT HEART CATH AND CORONARY ANGIOGRAPHY N/A 06/08/2018   Procedure: LEFT HEART CATH AND CORONARY ANGIOGRAPHY;  Surgeon: Nelva Bush, MD;  Location: [facility];  Service: Cardiovascular;  Laterality: N/A;  . US ECHOCARDIOGRAPHY  12/15/2009   EF 55-60%    Family History    Problem Relation Age of Onset  . Heart attack Mother   . Stroke Mother   . Lung cancer Father     Social History Social History   Tobacco Use  . Smoking status: Never Smoker  . Smokeless tobacco: Never Used  Substance Use Topics  . Alcohol use: No  . Drug use: No    Current Outpatient Medications  Medication Sig Dispense Refill  . aspirin EC 81 MG tablet Take 81 mg by mouth daily after supper.    Marland Kitchen atorvastatin (LIPITOR) 80 MG tablet Take 1 tablet (80 mg total) by mouth daily at 6 PM. 30 tablet 9  . ibuprofen (ADVIL,MOTRIN) 200 MG tablet Take 400 mg by mouth 2 (two) times daily as needed for headache or moderate pain.     . isosorbide mononitrate (IMDUR) 30 MG 24 hr tablet Take 1 tablet (30 mg total) by mouth daily. 90 tablet 3  . lisinopril (PRINIVIL,ZESTRIL) 20 MG tablet Take 1 tablet (20 mg total) by mouth 2 (two) times daily. (Patient taking differently: Take 40 mg by mouth daily. ) 60 tablet 11  . Multiple Vitamin (MULTIVITAMIN WITH MINERALS) TABS tablet Take 1 tablet by mouth daily after supper.     . nitroGLYCERIN (NITROSTAT) 0.4 MG SL tablet Place 1 tablet (0.4 mg total) under the tongue every 5 (five) minutes as needed for chest pain. 25 tablet 3   No current facility-administered medications for this visit.     No Known Allergies  Review of Systems  Constitutional: Negative for activity change and fatigue.  HENT: Negative.   Eyes:       Floaters   Respiratory: Negative for shortness of breath.   Cardiovascular: Positive for chest pain. Negative for leg swelling.  Gastrointestinal:       Reflux  Endocrine: Negative.   Genitourinary: Positive for frequency.       BPH  Musculoskeletal: Negative.   Allergic/Immunologic: Negative.   Neurological: Negative.   Hematological: Negative.   Psychiatric/Behavioral: Negative.     BP (!) 155/80   Pulse 64   Resp 20   Ht 5\' 8"  (1.727 m)   Wt 190 lb (86.2 kg)   SpO2 98% Comment: RA  BMI 28.89 kg/m   Physical  Exam  Constitutional: He is oriented to person, place, and time. He appears well-developed and well-nourished. No distress.  HENT:  Head: Normocephalic and atraumatic.  Mouth/Throat: Oropharynx is clear and moist.  Eyes: Pupils are equal, round, and reactive to light. EOM are normal.  Neck: Normal range of motion. Neck supple. No JVD present. No thyromegaly present.  Cardiovascular: Normal rate, regular rhythm and normal heart sounds.  No murmur heard. Pulmonary/Chest: Effort normal and breath sounds normal. No respiratory distress.  Abdominal: Soft. Bowel sounds are normal. He exhibits no distension.  Musculoskeletal: Normal range of motion. He exhibits no edema.  Lymphadenopathy:    He has no cervical adenopathy.  Neurological: He is alert and oriented to person, place, and time.  Skin:  Skin is warm and dry.  Psychiatric: He has a normal mood and affect.     Diagnostic Tests:  Study Highlights     The left ventricular ejection fraction is normal (55-65%).  Nuclear stress EF: 63%.  Blood pressure demonstrated a normal response to exercise.  There was no ST segment deviation noted during stress.  The study is normal.  This is a low risk study.   Normal stress nuclear study with no ischemia or infarction; EF 63 with normal wall motion.    Nuclear History and Indications   History and Indications Indication for Stress Test: Evaluation of extent and severity of coronary artery disease History: Hx MI; Hx Sudden Death2006-04-22; STENT LAD and RCA; ETT 04/06/2018; Cardiac Risk Factors: Family History - CAD, Hypertension, Lipids and Hx Prostate CA  Symptoms: Chest Pain with Bilateral Jaw pain and numbness.  Stress Findings   ECG Baseline ECG indicates sinus bradycardia. .  Stress Findings The patient exercised following the Bruce protocol.  The patient reported shortness of breath during the stress test. The patient experienced no angina during the stress test.   The test  was stopped because the patient complained of fatigue.   Blood pressure and heart rate demonstrated a normal response to exercise. Blood pressure demonstrated a normal response to exercise. Overall, the patient's exercise capacity was normal.   Recovery time: 5 minutes. The patient's response to exercise was adequate for diagnosis.  Response to Stress There was no ST segment deviation noted during stress.  Arrhythmias during stress: rare PVCs.  Arrhythmias during recovery: rare PVCs.  Arrhythmias were not significant.  ECG was interpretable and there was no significant change from baseline.  Stress Measurements   Baseline Vitals  Rest HR 53 bpm    Rest BP 117/65 mmHg    Exercise Time  Exercise duration (min) 9 min    Exercise duration (sec) 40 sec    Peak Stress Vitals  Peak HR 142 bpm    Peak BP 173/72 mmHg    Exercise Data  MPHR 144 bpm    Percent HR 98 %    RPE 19     Estimated workload 11.2 METS       Nuclear Stress Measurements   LV sys vol 32 mL    TID 0.99     LV dias vol 86 mL    SSS 1     SRS 1     SDS 0          Nuclear Stress Findings   Isotope administration Rest isotope was administered with an IV injection of 9.8 mCi Tc42m Tetrofosmin. Rest SPECT images were obtained approximately 45 minutes post tracer injection. Stress isotope was administered with an IV injection of 32.4 mCi Tc25m Tetrofosmin at peak exercise. Images were obtained approximately 30 minutes post injection. Stress SPECT images were obtained approximately 30 minutes post tracer injection.  Nuclear Study Quality Overall image quality is good.  Nuclear Measurements Study was gated.  Rest Perfusion Rest perfusion normal.  Stress Perfusion Stress perfusion normal.  Overall Study Impression Myocardial perfusion is normal. The study is normal. This is a low risk study. Overall left ventricular systolic function was normal. LV cavity size is normal. Nuclear stress EF: 63%. The left  ventricular ejection fraction is normal (55-65%). There is no prior study for comparison.  From: ACCF/SCAI/STS/AATS/AHA/ASNC/HFSA/SCCT 01/13/2011 Appropriate Use Criteria for Coronary Revascularization Focused Update  Wall Scoring   Score Index: 1.000 Percent Normal: 100.0%  The left ventricular wall motion is normal.          Resulted by:   Signed Date/Time  Phone Pager  Lelon Perla 04/07/2018 12:56 PM [phone]     Physicians   Panel Physicians Referring Physician Case Authorizing Physician  End, [name], MD (Primary)    Procedures   INTRAVASCULAR PRESSURE WIRE/FFR STUDY  LEFT HEART CATH AND CORONARY ANGIOGRAPHY  Conclusion   Conclusions: 1. Significant two-vessel coronary artery disease with 70% ostial LAD (FFR 0.77) stenosis, 80% mid RCA in-stent restenosis, and 70% distal RCA stenosis. 2. Normal left ventricular systolic function and filling pressure.  Recommendations: 1. Cardiac surgery consultation for CABG. 2. Increase isosorbide mononitrate to 30 mg daily. 3. Aggressive secondary prevention.  Recommend Aspirin 81mg  daily for multivessel CAD pending CABG evaluation.  Nelva Bush, MD [facility] Pager: [phone]   Indications   Atypical chest pain [R07.89 (ICD-10-CM)]  Coronary artery disease involving native coronary artery of native heart with angina pectoris ([place]) [I25.119 (ICD-10-CM)]  Procedural Details/Technique   Technical Details Indication: 77 y.o. year-old man with history of CAD (prior PCI's to LAD and RCA; most recently DES x 2 to RCA in 10/2016) and moderate to severe ostial LAD stenosis (FFR 0.87 in 10/2016), hypertension, and hyperlipidemia, presenting for evaluation of intermittent non-exertional chest pain.  GFR: 78 ml/min  Procedure: The risks, benefits, complications, treatment options, and expected outcomes were discussed with the patient. The patient and/or family concurred with the proposed plan, giving  informed consent. The patient was brought to the cath lab after IV hydration was begun and oral premedication was given. The patient was further sedated with Versed and Fentanyl. The right wrist was assessed with a modified Allens test which was normal. The right wrist was prepped and draped in a sterile fashion. 1% lidocaine was used for local anesthesia. Using the modified Seldinger access technique, a 54F slender Glidesheath was placed in the right radial artery. 3 mg Verapamil was given through the sheath. Heparin 4,500 units were administered.  Selective coronary angiography was performed using 42F JL3.5 and JR4 catheters to engage the left and right coronary arteries, respectively. Left heart catheterization was performed using a 42F pigtail catheter. Left ventriculogram was performed with a power injection of contrast.  The left coronary artery was engaged with a 54F [id number] guide catheter. A Comet wire was advanced to the distal LAD and resting Pd/Pa obtained (Pd/Pa 0.93). Intravenous infusion of adenosine 140 mcg/kg/min was administered and FFR obtained at maximal hyperemia (FFR 0.77). Final angiogram demonstrates stable appearance of the left coronary artery.  At the end of the procedure, the radial artery sheath was removed and a TR band applied to achieve patent hemostasis. There were no immediate complications. The patient was taken to the recovery area in stable condition.  Contrast used: 100 mL Isovue Fluoroscopy time: 7.0 min Radiation dose: 463 mGy   Estimated blood loss <50 mL.  During this procedure the patient was administered the following to achieve and maintain moderate conscious sedation: Versed 1 mg, Fentanyl 25 mcg, while the patient's heart rate, blood pressure, and oxygen saturation were continuously monitored. The period of conscious sedation was 53 minutes, of which I was present face-to-face 100% of this time.  Complications   Complications documented before study signed  (06/08/2018 11:45 AM EDT)    No complications were associated with this study.  Documented by Nelva Bush, MD - 06/08/2018 11:42 AM EDT    Coronary Findings   Diagnostic  Dominance:  Co-dominant  Left Main  Vessel is large.  Dist LM lesion 30% stenosed  Dist LM lesion is 30% stenosed. The lesion is eccentric.  Left Anterior Descending  Vessel is large.  Ost LAD lesion 70% stenosed  Ost LAD lesion is 70% stenosed. The lesion is focal. Pressure wire/FFR was performed on the lesion. FFR: 0.77.  Prox LAD to Mid LAD lesion 10% stenosed  Prox LAD to Mid LAD lesion is 10% stenosed. The lesion was previously treated.  Dist LAD lesion 80% stenosed  Dist LAD lesion is 80% stenosed.  First Diagonal Branch  Vessel is small in size.  Second Diagonal Branch  Vessel is moderate in size.  Ost 2nd Diag lesion 70% stenosed  Ost 2nd Diag lesion is 70% stenosed.  Third Diagonal Branch  Vessel is small in size.  Left Circumflex  Vessel is large.  Mid Cx to Dist Cx lesion 20% stenosed  Mid Cx to Dist Cx lesion is 20% stenosed.  First Obtuse Marginal Branch  Vessel is moderate in size.  1st Mrg lesion 50% stenosed  1st Mrg lesion is 50% stenosed.  Second Obtuse Marginal Branch  Vessel is small in size.  Third Obtuse Marginal Branch  Vessel is moderate in size.  Fourth Obtuse Marginal Branch  Vessel is moderate in size.  Left Posterior Descending Artery  Vessel is small in size.  Right Coronary Artery  Vessel is moderate in size.  Ost RCA to Prox RCA lesion 0% stenosed  Previously placed Ost RCA to Prox RCA stent (unknown type) is widely patent.  Prox RCA lesion 30% stenosed  Prox RCA lesion is 30% stenosed.  Prox RCA to Mid RCA lesion 0% stenosed  Previously placed Prox RCA to Mid RCA stent (unknown type) is widely patent.  Mid RCA lesion 80% stenosed  Mid RCA lesion is 80% stenosed. The lesion was previously treated using a drug eluting stent between 1-2 years ago. Previously placed  stent displays restenosis. Eccentric in-stent restenosis at proximal stent margin, including area of stent overlap.  Dist RCA lesion 70% stenosed  Dist RCA lesion is 70% stenosed.  Inferior Septal  Vessel is small in size.  Right Posterior Atrioventricular Branch  Vessel is small in size.  Intervention   No interventions have been documented.  Coronary Diagrams   Diagnostic Diagram       Implants    No implant documentation for this case.  MERGE Images   Show images for CARDIAC CATHETERIZATION   Link to Procedure Log   Procedure Log    Hemo Data    Most Recent Value  AO Systolic Pressure 102 mmHg  AO Diastolic Pressure 48 mmHg  AO Mean 76 mmHg  LV Systolic Pressure 725 mmHg  LV Diastolic Pressure 4 mmHg  LV EDP 13 mmHg  AOp Systolic Pressure 366 mmHg  AOp Diastolic Pressure 54 mmHg  AOp Mean Pressure 81 mmHg  LVp Systolic Pressure 440 mmHg  LVp Diastolic Pressure 6 mmHg  LVp EDP Pressure 13 mmHg  FFR    Time Date FFR Ratio  Fractional Flow Reserve (FFR) 8:21 AM 06/08/18 0.77  Prox LAD      Impression:  This 77 year old gentleman has significant two-vessel coronary artery disease status post prior stenting of the LAD and right coronary arteries following myocardial infarction and reported cardiac arrest in 2006.  His current cardiac catheterization shows a 70% ostial LAD stenosis before the stents with an FFR of 0.77 which has decreased from 0.87 on the prior catheterization  in February 2018.  He also has an 80% mid RCA in-stent restenosis may compromise a relatively small distal right coronary artery.  His symptoms are somewhat atypical in that they occur with rest but not with exertion.  He has had a negative exercise tolerance test and negative nuclear stress tests in the past couple months.  I think coronary bypass graft surgery would be beneficial given the degree of ostial LAD stenosis.  I think this would prevent some significant problems down the road if  that stenosis occluded.  His best long-term prognosis will be with a left internal mammary graft to the LAD.  I am not sure that his chest discomfort with rest is due to his coronary disease but it certainly could be.  I reviewed the catheterization films with the patient and his wife and discussed all the above.  I recommended that he consider coronary artery bypass graft surgery.  He would like to think about it further and may require a second opinion.  Plan:  The patient will think about coronary bypass graft surgery further and will contact us if he decides to proceed.  I spent 60 minutes performing this consultation and > 50% of this time was spent face to face counseling and coordinating the care of this patient's multivessel coronary artery disease.  Gaye Pollack, MD [facility] [phone]

## 2018-06-17 ENCOUNTER — Ambulatory Visit: Payer: BLUE CROSS/BLUE SHIELD | Admitting: Cardiology

## 2018-06-17 ENCOUNTER — Encounter: Payer: BLUE CROSS/BLUE SHIELD | Admitting: Surgery

## 2018-06-18 NOTE — Progress Notes (Signed)
Cardiology Office Note   Date:  06/19/2018   ID:  Kurt Diaz, DOB 1941/03/18, MRN [id number]  PCP:  Kurt Contras, MD  Cardiologist:  Dr. [name]     Chief Complaint  Patient presents with  . Hospitalization Follow-up      History of Present Illness: Kurt Diaz is a 77 y.o. male who presents for follow up after cardiac cath.   CAD History of MI in 12/27/04 sudden death,  when he had syncope in the [place] airport and was found to be pulseless and underwent CPR and then underwent emergency cardiac catheterization and had intervention with 2 vessel disease being found. He had angioplasty and stenting to his LAD and angioplasty and stenting of his right coronary artery. Nuclear stress test January 2011 showed a small area of reversible ischemia and as a result he underwent cardiac catheterization by Dr. Acie Fredrickson on 10/13/2009 which showed that the stents were widely patent. There was a proximal 70% stenosis in the diagonal which was too small for PCI. The patient was seen in the office on 11/25/12 as a work in complaining of exertional dyspnea and some intermittent chest discomfort and left jaw discomfort. For this reason we arranged for repeat cardiac catheterization which was done by Dr. [name] on 11/30/12 and it showed that his previous stents were widely patent and no new lesions were seen. Continued medical management was advised  Cardiac cath was done 10/25/16 with ostial 70% LAD with FFR 0.87, patent stent to prox LAD. Moderate ostial to proximal RCA and severe distal RCA disease. Widely patent mid RCA stent. The right coronary was treated with distal (2.25 mm x 18) and proximal (2.75 x 15) Onyx Resolute drug-eluting stents with reduction in stenosis from 85% and 75% to 0% and 0% respectively with TIMI grade 3 flow. Patent LCX. EF 45-50%.   Plan was for ASA and Brilinta for 30 days then ASA and Plavix. It was noted his LAD disease needed to be monitored.   On last visit  was having atypical resting right sided chest pain Saw [facility] and given Nexium with some benefit. Pain is not exertional but does radiate to jaw--nuc study 04/07/18 with EF 63%, no ischemia normal study.    Pain now continues on Lt side and cannot be recreated with movement   Cannot recreate with palpation.  No associated symptoms of nausea or SOB.  Usually is a one but intensifies to a 3.  He has not yet started Nexium or Carafate.  Does not occur after meals and does not feel like his usual GERD.  reviewed last cath and has 70% RCA and 70% LAD stenosis concern for increased stenosis.  I added imdur and arranged cardiac cath.  Pt had cardiac cath and recommended CABG.  2 vessel disease, with 70% ostial LAD with FFR 0.77 stenosis, 80% mRCA stenosis in stent, and 70% distal RCA stenosis.  Normal LV function  Has seen Dr. Cyndia Bent.   Pt wants a second opinion of minimally invasive CABG.  He has contacted Dr. Evelina Dun at [facility].  He will get a disc of the cath to take with him.    He continues with intermittent Lt lat chest pain.  imdur has not seemed to make a difference in amount of pain.  Again no associated symptoms.  He would like to know how soon he should have surgery.      Past Medical History:  Diagnosis Date  . Chest pain   . Coronary  artery disease   . Coronary syndrome, acute ([place]) 2006  . Hyperlipidemia   . Hypertension   . Syncope and collapse     Past Surgical History:  Procedure Laterality Date  . CARDIAC CATHETERIZATION  10/13/2009   EF 65%  . CARDIAC CATHETERIZATION N/A 10/25/2016   Procedure: Left Heart Cath and Coronary Angiography;  Surgeon: Belva Crome, MD;  Location: [place] [facility];  Service: Cardiovascular;  Laterality: N/A;  . CARDIAC CATHETERIZATION N/A 10/25/2016   Procedure: Coronary Stent Intervention;  Surgeon: Belva Crome, MD;  Location: [place] [facility];  Service: Cardiovascular;  Laterality: N/A;  . CARDIAC CATHETERIZATION N/A 10/25/2016   Procedure:  Intravascular Pressure Wire/FFR Study;  Surgeon: Belva Crome, MD;  Location: [place] [facility];  Service: Cardiovascular;  Laterality: N/A;  . CARDIOVASCULAR STRESS TEST  09/29/2009   EF 70%  . CORONARY ANGIOPLASTY     STENTING OF HIS LAD, STENTING OF HIS RIGHT CORONARY.  . INTRAVASCULAR PRESSURE WIRE/FFR STUDY N/A 06/08/2018   Procedure: INTRAVASCULAR PRESSURE WIRE/FFR STUDY;  Surgeon: Nelva Bush, MD;  Location: [place] [facility];  Service: Cardiovascular;  Laterality: N/A;  . LEFT HEART CATH AND CORONARY ANGIOGRAPHY N/A 06/08/2018   Procedure: LEFT HEART CATH AND CORONARY ANGIOGRAPHY;  Surgeon: Nelva Bush, MD;  Location: [place] [facility];  Service: Cardiovascular;  Laterality: N/A;  . US ECHOCARDIOGRAPHY  12/15/2009   EF 55-60%     Current Outpatient Medications  Medication Sig Dispense Refill  . aspirin EC 81 MG tablet Take 81 mg by mouth daily after supper.    Marland Kitchen atorvastatin (LIPITOR) 80 MG tablet Take 1 tablet (80 mg total) by mouth daily at 6 PM. 30 tablet 9  . ibuprofen (ADVIL,MOTRIN) 200 MG tablet Take 400 mg by mouth 2 (two) times daily as needed for headache or moderate pain.     . isosorbide mononitrate (IMDUR) 30 MG 24 hr tablet Take 1 tablet (30 mg total) by mouth daily. 90 tablet 3  . lisinopril (PRINIVIL,ZESTRIL) 20 MG tablet Take 1 tablet (20 mg total) by mouth 2 (two) times daily. 60 tablet 11  . Multiple Vitamin (MULTIVITAMIN WITH MINERALS) TABS tablet Take 1 tablet by mouth daily after supper.     . nitroGLYCERIN (NITROSTAT) 0.4 MG SL tablet Place 1 tablet (0.4 mg total) under the tongue every 5 (five) minutes as needed for chest pain. 25 tablet 3   No current facility-administered medications for this visit.     Allergies:   Patient has no known allergies.    Social History:  The patient  reports that he has never smoked. He has never used smokeless tobacco. He reports that he does not drink alcohol or use drugs.   Family History:  The patient's family  history includes Heart attack in his mother; Lung cancer in his father; Stroke in his mother.    ROS:  General:no colds or fevers, no weight changes Skin:no rashes or ulcers HEENT:no blurred vision, no congestion CV:see HPI PUL:see HPI GI:no diarrhea constipation or melena, no indigestion GU:no hematuria, no dysuria MS:no joint pain, no claudication Neuro:no syncope, no lightheadedness Endo:no diabetes, no thyroid disease  Wt Readings from Last 3 Encounters:  06/19/18 195 lb 12.8 oz (88.8 kg)  06/15/18 190 lb (86.2 kg)  06/08/18 190 lb (86.2 kg)     PHYSICAL EXAM: VS:  BP 128/62   Pulse (!) 59   Ht 5\' 8"  (1.727 m)   Wt 195 lb 12.8 oz (88.8  kg)   SpO2 97%   BMI 29.77 kg/m  , BMI Body mass index is 29.77 kg/m. General:Pleasant affect, NAD Skin:Warm and dry, brisk capillary refill HEENT:normocephalic, sclera clear, mucus membranes moist Neck:supple, no JVD, no bruits  Heart:S1S2 RRR without murmur, gallup, rub or click Lungs:clear without rales, rhonchi, or wheezes ZOX:WRUE, non tender, + BS, do not palpate liver spleen or masses Ext:no lower ext edema, 2+ pedal pulses, 2+ radial pulses Neuro:alert and oriented, MAE, follows commands, + facial symmetry    EKG:  EKG is NOT ordered today.   Recent Labs: 05/25/2018: Hemoglobin 15.1; Platelets 249 05/28/2018: BUN 18; Creatinine, Ser 0.94; Potassium 4.5; Sodium 140    Lipid Panel    Component Value Date/Time   CHOL 99 (L) 12/16/2016 0730   TRIG 75 12/16/2016 0730   HDL 34 (L) 12/16/2016 0730   CHOLHDL 2.9 12/16/2016 0730   CHOLHDL 4 12/30/2014 0741   VLDL 20.6 12/30/2014 0741   LDLCALC 50 12/16/2016 0730       Other studies Reviewed: Additional studies/ records that were reviewed today include: .  Cardiac cath 06/08/18 Conclusions: 1. Significant two-vessel coronary artery disease with 70% ostial LAD (FFR 0.77) stenosis, 80% mid RCA in-stent restenosis, and 70% distal RCA stenosis. 2. Normal left ventricular  systolic function and filling pressure.  Recommendations: 1. Cardiac surgery consultation for CABG. 2. Increase isosorbide mononitrate to 30 mg daily. 3. Aggressive secondary prevention.  Recommend Aspirin 81mg  daily for multivessel CAD pending CABG evaluation. Diagnostic Diagram       ASSESSMENT AND PLAN:  1. Chest pain, no improvement with imdur pain comes and goes and not severe. .    2. Progressive CAD with LAD and RCA stenosis.  FFR of LAD 0.77. CABG recommended.  He has sen Dr. Cyndia Bent, pt wanted to have second opinion. He is making arrangement to see Dr. Evelina Dun at [facility].  I reviewed with Dr. Saunders Revel who is in the office today and did the cath.  We both recommend not to wait for surgery.  We prefer he does not present with acute MI needing emergent procedure. Pt understands.     3.  HLD on lipitor 80 and last LDL was controlled, I do not have record, they are done through his work.  4.  LV dysfunction in past now improved on cath.   Will schedule follow up in 2 months with Dr. [name], he should have had CABG by then.  Pt will contact us on decision.       Current medicines are reviewed with the patient today.  The patient Has no concerns regarding medicines.  The following changes have been made:  See above Labs/ tests ordered today include:see above  Disposition:   FU:  see above  Signed, [name], NP  06/19/2018 12:01 PM    [facility] [place], [place], [place], [place] Phone: [phone]; Fax: [phone]

## 2018-06-19 ENCOUNTER — Ambulatory Visit (INDEPENDENT_AMBULATORY_CARE_PROVIDER_SITE_OTHER): Payer: BLUE CROSS/BLUE SHIELD | Admitting: Cardiology

## 2018-06-19 ENCOUNTER — Encounter: Payer: Self-pay | Admitting: Cardiology

## 2018-06-19 VITALS — BP 128/62 | HR 59 | Ht 68.0 in | Wt 195.8 lb

## 2018-06-19 DIAGNOSIS — I25110 Atherosclerotic heart disease of native coronary artery with unstable angina pectoris: Secondary | ICD-10-CM | POA: Diagnosis not present

## 2018-06-19 DIAGNOSIS — E7800 Pure hypercholesterolemia, unspecified: Secondary | ICD-10-CM | POA: Diagnosis not present

## 2018-06-19 DIAGNOSIS — R079 Chest pain, unspecified: Secondary | ICD-10-CM | POA: Diagnosis not present

## 2018-06-19 DIAGNOSIS — I2510 Atherosclerotic heart disease of native coronary artery without angina pectoris: Secondary | ICD-10-CM

## 2018-06-19 NOTE — Patient Instructions (Addendum)
Medication Instructions:  Your physician recommends that you continue on your current medications as directed. Please refer to the Current Medication list given to you today.   Labwork: None ordered  Testing/Procedures: None ordered  Follow-Up Your physician recommends that you schedule a follow-up appointment in: 2 MONTHS WITH DR. [name]    Any Other Special Instructions Will Be Listed Below (If Applicable).     If you need a refill on your cardiac medications before your next appointment, please call your pharmacy.

## 2018-06-22 ENCOUNTER — Telehealth: Payer: Self-pay | Admitting: Cardiovascular Disease

## 2018-06-22 MED ORDER — ISOSORBIDE MONONITRATE ER 30 MG PO TB24: 30.0000 mg | ORAL_TABLET | Freq: Every day | ORAL | 3 refills | Status: DC

## 2018-06-22 NOTE — Telephone Encounter (Signed)
New message   Pt c/o medication issue:  1. Name of Medication:isosorbide mononitrate (IMDUR) 30 MG 24 hr tablet  2. How are you currently taking this medication (dosage and times per day)? 1/2 daily once a day.   3. Are you having a reaction (difficulty breathing--STAT)? No   4. What is your medication issue? Per York Cerise at [facility] needs a new prescription needs to say 1 tablet daily once a day.

## 2018-06-25 ENCOUNTER — Other Ambulatory Visit: Payer: Self-pay

## 2018-06-25 DIAGNOSIS — I25119 Atherosclerotic heart disease of native coronary artery with unspecified angina pectoris: Secondary | ICD-10-CM

## 2018-06-25 DIAGNOSIS — Z23 Encounter for immunization: Secondary | ICD-10-CM | POA: Diagnosis not present

## 2018-06-25 DIAGNOSIS — Z8674 Personal history of sudden cardiac arrest: Secondary | ICD-10-CM | POA: Diagnosis not present

## 2018-06-25 DIAGNOSIS — I2583 Coronary atherosclerosis due to lipid rich plaque: Secondary | ICD-10-CM | POA: Diagnosis not present

## 2018-06-25 DIAGNOSIS — I2510 Atherosclerotic heart disease of native coronary artery without angina pectoris: Secondary | ICD-10-CM | POA: Diagnosis not present

## 2018-06-25 DIAGNOSIS — Z7982 Long term (current) use of aspirin: Secondary | ICD-10-CM | POA: Diagnosis not present

## 2018-06-30 ENCOUNTER — Ambulatory Visit (HOSPITAL_COMMUNITY)
Admission: RE | Admit: 2018-06-30 | Discharge: 2018-06-30 | Disposition: A | Discharge status: Home / Self Care | Payer: BLUE CROSS/BLUE SHIELD | Source: Ambulatory Visit | Attending: Surgery | Admitting: Surgery

## 2018-06-30 DIAGNOSIS — I25119 Atherosclerotic heart disease of native coronary artery with unspecified angina pectoris: Secondary | ICD-10-CM | POA: Insufficient documentation

## 2018-06-30 DIAGNOSIS — I6523 Occlusion and stenosis of bilateral carotid arteries: Secondary | ICD-10-CM | POA: Insufficient documentation

## 2018-06-30 LAB — PULMONARY FUNCTION TEST
DL/VA % pred: 75 %
DL/VA: 3.36 ml/min/mmHg/L
DLCO unc % pred: 64 %
DLCO unc: 19.18 ml/min/mmHg
FEF 25-75 Post: 4.21 L/sec
FEF 25-75 Pre: 3.74 L/sec
FEF2575-%Change-Post: 12 %
FEF2575-%Pred-Post: 217 %
FEF2575-%Pred-Pre: 193 %
FEV1-%Change-Post: 1 %
FEV1-%Pred-Post: 119 %
FEV1-%Pred-Pre: 118 %
FEV1-Post: 3.26 L
FEV1-Pre: 3.22 L
FEV1FVC-%Change-Post: 3 %
FEV1FVC-%Pred-Pre: 117 %
FEV6-%Change-Post: -2 %
FEV6-%Pred-Post: 104 %
FEV6-%Pred-Pre: 106 %
FEV6-Post: 3.7 L
FEV6-Pre: 3.8 L
FEV6FVC-%Pred-Post: 107 %
FEV6FVC-%Pred-Pre: 107 %
FVC-%Change-Post: -2 %
FVC-%Pred-Post: 96 %
FVC-%Pred-Pre: 99 %
FVC-Post: 3.7 L
FVC-Pre: 3.8 L
Post FEV1/FVC ratio: 88 %
Post FEV6/FVC ratio: 100 %
Pre FEV1/FVC ratio: 85 %
Pre FEV6/FVC Ratio: 100 %
RV % pred: 77 %
RV: 1.93 L
TLC % pred: 90 %
TLC: 6 L

## 2018-06-30 MED ORDER — ALBUTEROL SULFATE (2.5 MG/3ML) 0.083% IN NEBU
2.5000 mg | INHALATION_SOLUTION | Freq: Once | RESPIRATORY_TRACT | Status: AC
  Administered 2018-06-30: 2.5 mg via RESPIRATORY_TRACT

## 2018-06-30 NOTE — Pre-Procedure Instructions (Signed)
Kurt Diaz  06/30/2018      [id number] [address], [address] [place] [address] Phone: [phone] Fax: [phone]  [facility] [place], [place] [address] Phone: [phone] Fax: [phone]  [id number] [place], [place] [address] Phone: [phone] Fax: Kurt Diaz [place], [place] [address] AT [facility] [address] [place] [address] [place] [address] Phone: [phone] Fax: [phone]    Your procedure is scheduled on 07/03/2018.  Report to [facility] Admitting at [place].M.  Call this number if you have problems the morning of surgery:  [phone]   Remember:  Do not eat or drink after midnight.     Take these medicines the morning of surgery with A SIP OF WATER: Isosorbide mononitrate (Imdur) Nitroglycerin (Nitrostat) - if needed  7 days prior to surgery STOP taking any Aleve, Naproxen, Ibuprofen, Motrin, Advil, Goody's, BC's, all herbal medications, fish oil, and all vitamins  Follow your surgeon's instructions on when to stop Asprin.  If no instructions were given by your surgeon then you will need to call the office to get those instructions.      Do not wear jewelry.  Do not wear lotions, powders, or colognes, or deodorant.  Men may shave face and neck.  Do not bring valuables to the hospital.  [facility] is not responsible for any belongings or valuables.  Contacts, eyeglasses, hearing aids, dentures or bridgework may not be worn into surgery.  Leave your suitcase in the car.  After surgery it may be brought to your room.  For patients admitted to the hospital, discharge time will be determined by your treatment team.  Patients discharged the day of surgery will not be allowed to  drive home.   Name and phone number of your driver:    Special instructions:   [facility]- Preparing For Surgery  Before surgery, you can play an important role. Because skin is not sterile, your skin needs to be as free of germs as possible. You can reduce the number of germs on your skin by washing with CHG (chlorahexidine gluconate) Soap before surgery.  CHG is an antiseptic cleaner which kills germs and bonds with the skin to continue killing germs even after washing.    Oral Hygiene is also important to reduce your risk of infection.  Remember - BRUSH YOUR TEETH THE MORNING OF SURGERY WITH YOUR REGULAR TOOTHPASTE  Please do not use if you have an allergy to CHG or antibacterial soaps. If your skin becomes reddened/irritated stop using the CHG.  Do not shave (including legs and underarms) for at least 48 hours prior to first CHG shower. It is OK to shave your face.  Please follow these instructions carefully.   1. Shower the NIGHT BEFORE SURGERY and the MORNING OF SURGERY with CHG.   2. If you chose to wash your hair, wash your hair first as usual with your normal shampoo.  3. After you shampoo, rinse your hair and body thoroughly to remove the shampoo.  4. Use CHG as you would any other liquid soap. You can apply CHG directly to the skin and wash gently with a scrungie or a clean washcloth.   5. Apply the CHG  Soap to your body ONLY FROM THE NECK DOWN.  Do not use on open wounds or open sores. Avoid contact with your eyes, ears, mouth and genitals (private parts). Wash Face and genitals (private parts)  with your normal soap.  6. Wash thoroughly, paying special attention to the area where your surgery will be performed.  7. Thoroughly rinse your body with warm water from the neck down.  8. DO NOT shower/wash with your normal soap after using and rinsing off the CHG Soap.  9. Pat yourself dry with a CLEAN TOWEL.  10. Wear CLEAN PAJAMAS to bed the night before surgery, wear  comfortable clothes the morning of surgery  11. Place CLEAN SHEETS on your bed the night of your first shower and DO NOT SLEEP WITH PETS.    Day of Surgery: Shower as stated above. Do not apply any deodorants/lotions.  Please wear clean clothes to the hospital/surgery center.   Remember to brush your teeth WITH YOUR REGULAR TOOTHPASTE.    Please read over the following fact sheets that you were given.

## 2018-07-01 ENCOUNTER — Encounter: Payer: Self-pay | Admitting: Surgery

## 2018-07-01 ENCOUNTER — Ambulatory Visit (INDEPENDENT_AMBULATORY_CARE_PROVIDER_SITE_OTHER): Payer: BLUE CROSS/BLUE SHIELD | Admitting: Surgery

## 2018-07-01 ENCOUNTER — Ambulatory Visit (HOSPITAL_BASED_OUTPATIENT_CLINIC_OR_DEPARTMENT_OTHER)
Admission: RE | Admit: 2018-07-01 | Discharge: 2018-07-01 | Disposition: A | Discharge status: Home / Self Care | Payer: BLUE CROSS/BLUE SHIELD | Source: Ambulatory Visit | Attending: Surgery | Admitting: Surgery

## 2018-07-01 ENCOUNTER — Encounter (HOSPITAL_COMMUNITY)
Admission: RE | Admit: 2018-07-01 | Discharge: 2018-07-01 | Disposition: A | Discharge status: Home / Self Care | Payer: BLUE CROSS/BLUE SHIELD | Source: Ambulatory Visit | Attending: Surgery | Admitting: Surgery

## 2018-07-01 ENCOUNTER — Encounter (HOSPITAL_COMMUNITY): Payer: Self-pay

## 2018-07-01 ENCOUNTER — Other Ambulatory Visit: Payer: Self-pay

## 2018-07-01 VITALS — BP 129/69 | HR 62 | Resp 16 | Ht 68.0 in | Wt 195.0 lb

## 2018-07-01 DIAGNOSIS — I25119 Atherosclerotic heart disease of native coronary artery with unspecified angina pectoris: Secondary | ICD-10-CM | POA: Diagnosis not present

## 2018-07-01 DIAGNOSIS — I6523 Occlusion and stenosis of bilateral carotid arteries: Secondary | ICD-10-CM | POA: Diagnosis not present

## 2018-07-01 DIAGNOSIS — I2510 Atherosclerotic heart disease of native coronary artery without angina pectoris: Secondary | ICD-10-CM

## 2018-07-01 DIAGNOSIS — R918 Other nonspecific abnormal finding of lung field: Secondary | ICD-10-CM | POA: Diagnosis not present

## 2018-07-01 LAB — COMPREHENSIVE METABOLIC PANEL
ALT: 22 U/L (ref 0–44)
AST: 30 U/L (ref 15–41)
Albumin: 3.9 g/dL (ref 3.5–5.0)
Alkaline Phosphatase: 44 U/L (ref 38–126)
Anion gap: 8 (ref 5–15)
BUN: 16 mg/dL (ref 8–23)
CO2: 21 mmol/L — ABNORMAL LOW (ref 22–32)
Calcium: 9 mg/dL (ref 8.9–10.3)
Chloride: 109 mmol/L (ref 98–111)
Creatinine, Ser: 0.89 mg/dL (ref 0.61–1.24)
GFR calc Af Amer: >60 mL/min (ref >60)
GFR calc non Af Amer: >60 mL/min (ref >60)
Glucose, Bld: 103 mg/dL — ABNORMAL HIGH (ref 70–99)
Potassium: 4.9 mmol/L (ref 3.5–5.1)
Sodium: 138 mmol/L (ref 135–145)
Total Bilirubin: 1.4 mg/dL — ABNORMAL HIGH (ref 0.3–1.2)
Total Protein: 6.6 g/dL (ref 6.5–8.1)

## 2018-07-01 LAB — URINALYSIS, ROUTINE W REFLEX MICROSCOPIC
Bacteria, UA: NONE SEEN
Bilirubin Urine: NEGATIVE
Glucose, UA: NEGATIVE mg/dL
Hgb urine dipstick: NEGATIVE
Ketones, ur: NEGATIVE mg/dL
Leukocytes, UA: NEGATIVE
Nitrite: NEGATIVE
Protein, ur: NEGATIVE mg/dL
Specific Gravity, Urine: 1.016 (ref 1.005–1.030)
pH: 6 (ref 5.0–8.0)

## 2018-07-01 LAB — CBC
HCT: 44.8 % (ref 39.0–52.0)
Hemoglobin: 14.6 g/dL (ref 13.0–17.0)
MCH: 29.9 pg (ref 26.0–34.0)
MCHC: 32.6 g/dL (ref 30.0–36.0)
MCV: 91.8 fL (ref 80.0–100.0)
Platelets: 233 10*3/uL (ref 150–400)
RBC: 4.88 MIL/uL (ref 4.22–5.81)
RDW: 13.7 % (ref 11.5–15.5)
WBC: 7.3 10*3/uL (ref 4.0–10.5)
nRBC: 0 % (ref 0.0–0.2)

## 2018-07-01 LAB — PROTIME-INR
INR: 1
Prothrombin Time: 13.1 seconds (ref 11.4–15.2)

## 2018-07-01 LAB — BLOOD GAS, ARTERIAL
Acid-Base Excess: 0.8 mmol/L (ref 0.0–2.0)
Bicarbonate: 24.7 mmol/L (ref 20.0–28.0)
Drawn by: 470591
FIO2: 21
O2 Saturation: 98.3 %
Patient temperature: 98.6
pCO2 arterial: 37.9 mmHg (ref 32.0–48.0)
pH, Arterial: 7.429 (ref 7.350–7.450)
pO2, Arterial: 109 mmHg — ABNORMAL HIGH (ref 83.0–108.0)

## 2018-07-01 LAB — SURGICAL PCR SCREEN
MRSA, PCR: NEGATIVE
Staphylococcus aureus: NEGATIVE

## 2018-07-01 LAB — TYPE AND SCREEN
ABO/RH(D): A POS
Antibody Screen: NEGATIVE

## 2018-07-01 LAB — ABO/RH: ABO/RH(D): A POS

## 2018-07-01 LAB — APTT: aPTT: 38 seconds — ABNORMAL HIGH (ref 24–36)

## 2018-07-01 LAB — HEMOGLOBIN A1C
Hgb A1c MFr Bld: 6.2 % — ABNORMAL HIGH (ref 4.8–5.6)
Mean Plasma Glucose: 131.24 mg/dL

## 2018-07-01 NOTE — Progress Notes (Signed)
Pre-op Cardiac Surgery  Carotid Findings:   Bilateral ICAs 1-39% stenosis. Bilateral vertebral arteries patent with antegrade flow.  Upper Extremity Right Left  Brachial Pressures 132 131  Radial Waveforms Biphasic Biphasic  Ulnar Waveforms Monophasic Biphasic  Palmar Arch (Allen's Test) See below See below   Findings:   Right Upper Extremity: Doppler waveforms remain within normal limits with right radial compression. Doppler waveforms decrease >50% with right ulnar compression.  Left Upper Extremity: Doppler waveforms remain within normal limits with left radial compression. Doppler waveforms decrease >50% with left ulnar compression.   Lower  Extremity Right Left  Dorsalis Pedis Triphasic 136 Biphasic 148  Posterior Tibial Triphasic  135 Triphasic 168  Ankle/Brachial Indices 1.03 1.27   Findings:  Resting bilateral ankle-brachial indecis are within normal range. No evidence of significant bilateral  lower extremities arterial disease.  Kurt Diaz (RDMS RVT) 07/01/18 11:05 AM

## 2018-07-01 NOTE — Progress Notes (Signed)
PCP - Dr. Moreen Fowler Cardiologist - Dr. [name]  Chest x-ray - 07/01/2018 EKG - 06/09/2018 Stress Test - 04/07/2018 ECHO - 2011 Cardiac Cath - 06/08/2018  Sleep Study - patient denies  Blood Thinner Instructions: Aspirin Instructions: patient to contact Dr. Vivi Martens office for instructions  Anesthesia review: yes, cardiac history  Patient denies shortness of breath, fever, cough and chest pain at PAT appointment   Patient verbalized understanding of instructions that were given to them at the PAT appointment. Patient was also instructed that they will need to review over the PAT instructions again at home before surgery.

## 2018-07-01 NOTE — Progress Notes (Signed)
     HPI:  The patient returns today to go over some further questions that he has about coronary bypass graft surgery which is scheduled for Friday this week.  He is here with his wife.  He continues to have some intermittent left-sided chest discomfort.  He said that he went for a second opinion at [facility] and was told that he needed coronary bypass surgery.  Current Outpatient Medications  Medication Sig Dispense Refill  . aspirin EC 81 MG tablet Take 81 mg by mouth daily after supper.    Marland Kitchen atorvastatin (LIPITOR) 80 MG tablet Take 1 tablet (80 mg total) by mouth daily at 6 PM. 30 tablet 9  . ibuprofen (ADVIL,MOTRIN) 200 MG tablet Take 400 mg by mouth 2 (two) times daily as needed for headache or moderate pain.     . isosorbide mononitrate (IMDUR) 30 MG 24 hr tablet Take 1 tablet (30 mg total) by mouth daily. 90 tablet 3  . lisinopril (PRINIVIL,ZESTRIL) 20 MG tablet Take 1 tablet (20 mg total) by mouth 2 (two) times daily. 60 tablet 11  . Multiple Vitamin (MULTIVITAMIN WITH MINERALS) TABS tablet Take 1 tablet by mouth daily after supper.     . nitroGLYCERIN (NITROSTAT) 0.4 MG SL tablet Place 1 tablet (0.4 mg total) under the tongue every 5 (five) minutes as needed for chest pain. 25 tablet 3   No current facility-administered medications for this visit.      Physical Exam: BP 129/69 (BP Location: Right Arm, Patient Position: Sitting, Cuff Size: Large)   Pulse 62   Resp 16   Ht 5\' 8"  (1.727 m)   Wt 195 lb (88.5 kg)   SpO2 95% Comment: ON RA  BMI 29.65 kg/m     Impression:  He is stable for coronary artery bypass graft surgery on Friday.  I reviewed the surgical procedure again with him and his wife and answered all their questions.  Plan:  Coronary bypass graft surgery to the LAD and RCA on Friday, 07/03/2018.   Gaye Pollack, MD [facility] [phone]

## 2018-07-02 MED ORDER — DOPAMINE-DEXTROSE 3.2-5 MG/ML-% IV SOLN
0.0000 ug/kg/min | INTRAVENOUS | Status: DC
  Filled 2018-07-02: qty 250

## 2018-07-02 MED ORDER — MILRINONE LACTATE IN DEXTROSE 20-5 MG/100ML-% IV SOLN
0.3000 ug/kg/min | INTRAVENOUS | Status: DC
  Filled 2018-07-02: qty 100

## 2018-07-02 MED ORDER — MAGNESIUM SULFATE 50 % IJ SOLN
40.0000 meq | INTRAMUSCULAR | Status: DC
  Filled 2018-07-02: qty 9.85

## 2018-07-02 MED ORDER — TRANEXAMIC ACID (OHS) PUMP PRIME SOLUTION
2.0000 mg/kg | INTRAVENOUS | Status: DC
  Filled 2018-07-02: qty 1.78

## 2018-07-02 MED ORDER — SODIUM CHLORIDE 0.9 % IV SOLN
1.5000 g | INTRAVENOUS | Status: AC
  Administered 2018-07-03: 1.5 g via INTRAVENOUS
  Filled 2018-07-02: qty 1.5

## 2018-07-02 MED ORDER — PHENYLEPHRINE HCL-NACL 20-0.9 MG/250ML-% IV SOLN
30.0000 ug/min | INTRAVENOUS | Status: AC
  Administered 2018-07-03: 10 ug/min via INTRAVENOUS
  Filled 2018-07-02: qty 250

## 2018-07-02 MED ORDER — SODIUM CHLORIDE 0.9 % IV SOLN
INTRAVENOUS | Status: DC
  Filled 2018-07-02: qty 30

## 2018-07-02 MED ORDER — INSULIN REGULAR(HUMAN) IN NACL 100-0.9 UT/100ML-% IV SOLN
INTRAVENOUS | Status: AC
  Administered 2018-07-03: .8 [IU]/h via INTRAVENOUS
  Filled 2018-07-02: qty 100

## 2018-07-02 MED ORDER — TRANEXAMIC ACID 1000 MG/10ML IV SOLN
1.5000 mg/kg/h | INTRAVENOUS | Status: AC
  Administered 2018-07-03: 1.5 mg/kg/h via INTRAVENOUS
  Filled 2018-07-02: qty 25

## 2018-07-02 MED ORDER — TRANEXAMIC ACID (OHS) BOLUS VIA INFUSION
15.0000 mg/kg | INTRAVENOUS | Status: AC
  Administered 2018-07-03: 1332 mg via INTRAVENOUS
  Filled 2018-07-02: qty 1332

## 2018-07-02 MED ORDER — NOREPINEPHRINE 4 MG/250ML-% IV SOLN
0.0000 ug/min | INTRAVENOUS | Status: DC
  Filled 2018-07-02: qty 250

## 2018-07-02 MED ORDER — POTASSIUM CHLORIDE 2 MEQ/ML IV SOLN
80.0000 meq | INTRAVENOUS | Status: DC
  Filled 2018-07-02: qty 40

## 2018-07-02 MED ORDER — NITROGLYCERIN IN D5W 200-5 MCG/ML-% IV SOLN
2.0000 ug/min | INTRAVENOUS | Status: DC
  Filled 2018-07-02: qty 250

## 2018-07-02 MED ORDER — VANCOMYCIN HCL 10 G IV SOLR
1500.0000 mg | INTRAVENOUS | Status: AC
  Administered 2018-07-03: 1500 mg via INTRAVENOUS
  Filled 2018-07-02: qty 1500

## 2018-07-02 MED ORDER — EPINEPHRINE PF 1 MG/ML IJ SOLN
0.0000 ug/min | INTRAVENOUS | Status: DC
  Filled 2018-07-02: qty 4

## 2018-07-02 MED ORDER — SODIUM CHLORIDE 0.9 % IV SOLN
750.0000 mg | INTRAVENOUS | Status: DC
  Filled 2018-07-02: qty 750

## 2018-07-02 MED ORDER — DEXMEDETOMIDINE HCL IN NACL 400 MCG/100ML IV SOLN
0.1000 ug/kg/h | INTRAVENOUS | Status: AC
  Administered 2018-07-03: .5 ug/kg/h via INTRAVENOUS
  Filled 2018-07-02: qty 100

## 2018-07-02 MED ORDER — PLASMA-LYTE 148 IV SOLN
INTRAVENOUS | Status: AC
  Administered 2018-07-03: 500 mL
  Filled 2018-07-02: qty 2.5

## 2018-07-02 NOTE — H&P (Signed)
[address]       [place] [address]             [phone]      Cardiothoracic Surgery Admission History and Physical   PCP is Antony Contras, MD Referring Provider is End, [name], MD      Chief Complaint  Patient presents with  . Coronary Artery Disease        HPI:  The patient is a 77 year old gentleman with history of hypertension, hyperlipidemia, and coronary disease status post myocardial infarction in 12/29/2004 reportedly with sudden death and subsequent stenting of the LAD and right coronary arteries.  He had recurrent chest discomfort in 12/29/2016 and underwent repeat cardiac catheterization on 10/25/2016 showing an ostial 70% LAD stenosis with an FFR of 0.87.  The proximal LAD stent was widely patent.  There is moderate ostial to proximal RCA stenosis and severe distal RCA disease with a widely patent mid RCA stent.  Left circumflex was widely patent.  Left ventricular ejection fraction was 45 to 50% with mild mid anterior wall hypokinesis.  The patient was treated with aspirin and Brilinta for 30 days and then switch to aspirin and Plavix.  He had recurrent substernal chest pain getting on an airplane in April 2018 but did not get on the flight and came to the ER where he ruled out for myocardial infarction.  He was started on Protonix for possible reflux.  He had a nuclear stress test on 04/28/2017 which was a low risk study which showed a small defect in the apical lateral region that was reversible and was felt either be diaphragmatic attenuation or very small area of ischemia.  He was continued on medical therapy.  He is continued to have some chest pains radiating to his jaw.  These have not been relieved with nitroglycerin.  He had an exercise tolerance test on 04/06/2018 which was negative.  He had a nuclear stress test the following day which showed ejection fraction of 55 to 65%.  There were no ST segment changes during stress and no signs of ischemia.   He has continued to have atypical chest pain at rest.  He stays active during the day and has no chest discomfort with exertion.  He denies any shortness of breath.  He has had no dizziness or syncope.  He has had no peripheral edema.  He had repeat cardiac catheterization on 06/08/2018 which showed a 70% ostial LAD stenosis with an FFR of 0.77.  There was 80% mid RCA in-stent restenosis.  There was 70% distal RCA stenosis.  Left ventricular ejection fraction and filling pressures were normal.      Past Medical History:  Diagnosis Date  . Chest pain   . Coronary artery disease   . Coronary syndrome, acute ([place]) 12-29-2004  . Hyperlipidemia   . Hypertension   . Syncope and collapse          Past Surgical History:  Procedure Laterality Date  . CARDIAC CATHETERIZATION  10/13/2009   EF 65%  . CARDIAC CATHETERIZATION N/A 10/25/2016   Procedure: Left Heart Cath and Coronary Angiography;  Surgeon: Belva Crome, MD;  Location: [facility];  Service: Cardiovascular;  Laterality: N/A;  . CARDIAC CATHETERIZATION N/A 10/25/2016   Procedure: Coronary Stent Intervention;  Surgeon: Belva Crome, MD;  Location: [facility];  Service: Cardiovascular;  Laterality: N/A;  . CARDIAC CATHETERIZATION N/A 10/25/2016   Procedure: Intravascular Pressure Wire/FFR Study;  Surgeon: Belva Crome, MD;  Location: [facility];  Service: Cardiovascular;  Laterality: N/A;  . CARDIOVASCULAR STRESS TEST  09/29/2009   EF 70%  . CORONARY ANGIOPLASTY     STENTING OF HIS LAD, STENTING OF HIS RIGHT CORONARY.  . INTRAVASCULAR PRESSURE WIRE/FFR STUDY N/A 06/08/2018   Procedure: INTRAVASCULAR PRESSURE WIRE/FFR STUDY;  Surgeon: Nelva Bush, MD;  Location: [facility];  Service: Cardiovascular;  Laterality: N/A;  . LEFT HEART CATH AND CORONARY ANGIOGRAPHY N/A 06/08/2018   Procedure: LEFT HEART CATH AND CORONARY ANGIOGRAPHY;  Surgeon: Nelva Bush, MD;  Location: [facility];   Service: Cardiovascular;  Laterality: N/A;  . US ECHOCARDIOGRAPHY  12/15/2009   EF 55-60%         Family History  Problem Relation Age of Onset  . Heart attack Mother   . Stroke Mother   . Lung cancer Father     Social History Social History       Tobacco Use  . Smoking status: Never Smoker  . Smokeless tobacco: Never Used  Substance Use Topics  . Alcohol use: No  . Drug use: No          Current Outpatient Medications  Medication Sig Dispense Refill  . aspirin EC 81 MG tablet Take 81 mg by mouth daily after supper.    Marland Kitchen atorvastatin (LIPITOR) 80 MG tablet Take 1 tablet (80 mg total) by mouth daily at 6 PM. 30 tablet 9  . ibuprofen (ADVIL,MOTRIN) 200 MG tablet Take 400 mg by mouth 2 (two) times daily as needed for headache or moderate pain.     . isosorbide mononitrate (IMDUR) 30 MG 24 hr tablet Take 1 tablet (30 mg total) by mouth daily. 90 tablet 3  . lisinopril (PRINIVIL,ZESTRIL) 20 MG tablet Take 1 tablet (20 mg total) by mouth 2 (two) times daily. (Patient taking differently: Take 40 mg by mouth daily. ) 60 tablet 11  . Multiple Vitamin (MULTIVITAMIN WITH MINERALS) TABS tablet Take 1 tablet by mouth daily after supper.     . nitroGLYCERIN (NITROSTAT) 0.4 MG SL tablet Place 1 tablet (0.4 mg total) under the tongue every 5 (five) minutes as needed for chest pain. 25 tablet 3   No current facility-administered medications for this visit.     No Known Allergies  Review of Systems  Constitutional: Negative for activity change and fatigue.  HENT: Negative.   Eyes:       Floaters   Respiratory: Negative for shortness of breath.   Cardiovascular: Positive for chest pain. Negative for leg swelling.  Gastrointestinal:       Reflux  Endocrine: Negative.   Genitourinary: Positive for frequency.       BPH  Musculoskeletal: Negative.   Allergic/Immunologic: Negative.   Neurological: Negative.   Hematological: Negative.   Psychiatric/Behavioral:  Negative.     BP (!) 155/80   Pulse 64   Resp 20   Ht 5\' 8"  (1.727 m)   Wt 190 lb (86.2 kg)   SpO2 98% Comment: RA  BMI 28.89 kg/m   Physical Exam  Constitutional: He is oriented to person, place, and time. He appears well-developed and well-nourished. No distress.  HENT:  Head: Normocephalic and atraumatic.  Mouth/Throat: Oropharynx is clear and moist.  Eyes: Pupils are equal, round, and reactive to light. EOM are normal.  Neck: Normal range of motion. Neck supple. No JVD present. No thyromegaly present.  Cardiovascular: Normal rate, regular rhythm and normal heart sounds.  No murmur heard. Pulmonary/Chest: Effort normal and breath sounds normal. No respiratory distress.  Abdominal: Soft. Bowel sounds are normal. He exhibits no distension.  Musculoskeletal: Normal range of motion. He exhibits no edema.  Lymphadenopathy:    He has no cervical adenopathy.  Neurological: He is alert and oriented to person, place, and time.  Skin: Skin is warm and dry.  Psychiatric: He has a normal mood and affect.     Diagnostic Tests:  Study Highlights     The left ventricular ejection fraction is normal (55-65%).  Nuclear stress EF: 63%.  Blood pressure demonstrated a normal response to exercise.  There was no ST segment deviation noted during stress.  The study is normal.  This is a low risk study.  Normal stress nuclear study with no ischemia or infarction; EF 63 with normal wall motion.    Nuclear History and Indications   History and Indications Indication for Stress Test: Evaluation of extent and severity of coronary artery disease History: Hx MI; Hx Sudden DeathApr 24, 2006; STENT LAD and RCA; ETT 04/06/2018; Cardiac Risk Factors: Family History - CAD, Hypertension, Lipids and Hx Prostate CA  Symptoms: Chest Pain with Bilateral Jaw pain and numbness.  Stress Findings   ECG Baseline ECG indicates sinus bradycardia. .  Stress Findings The patient exercised  following the Bruce protocol.  The patient reported shortness of breath during the stress test. The patient experienced no angina during the stress test.   The test was stopped because the patient complained of fatigue.   Blood pressure and heart rate demonstrated a normal response to exercise. Blood pressure demonstrated a normal response to exercise. Overall, the patient's exercise capacity was normal.   Recovery time: 5 minutes. The patient's response to exercise was adequate for diagnosis.  Response to Stress There was no ST segment deviation noted during stress.  Arrhythmias during stress: rare PVCs.  Arrhythmias during recovery: rare PVCs.  Arrhythmias were not significant.  ECG was interpretable and there was no significant change from baseline.  Stress Measurements      Baseline Vitals  Rest HR 53 bpm    Rest BP 117/65 mmHg    Exercise Time  Exercise duration (min) 9 min    Exercise duration (sec) 40 sec    Peak Stress Vitals  Peak HR 142 bpm    Peak BP 173/72 mmHg    Exercise Data  MPHR 144 bpm    Percent HR 98 %    RPE 19     Estimated workload 11.2 METS       Nuclear Stress Measurements   LV sys vol 32 mL    TID 0.99     LV dias vol 86 mL    SSS 1     SRS 1     SDS 0          Nuclear Stress Findings   Isotope administration Rest isotope was administered with an IV injection of 9.8 mCi Tc38m Tetrofosmin. Rest SPECT images were obtained approximately 45 minutes post tracer injection. Stress isotope was administered with an IV injection of 32.4 mCi Tc57m Tetrofosmin at peak exercise. Images were obtained approximately 30 minutes post injection. Stress SPECT images were obtained approximately 30 minutes post tracer injection.  Nuclear Study Quality Overall image quality is good.  Nuclear Measurements Study was gated.  Rest Perfusion Rest perfusion normal.  Stress Perfusion Stress perfusion normal.  Overall Study Impression Myocardial  perfusion is normal. The study is normal. This is a low  risk study. Overall left ventricular systolic function was normal. LV cavity size is normal. Nuclear stress EF: 63%. The left ventricular ejection fraction is normal (55-65%). There is no prior study for comparison.  From: ACCF/SCAI/STS/AATS/AHA/ASNC/HFSA/SCCT 2012 Appropriate Use Criteria for Coronary Revascularization Focused Update  Wall Scoring                Score Index: 1.000 Percent Normal: 100.0%             The left ventricular wall motion is normal.               Resulted by:   Signed Date/Time  Phone Pager  Lelon Perla 04/07/2018 12:56 PM [phone]     Physicians   Panel Physicians Referring Physician Case Authorizing Physician  End, [name], MD (Primary)    Procedures   INTRAVASCULAR PRESSURE WIRE/FFR STUDY  LEFT HEART CATH AND CORONARY ANGIOGRAPHY  Conclusion   Conclusions: 1. Significant two-vessel coronary artery disease with 70% ostial LAD (FFR 0.77) stenosis, 80% mid RCA in-stent restenosis, and 70% distal RCA stenosis. 2. Normal left ventricular systolic function and filling pressure.  Recommendations: 1. Cardiac surgery consultation for CABG. 2. Increase isosorbide mononitrate to 30 mg daily. 3. Aggressive secondary prevention.  Recommend Aspirin 81mg  daily for multivessel CAD pending CABG evaluation.  Nelva Bush, MD [facility] Pager: [phone]   Indications   Atypical chest pain [R07.89 (ICD-10-CM)]  Coronary artery disease involving native coronary artery of native heart with angina pectoris ([place]) [I25.119 (ICD-10-CM)]  Procedural Details/Technique   Technical Details Indication: 77 y.o. year-old man with history of CAD (prior PCI's to LAD and RCA; most recently DES x 2 to RCA in 10/2016) and moderate to severe ostial LAD stenosis (FFR 0.87 in 10/2016), hypertension, and hyperlipidemia, presenting for evaluation of intermittent non-exertional  chest pain.  GFR: 78 ml/min  Procedure: The risks, benefits, complications, treatment options, and expected outcomes were discussed with the patient. The patient and/or family concurred with the proposed plan, giving informed consent. The patient was brought to the cath lab after IV hydration was begun and oral premedication was given. The patient was further sedated with Versed and Fentanyl. The right wrist was assessed with a modified Allens test which was normal. The right wrist was prepped and draped in a sterile fashion. 1% lidocaine was used for local anesthesia. Using the modified Seldinger access technique, a 744F slender Glidesheath was placed in the right radial artery. 3 mg Verapamil was given through the sheath. Heparin 4,500 units were administered.  Selective coronary angiography was performed using 44F JL3.5 and JR4 catheters to engage the left and right coronary arteries, respectively. Left heart catheterization was performed using a 44F pigtail catheter. Left ventriculogram was performed with a power injection of contrast.  The left coronary artery was engaged with a 744F XBLAD3 guide catheter. A Comet wire was advanced to the distal LAD and resting Pd/Pa obtained (Pd/Pa 0.93). Intravenous infusion of adenosine 140 mcg/kg/min was administered and FFR obtained at maximal hyperemia (FFR 0.77). Final angiogram demonstrates stable appearance of the left coronary artery.  At the end of the procedure, the radial artery sheath was removed and a TR band applied to achieve patent hemostasis. There were no immediate complications. The patient was taken to the recovery area in stable condition.  Contrast used: 100 mL Isovue Fluoroscopy time: 7.0 min Radiation dose: 463 mGy   Estimated blood loss <50 mL.  During this procedure the patient was administered the following to achieve and  maintain moderate conscious sedation: Versed 1 mg, Fentanyl 25 mcg, while the patient's heart rate, blood pressure,  and oxygen saturation were continuously monitored. The period of conscious sedation was 53 minutes, of which I was present face-to-face 100% of this time.  Complications   Complications documented before study signed (06/08/2018 11:45 AM EDT)    No complications were associated with this study.  Documented by Nelva Bush, MD - 06/08/2018 11:42 AM EDT    Coronary Findings   Diagnostic  Dominance: Co-dominant  Left Main  Vessel is large.  Dist LM lesion 30% stenosed  Dist LM lesion is 30% stenosed. The lesion is eccentric.  Left Anterior Descending  Vessel is large.  Ost LAD lesion 70% stenosed  Ost LAD lesion is 70% stenosed. The lesion is focal. Pressure wire/FFR was performed on the lesion. FFR: 0.77.  Prox LAD to Mid LAD lesion 10% stenosed  Prox LAD to Mid LAD lesion is 10% stenosed. The lesion was previously treated.  Dist LAD lesion 80% stenosed  Dist LAD lesion is 80% stenosed.  First Diagonal Branch  Vessel is small in size.  Second Diagonal Branch  Vessel is moderate in size.  Ost 2nd Diag lesion 70% stenosed  Ost 2nd Diag lesion is 70% stenosed.  Third Diagonal Branch  Vessel is small in size.  Left Circumflex  Vessel is large.  Mid Cx to Dist Cx lesion 20% stenosed  Mid Cx to Dist Cx lesion is 20% stenosed.  First Obtuse Marginal Branch  Vessel is moderate in size.  1st Mrg lesion 50% stenosed  1st Mrg lesion is 50% stenosed.  Second Obtuse Marginal Branch  Vessel is small in size.  Third Obtuse Marginal Branch  Vessel is moderate in size.  Fourth Obtuse Marginal Branch  Vessel is moderate in size.  Left Posterior Descending Artery  Vessel is small in size.  Right Coronary Artery  Vessel is moderate in size.  Ost RCA to Prox RCA lesion 0% stenosed  Previously placed Ost RCA to Prox RCA stent (unknown type) is widely patent.  Prox RCA lesion 30% stenosed  Prox RCA lesion is 30% stenosed.  Prox RCA to Mid RCA lesion 0% stenosed  Previously placed  Prox RCA to Mid RCA stent (unknown type) is widely patent.  Mid RCA lesion 80% stenosed  Mid RCA lesion is 80% stenosed. The lesion was previously treated using a drug eluting stent between 1-2 years ago. Previously placed stent displays restenosis. Eccentric in-stent restenosis at proximal stent margin, including area of stent overlap.  Dist RCA lesion 70% stenosed  Dist RCA lesion is 70% stenosed.  Inferior Septal  Vessel is small in size.  Right Posterior Atrioventricular Branch  Vessel is small in size.  Intervention   No interventions have been documented.  Coronary Diagrams   Diagnostic Diagram       Implants       No implant documentation for this case.  MERGE Images   Show images for CARDIAC CATHETERIZATION   Link to Procedure Log   Procedure Log    Hemo Data    Most Recent Value  AO Systolic Pressure 782 mmHg  AO Diastolic Pressure 48 mmHg  AO Mean 76 mmHg  LV Systolic Pressure 956 mmHg  LV Diastolic Pressure 4 mmHg  LV EDP 13 mmHg  AOp Systolic Pressure 213 mmHg  AOp Diastolic Pressure 54 mmHg  AOp Mean Pressure 81 mmHg  LVp Systolic Pressure 086 mmHg  LVp Diastolic Pressure 6 mmHg  LVp  EDP Pressure 13 mmHg  FFR    Time Date FFR Ratio  Fractional Flow Reserve (FFR) 8:21 AM 06/08/18 0.77  Prox LAD      Impression:  This 77 year old gentleman has significant two-vessel coronary artery disease status post prior stenting of the LAD and right coronary arteries following myocardial infarction and reported cardiac arrest in 2006.  His current cardiac catheterization shows a 70% ostial LAD stenosis before the stents with an FFR of 0.77 which has decreased from 0.87 on the prior catheterization in February 2018.  He also has an 80% mid RCA in-stent restenosis may compromise a relatively small distal right coronary artery.  His symptoms are somewhat atypical in that they occur with rest but not with exertion.  He has had a negative exercise tolerance  test and negative nuclear stress tests in the past couple months.  I think coronary bypass graft surgery would be beneficial given the degree of ostial LAD stenosis.  I think this would prevent some significant problems down the road if that stenosis occluded.  His best long-term prognosis will be with a left internal mammary graft to the LAD.  I am not sure that his chest discomfort with rest is due to his coronary disease but it certainly could be.  I reviewed the catheterization films with the patient and his wife and discussed all the above. I discussed the operative procedure with the patient and his wife including alternatives, benefits and risks; including but not limited to bleeding, blood transfusion, infection, stroke, myocardial infarction, graft failure, heart block requiring a permanent pacemaker, organ dysfunction, and death.  [name] understands and agrees to proceed.    Plan:  Coronary artery bypass graft surgery.   Gaye Pollack, MD [facility] [phone]

## 2018-07-03 ENCOUNTER — Inpatient Hospital Stay (HOSPITAL_COMMUNITY): Payer: BLUE CROSS/BLUE SHIELD

## 2018-07-03 ENCOUNTER — Inpatient Hospital Stay (HOSPITAL_COMMUNITY): Payer: BLUE CROSS/BLUE SHIELD | Admitting: Physician Assistant

## 2018-07-03 ENCOUNTER — Inpatient Hospital Stay (HOSPITAL_COMMUNITY)
Admission: RE | Disposition: A | Discharge status: Home / Self Care | Payer: Self-pay | Source: Home / Self Care | Attending: Surgery

## 2018-07-03 ENCOUNTER — Inpatient Hospital Stay (HOSPITAL_COMMUNITY): Payer: BLUE CROSS/BLUE SHIELD | Admitting: Certified Registered Nurse Anesthetist

## 2018-07-03 ENCOUNTER — Inpatient Hospital Stay (HOSPITAL_COMMUNITY)
Admission: RE | Admit: 2018-07-03 | Discharge: 2018-07-07 | DRG: 236 | Disposition: A | Discharge status: Home / Self Care | Payer: BLUE CROSS/BLUE SHIELD | Attending: Surgery | Admitting: Surgery

## 2018-07-03 ENCOUNTER — Other Ambulatory Visit: Payer: Self-pay

## 2018-07-03 ENCOUNTER — Encounter (HOSPITAL_COMMUNITY): Payer: Self-pay | Admitting: *Deleted

## 2018-07-03 DIAGNOSIS — Z79899 Other long term (current) drug therapy: Secondary | ICD-10-CM | POA: Diagnosis not present

## 2018-07-03 DIAGNOSIS — E785 Hyperlipidemia, unspecified: Secondary | ICD-10-CM | POA: Diagnosis not present

## 2018-07-03 DIAGNOSIS — Y831 Surgical operation with implant of artificial internal device as the cause of abnormal reaction of the patient, or of later complication, without mention of misadventure at the time of the procedure: Secondary | ICD-10-CM | POA: Diagnosis not present

## 2018-07-03 DIAGNOSIS — T82855A Stenosis of coronary artery stent, initial encounter: Secondary | ICD-10-CM | POA: Diagnosis not present

## 2018-07-03 DIAGNOSIS — I2510 Atherosclerotic heart disease of native coronary artery without angina pectoris: Secondary | ICD-10-CM | POA: Diagnosis not present

## 2018-07-03 DIAGNOSIS — Z8249 Family history of ischemic heart disease and other diseases of the circulatory system: Secondary | ICD-10-CM | POA: Diagnosis not present

## 2018-07-03 DIAGNOSIS — I10 Essential (primary) hypertension: Secondary | ICD-10-CM | POA: Diagnosis present

## 2018-07-03 DIAGNOSIS — Z955 Presence of coronary angioplasty implant and graft: Secondary | ICD-10-CM | POA: Diagnosis not present

## 2018-07-03 DIAGNOSIS — D62 Acute posthemorrhagic anemia: Secondary | ICD-10-CM | POA: Diagnosis not present

## 2018-07-03 DIAGNOSIS — Z7982 Long term (current) use of aspirin: Secondary | ICD-10-CM | POA: Diagnosis not present

## 2018-07-03 DIAGNOSIS — I25119 Atherosclerotic heart disease of native coronary artery with unspecified angina pectoris: Secondary | ICD-10-CM

## 2018-07-03 DIAGNOSIS — K5900 Constipation, unspecified: Secondary | ICD-10-CM | POA: Diagnosis not present

## 2018-07-03 DIAGNOSIS — N400 Enlarged prostate without lower urinary tract symptoms: Secondary | ICD-10-CM | POA: Diagnosis not present

## 2018-07-03 DIAGNOSIS — I252 Old myocardial infarction: Secondary | ICD-10-CM

## 2018-07-03 DIAGNOSIS — J90 Pleural effusion, not elsewhere classified: Secondary | ICD-10-CM | POA: Diagnosis not present

## 2018-07-03 DIAGNOSIS — Z4682 Encounter for fitting and adjustment of non-vascular catheter: Secondary | ICD-10-CM | POA: Diagnosis not present

## 2018-07-03 DIAGNOSIS — Z951 Presence of aortocoronary bypass graft: Secondary | ICD-10-CM | POA: Diagnosis not present

## 2018-07-03 DIAGNOSIS — E8770 Fluid overload, unspecified: Secondary | ICD-10-CM | POA: Diagnosis not present

## 2018-07-03 DIAGNOSIS — I080 Rheumatic disorders of both mitral and aortic valves: Secondary | ICD-10-CM | POA: Diagnosis not present

## 2018-07-03 DIAGNOSIS — Z09 Encounter for follow-up examination after completed treatment for conditions other than malignant neoplasm: Secondary | ICD-10-CM

## 2018-07-03 DIAGNOSIS — I371 Nonrheumatic pulmonary valve insufficiency: Secondary | ICD-10-CM | POA: Diagnosis not present

## 2018-07-03 HISTORY — PX: TEE WITHOUT CARDIOVERSION: SHX5443

## 2018-07-03 HISTORY — PX: CORONARY ARTERY BYPASS GRAFT: SHX141

## 2018-07-03 LAB — APTT: aPTT: 37 seconds — ABNORMAL HIGH (ref 24–36)

## 2018-07-03 LAB — CBC
HCT: 37.7 % — ABNORMAL LOW (ref 39.0–52.0)
HCT: 37.6 % — ABNORMAL LOW (ref 39.0–52.0)
Hemoglobin: 12.3 g/dL — ABNORMAL LOW (ref 13.0–17.0)
Hemoglobin: 11.8 g/dL — ABNORMAL LOW (ref 13.0–17.0)
MCH: 29.6 pg (ref 26.0–34.0)
MCH: 29.1 pg (ref 26.0–34.0)
MCHC: 32.6 g/dL (ref 30.0–36.0)
MCHC: 31.4 g/dL (ref 30.0–36.0)
MCV: 90.6 fL (ref 80.0–100.0)
MCV: 92.6 fL (ref 80.0–100.0)
Platelets: 182 10*3/uL (ref 150–400)
Platelets: 177 10*3/uL (ref 150–400)
RBC: 4.16 MIL/uL — ABNORMAL LOW (ref 4.22–5.81)
RBC: 4.06 MIL/uL — ABNORMAL LOW (ref 4.22–5.81)
RDW: 13.7 % (ref 11.5–15.5)
RDW: 13.7 % (ref 11.5–15.5)
WBC: 20 10*3/uL — ABNORMAL HIGH (ref 4.0–10.5)
WBC: 16.5 10*3/uL — ABNORMAL HIGH (ref 4.0–10.5)
nRBC: 0 % (ref 0.0–0.2)
nRBC: 0 % (ref 0.0–0.2)

## 2018-07-03 LAB — POCT I-STAT, CHEM 8
BUN: 17 mg/dL (ref 8–23)
BUN: 15 mg/dL (ref 8–23)
BUN: 15 mg/dL (ref 8–23)
BUN: 14 mg/dL (ref 8–23)
BUN: 15 mg/dL (ref 8–23)
Calcium, Ion: 1.21 mmol/L (ref 1.15–1.40)
Calcium, Ion: 1.21 mmol/L (ref 1.15–1.40)
Calcium, Ion: 1.03 mmol/L — ABNORMAL LOW (ref 1.15–1.40)
Calcium, Ion: 1.03 mmol/L — ABNORMAL LOW (ref 1.15–1.40)
Calcium, Ion: 1.1 mmol/L — ABNORMAL LOW (ref 1.15–1.40)
Chloride: 102 mmol/L (ref 98–111)
Chloride: 105 mmol/L (ref 98–111)
Chloride: 104 mmol/L (ref 98–111)
Chloride: 104 mmol/L (ref 98–111)
Chloride: 108 mmol/L (ref 98–111)
Creatinine, Ser: 0.8 mg/dL (ref 0.61–1.24)
Creatinine, Ser: 0.9 mg/dL (ref 0.61–1.24)
Creatinine, Ser: 0.8 mg/dL (ref 0.61–1.24)
Creatinine, Ser: 0.8 mg/dL (ref 0.61–1.24)
Creatinine, Ser: 0.9 mg/dL (ref 0.61–1.24)
Glucose, Bld: 101 mg/dL — ABNORMAL HIGH (ref 70–99)
Glucose, Bld: 101 mg/dL — ABNORMAL HIGH (ref 70–99)
Glucose, Bld: 98 mg/dL (ref 70–99)
Glucose, Bld: 103 mg/dL — ABNORMAL HIGH (ref 70–99)
Glucose, Bld: 124 mg/dL — ABNORMAL HIGH (ref 70–99)
HCT: 38 % — ABNORMAL LOW (ref 39.0–52.0)
HCT: 35 % — ABNORMAL LOW (ref 39.0–52.0)
HCT: 31 % — ABNORMAL LOW (ref 39.0–52.0)
HCT: 30 % — ABNORMAL LOW (ref 39.0–52.0)
HCT: 35 % — ABNORMAL LOW (ref 39.0–52.0)
Hemoglobin: 12.9 g/dL — ABNORMAL LOW (ref 13.0–17.0)
Hemoglobin: 11.9 g/dL — ABNORMAL LOW (ref 13.0–17.0)
Hemoglobin: 10.5 g/dL — ABNORMAL LOW (ref 13.0–17.0)
Hemoglobin: 10.2 g/dL — ABNORMAL LOW (ref 13.0–17.0)
Hemoglobin: 11.9 g/dL — ABNORMAL LOW (ref 13.0–17.0)
Potassium: 3.9 mmol/L (ref 3.5–5.1)
Potassium: 4.1 mmol/L (ref 3.5–5.1)
Potassium: 4.6 mmol/L (ref 3.5–5.1)
Potassium: 4.6 mmol/L (ref 3.5–5.1)
Potassium: 4.5 mmol/L (ref 3.5–5.1)
Sodium: 141 mmol/L (ref 135–145)
Sodium: 140 mmol/L (ref 135–145)
Sodium: 140 mmol/L (ref 135–145)
Sodium: 138 mmol/L (ref 135–145)
Sodium: 141 mmol/L (ref 135–145)
TCO2: 29 mmol/L (ref 22–32)
TCO2: 26 mmol/L (ref 22–32)
TCO2: 30 mmol/L (ref 22–32)
TCO2: 26 mmol/L (ref 22–32)
TCO2: 21 mmol/L — ABNORMAL LOW (ref 22–32)

## 2018-07-03 LAB — POCT I-STAT 3, ART BLOOD GAS (G3+)
Acid-Base Excess: 4 mmol/L — ABNORMAL HIGH (ref 0.0–2.0)
Acid-Base Excess: 1 mmol/L (ref 0.0–2.0)
Acid-base deficit: 1 mmol/L (ref 0.0–2.0)
Acid-base deficit: 4 mmol/L — ABNORMAL HIGH (ref 0.0–2.0)
Acid-base deficit: 4 mmol/L — ABNORMAL HIGH (ref 0.0–2.0)
Bicarbonate: 28.6 mmol/L — ABNORMAL HIGH (ref 20.0–28.0)
Bicarbonate: 25.4 mmol/L (ref 20.0–28.0)
Bicarbonate: 25.4 mmol/L (ref 20.0–28.0)
Bicarbonate: 21.7 mmol/L (ref 20.0–28.0)
Bicarbonate: 21.4 mmol/L (ref 20.0–28.0)
O2 Saturation: 100 %
O2 Saturation: 100 %
O2 Saturation: 97 %
O2 Saturation: 97 %
O2 Saturation: 94 %
Patient temperature: 35.4
Patient temperature: 37.2
Patient temperature: 37.5
TCO2: 30 mmol/L (ref 22–32)
TCO2: 27 mmol/L (ref 22–32)
TCO2: 27 mmol/L (ref 22–32)
TCO2: 23 mmol/L (ref 22–32)
TCO2: 23 mmol/L (ref 22–32)
pCO2 arterial: 41.1 mmHg (ref 32.0–48.0)
pCO2 arterial: 37.1 mmHg (ref 32.0–48.0)
pCO2 arterial: 45.3 mmHg (ref 32.0–48.0)
pCO2 arterial: 41.8 mmHg (ref 32.0–48.0)
pCO2 arterial: 39.4 mmHg (ref 32.0–48.0)
pH, Arterial: 7.451 — ABNORMAL HIGH (ref 7.350–7.450)
pH, Arterial: 7.444 (ref 7.350–7.450)
pH, Arterial: 7.349 — ABNORMAL LOW (ref 7.350–7.450)
pH, Arterial: 7.324 — ABNORMAL LOW (ref 7.350–7.450)
pH, Arterial: 7.345 — ABNORMAL LOW (ref 7.350–7.450)
pO2, Arterial: 390 mmHg — ABNORMAL HIGH (ref 83.0–108.0)
pO2, Arterial: 224 mmHg — ABNORMAL HIGH (ref 83.0–108.0)
pO2, Arterial: 85 mmHg (ref 83.0–108.0)
pO2, Arterial: 99 mmHg (ref 83.0–108.0)
pO2, Arterial: 77 mmHg — ABNORMAL LOW (ref 83.0–108.0)

## 2018-07-03 LAB — GLUCOSE, CAPILLARY
Glucose-Capillary: 107 mg/dL — ABNORMAL HIGH (ref 70–99)
Glucose-Capillary: 119 mg/dL — ABNORMAL HIGH (ref 70–99)
Glucose-Capillary: 143 mg/dL — ABNORMAL HIGH (ref 70–99)
Glucose-Capillary: 135 mg/dL — ABNORMAL HIGH (ref 70–99)

## 2018-07-03 LAB — ECHO TEE
AV Vena cont: 0.3 cm
P 1/2 time: 618 ms

## 2018-07-03 LAB — POCT I-STAT 4, (NA,K, GLUC, HGB,HCT)
Glucose, Bld: 111 mg/dL — ABNORMAL HIGH (ref 70–99)
HCT: 35 % — ABNORMAL LOW (ref 39.0–52.0)
Hemoglobin: 11.9 g/dL — ABNORMAL LOW (ref 13.0–17.0)
Potassium: 4 mmol/L (ref 3.5–5.1)
Sodium: 142 mmol/L (ref 135–145)

## 2018-07-03 LAB — HEMOGLOBIN AND HEMATOCRIT, BLOOD
HCT: 32.5 % — ABNORMAL LOW (ref 39.0–52.0)
Hemoglobin: 10.6 g/dL — ABNORMAL LOW (ref 13.0–17.0)

## 2018-07-03 LAB — CREATININE, SERUM
Creatinine, Ser: 1.03 mg/dL (ref 0.61–1.24)
GFR calc Af Amer: >60 mL/min (ref >60)
GFR calc non Af Amer: >60 mL/min (ref >60)

## 2018-07-03 LAB — MAGNESIUM: Magnesium: 3.2 mg/dL — ABNORMAL HIGH (ref 1.7–2.4)

## 2018-07-03 LAB — PROTIME-INR
INR: 1.31
Prothrombin Time: 16.2 seconds — ABNORMAL HIGH (ref 11.4–15.2)

## 2018-07-03 LAB — PLATELET COUNT: Platelets: 173 10*3/uL (ref 150–400)

## 2018-07-03 SURGERY — CORONARY ARTERY BYPASS GRAFTING (CABG)
Anesthesia: General | Site: Chest

## 2018-07-03 MED ORDER — LACTATED RINGERS IV SOLN
INTRAVENOUS | Status: DC | PRN
  Administered 2018-07-03: 07:00:00 via INTRAVENOUS

## 2018-07-03 MED ORDER — MIDAZOLAM HCL 10 MG/2ML IJ SOLN
INTRAMUSCULAR | Status: AC
  Filled 2018-07-03: qty 2

## 2018-07-03 MED ORDER — ACETAMINOPHEN 650 MG RE SUPP
650.0000 mg | Freq: Once | RECTAL | Status: AC
  Administered 2018-07-03: 650 mg via RECTAL

## 2018-07-03 MED ORDER — CHLORHEXIDINE GLUCONATE 0.12 % MT SOLN
15.0000 mL | Freq: Once | OROMUCOSAL | Status: AC
  Administered 2018-07-03: 15 mL via OROMUCOSAL
  Filled 2018-07-03: qty 15

## 2018-07-03 MED ORDER — BISACODYL 10 MG RE SUPP: 10.0000 mg | Freq: Every day | RECTAL | Status: DC

## 2018-07-03 MED ORDER — ROCURONIUM BROMIDE 50 MG/5ML IV SOSY
PREFILLED_SYRINGE | INTRAVENOUS | Status: AC
  Filled 2018-07-03: qty 5

## 2018-07-03 MED ORDER — PROPOFOL 10 MG/ML IV BOLUS
INTRAVENOUS | Status: DC | PRN
  Administered 2018-07-03: 30 mg via INTRAVENOUS
  Administered 2018-07-03: 130 mg via INTRAVENOUS

## 2018-07-03 MED ORDER — METOPROLOL TARTRATE 5 MG/5ML IV SOLN
2.5000 mg | INTRAVENOUS | Status: DC | PRN
  Administered 2018-07-04: 5 mg via INTRAVENOUS
  Filled 2018-07-03: qty 5

## 2018-07-03 MED ORDER — BISACODYL 5 MG PO TBEC
10.0000 mg | DELAYED_RELEASE_TABLET | Freq: Every day | ORAL | Status: DC
  Administered 2018-07-04 – 2018-07-05 (×2): 10 mg via ORAL
  Filled 2018-07-03 (×2): qty 2

## 2018-07-03 MED ORDER — THROMBIN 20000 UNITS EX SOLR: CUTANEOUS | Status: DC | PRN

## 2018-07-03 MED ORDER — ATORVASTATIN CALCIUM 80 MG PO TABS
80.0000 mg | ORAL_TABLET | Freq: Every day | ORAL | Status: DC
  Administered 2018-07-04 – 2018-07-06 (×3): 80 mg via ORAL
  Filled 2018-07-03 (×3): qty 1

## 2018-07-03 MED ORDER — LACTATED RINGERS IV SOLN
INTRAVENOUS | Status: DC
  Administered 2018-07-03: 22:00:00 via INTRAVENOUS

## 2018-07-03 MED ORDER — HEPARIN SODIUM (PORCINE) 1000 UNIT/ML IJ SOLN
INTRAMUSCULAR | Status: AC
  Filled 2018-07-03: qty 1

## 2018-07-03 MED ORDER — DEXMEDETOMIDINE HCL IN NACL 200 MCG/50ML IV SOLN
0.0000 ug/kg/h | INTRAVENOUS | Status: DC
  Filled 2018-07-03: qty 50

## 2018-07-03 MED ORDER — METOPROLOL TARTRATE 12.5 MG HALF TABLET
12.5000 mg | ORAL_TABLET | Freq: Once | ORAL | Status: AC
  Administered 2018-07-03: 12.5 mg via ORAL
  Filled 2018-07-03: qty 1

## 2018-07-03 MED ORDER — SODIUM CHLORIDE 0.9% FLUSH
3.0000 mL | Freq: Two times a day (BID) | INTRAVENOUS | Status: DC
  Administered 2018-07-04 – 2018-07-05 (×2): 3 mL via INTRAVENOUS

## 2018-07-03 MED ORDER — INSULIN ASPART 100 UNIT/ML ~~LOC~~ SOLN
0.0000 [IU] | SUBCUTANEOUS | Status: DC
  Administered 2018-07-03 – 2018-07-04 (×4): 2 [IU] via SUBCUTANEOUS

## 2018-07-03 MED ORDER — SODIUM CHLORIDE 0.45 % IV SOLN
INTRAVENOUS | Status: DC | PRN
  Administered 2018-07-03: 13:00:00 via INTRAVENOUS

## 2018-07-03 MED ORDER — ROCURONIUM BROMIDE 10 MG/ML (PF) SYRINGE
PREFILLED_SYRINGE | INTRAVENOUS | Status: DC | PRN
  Administered 2018-07-03 (×2): 50 mg via INTRAVENOUS

## 2018-07-03 MED ORDER — SODIUM CHLORIDE 0.9 % IV SOLN: INTRAVENOUS | Status: DC

## 2018-07-03 MED ORDER — THROMBIN 5000 UNITS EX SOLR
OROMUCOSAL | Status: DC | PRN
  Administered 2018-07-03 (×3): 5000 mL via TOPICAL

## 2018-07-03 MED ORDER — SODIUM CHLORIDE 0.9 % IV SOLN: 250.0000 mL | INTRAVENOUS | Status: DC

## 2018-07-03 MED ORDER — PROTAMINE SULFATE 10 MG/ML IV SOLN
INTRAVENOUS | Status: AC
  Filled 2018-07-03: qty 5

## 2018-07-03 MED ORDER — ACETAMINOPHEN 500 MG PO TABS
1000.0000 mg | ORAL_TABLET | Freq: Four times a day (QID) | ORAL | Status: DC
  Administered 2018-07-03 – 2018-07-05 (×7): 1000 mg via ORAL
  Filled 2018-07-03 (×7): qty 2

## 2018-07-03 MED ORDER — MAGNESIUM SULFATE 4 GM/100ML IV SOLN
4.0000 g | Freq: Once | INTRAVENOUS | Status: AC
  Administered 2018-07-03: 4 g via INTRAVENOUS
  Filled 2018-07-03: qty 100

## 2018-07-03 MED ORDER — ASPIRIN EC 325 MG PO TBEC
325.0000 mg | DELAYED_RELEASE_TABLET | Freq: Every day | ORAL | Status: DC
  Administered 2018-07-04 – 2018-07-05 (×2): 325 mg via ORAL
  Filled 2018-07-03 (×2): qty 1

## 2018-07-03 MED ORDER — PROTAMINE SULFATE 10 MG/ML IV SOLN
INTRAVENOUS | Status: AC
  Filled 2018-07-03: qty 25

## 2018-07-03 MED ORDER — VANCOMYCIN HCL IN DEXTROSE 1-5 GM/200ML-% IV SOLN
1000.0000 mg | Freq: Once | INTRAVENOUS | Status: AC
  Administered 2018-07-03: 1000 mg via INTRAVENOUS
  Filled 2018-07-03: qty 200

## 2018-07-03 MED ORDER — HEPARIN SODIUM (PORCINE) 1000 UNIT/ML IJ SOLN
INTRAMUSCULAR | Status: DC | PRN
  Administered 2018-07-03: 32000 [IU] via INTRAVENOUS

## 2018-07-03 MED ORDER — PHENYLEPHRINE HCL-NACL 20-0.9 MG/250ML-% IV SOLN
0.0000 ug/min | INTRAVENOUS | Status: DC
  Filled 2018-07-03: qty 250

## 2018-07-03 MED ORDER — SODIUM CHLORIDE 0.9% FLUSH: 3.0000 mL | INTRAVENOUS | Status: DC | PRN

## 2018-07-03 MED ORDER — HEMOSTATIC AGENTS (NO CHARGE) OPTIME
TOPICAL | Status: DC | PRN
  Administered 2018-07-03: 1 via TOPICAL

## 2018-07-03 MED ORDER — SODIUM CHLORIDE 0.9% FLUSH
10.0000 mL | Freq: Two times a day (BID) | INTRAVENOUS | Status: DC
  Administered 2018-07-03 – 2018-07-04 (×2): 10 mL

## 2018-07-03 MED ORDER — ADULT MULTIVITAMIN W/MINERALS CH: 1.0000 | ORAL_TABLET | Freq: Every day | ORAL | Status: DC

## 2018-07-03 MED ORDER — POTASSIUM CHLORIDE 10 MEQ/50ML IV SOLN: 10.0000 meq | INTRAVENOUS | Status: AC

## 2018-07-03 MED ORDER — OXYCODONE HCL 5 MG PO TABS
5.0000 mg | ORAL_TABLET | ORAL | Status: DC | PRN
  Administered 2018-07-03 – 2018-07-05 (×6): 5 mg via ORAL
  Filled 2018-07-03 (×6): qty 1

## 2018-07-03 MED ORDER — CHLORHEXIDINE GLUCONATE 0.12 % MT SOLN
15.0000 mL | OROMUCOSAL | Status: AC
  Administered 2018-07-03: 15 mL via OROMUCOSAL

## 2018-07-03 MED ORDER — LIDOCAINE 2% (20 MG/ML) 5 ML SYRINGE
INTRAMUSCULAR | Status: DC | PRN
  Administered 2018-07-03: 100 mg via INTRAVENOUS

## 2018-07-03 MED ORDER — SODIUM CHLORIDE 0.9 % IV SOLN
1.5000 g | Freq: Two times a day (BID) | INTRAVENOUS | Status: AC
  Administered 2018-07-03 – 2018-07-05 (×4): 1.5 g via INTRAVENOUS
  Filled 2018-07-03 (×4): qty 1.5

## 2018-07-03 MED ORDER — LACTATED RINGERS IV SOLN: 500.0000 mL | Freq: Once | INTRAVENOUS | Status: DC | PRN

## 2018-07-03 MED ORDER — METOPROLOL TARTRATE 12.5 MG HALF TABLET
12.5000 mg | ORAL_TABLET | Freq: Two times a day (BID) | ORAL | Status: DC
  Administered 2018-07-03 – 2018-07-05 (×4): 12.5 mg via ORAL
  Filled 2018-07-03 (×4): qty 1

## 2018-07-03 MED ORDER — FENTANYL CITRATE (PF) 250 MCG/5ML IJ SOLN
INTRAMUSCULAR | Status: DC | PRN
  Administered 2018-07-03: 250 ug via INTRAVENOUS
  Administered 2018-07-03 (×2): 50 ug via INTRAVENOUS
  Administered 2018-07-03: 200 ug via INTRAVENOUS
  Administered 2018-07-03: 50 ug via INTRAVENOUS
  Administered 2018-07-03: 150 ug via INTRAVENOUS
  Administered 2018-07-03: 50 ug via INTRAVENOUS
  Administered 2018-07-03: 200 ug via INTRAVENOUS
  Administered 2018-07-03: 50 ug via INTRAVENOUS
  Administered 2018-07-03: 200 ug via INTRAVENOUS
  Administered 2018-07-03 (×2): 50 ug via INTRAVENOUS

## 2018-07-03 MED ORDER — PROTAMINE SULFATE 10 MG/ML IV SOLN
INTRAVENOUS | Status: DC | PRN
  Administered 2018-07-03: 295 mg via INTRAVENOUS
  Administered 2018-07-03: 25 mg via INTRAVENOUS

## 2018-07-03 MED ORDER — MIDAZOLAM HCL 2 MG/2ML IJ SOLN: 2.0000 mg | INTRAMUSCULAR | Status: DC | PRN

## 2018-07-03 MED ORDER — DOCUSATE SODIUM 100 MG PO CAPS
200.0000 mg | ORAL_CAPSULE | Freq: Every day | ORAL | Status: DC
  Administered 2018-07-04 – 2018-07-05 (×2): 200 mg via ORAL
  Filled 2018-07-03 (×2): qty 2

## 2018-07-03 MED ORDER — SODIUM CHLORIDE 0.9 % IV SOLN
INTRAVENOUS | Status: DC | PRN
  Administered 2018-07-03: 750 mg via INTRAVENOUS

## 2018-07-03 MED ORDER — ASPIRIN 81 MG PO CHEW: 324.0000 mg | CHEWABLE_TABLET | Freq: Every day | ORAL | Status: DC

## 2018-07-03 MED ORDER — TRAMADOL HCL 50 MG PO TABS
50.0000 mg | ORAL_TABLET | ORAL | Status: DC | PRN
  Administered 2018-07-03 – 2018-07-05 (×5): 50 mg via ORAL
  Filled 2018-07-03 (×5): qty 1

## 2018-07-03 MED ORDER — ORAL CARE MOUTH RINSE
15.0000 mL | Freq: Two times a day (BID) | OROMUCOSAL | Status: DC
  Administered 2018-07-03 – 2018-07-05 (×5): 15 mL via OROMUCOSAL

## 2018-07-03 MED ORDER — FAMOTIDINE IN NACL 20-0.9 MG/50ML-% IV SOLN
20.0000 mg | Freq: Two times a day (BID) | INTRAVENOUS | Status: DC
  Administered 2018-07-03: 20 mg via INTRAVENOUS

## 2018-07-03 MED ORDER — EPHEDRINE SULFATE-NACL 50-0.9 MG/10ML-% IV SOSY
PREFILLED_SYRINGE | INTRAVENOUS | Status: DC | PRN
  Administered 2018-07-03: 5 mg via INTRAVENOUS

## 2018-07-03 MED ORDER — ACETAMINOPHEN 160 MG/5ML PO SOLN: 650.0000 mg | Freq: Once | ORAL | Status: AC

## 2018-07-03 MED ORDER — CHLORHEXIDINE GLUCONATE 4 % EX LIQD: 30.0000 mL | CUTANEOUS | Status: DC

## 2018-07-03 MED ORDER — SODIUM CHLORIDE 0.9% FLUSH: 10.0000 mL | INTRAVENOUS | Status: DC | PRN

## 2018-07-03 MED ORDER — PANTOPRAZOLE SODIUM 40 MG PO TBEC
40.0000 mg | DELAYED_RELEASE_TABLET | Freq: Every day | ORAL | Status: DC
  Administered 2018-07-05: 40 mg via ORAL
  Filled 2018-07-03: qty 1

## 2018-07-03 MED ORDER — THROMBIN 20000 UNITS EX KIT
PACK | CUTANEOUS | Status: AC
  Filled 2018-07-03: qty 1

## 2018-07-03 MED ORDER — FENTANYL CITRATE (PF) 250 MCG/5ML IJ SOLN
INTRAMUSCULAR | Status: AC
  Filled 2018-07-03: qty 25

## 2018-07-03 MED ORDER — ALBUMIN HUMAN 5 % IV SOLN
INTRAVENOUS | Status: DC | PRN
  Administered 2018-07-03: 12:00:00 via INTRAVENOUS

## 2018-07-03 MED ORDER — THROMBIN 5000 UNITS EX SOLR
CUTANEOUS | Status: AC
  Filled 2018-07-03: qty 15000

## 2018-07-03 MED ORDER — MIDAZOLAM HCL 5 MG/5ML IJ SOLN
INTRAMUSCULAR | Status: DC | PRN
  Administered 2018-07-03 (×2): 1 mg via INTRAVENOUS
  Administered 2018-07-03: 2 mg via INTRAVENOUS
  Administered 2018-07-03: 4 mg via INTRAVENOUS

## 2018-07-03 MED ORDER — MORPHINE SULFATE (PF) 2 MG/ML IV SOLN
1.0000 mg | INTRAVENOUS | Status: AC | PRN
  Administered 2018-07-03 (×2): 2 mg via INTRAVENOUS
  Filled 2018-07-03: qty 1

## 2018-07-03 MED ORDER — PROPOFOL 10 MG/ML IV BOLUS
INTRAVENOUS | Status: AC
  Filled 2018-07-03: qty 20

## 2018-07-03 MED ORDER — FENTANYL CITRATE (PF) 250 MCG/5ML IJ SOLN
INTRAMUSCULAR | Status: AC
  Filled 2018-07-03: qty 5

## 2018-07-03 MED ORDER — SODIUM CHLORIDE 0.9 % IJ SOLN
INTRAMUSCULAR | Status: AC
  Filled 2018-07-03: qty 20

## 2018-07-03 MED ORDER — NITROGLYCERIN IN D5W 200-5 MCG/ML-% IV SOLN: 0.0000 ug/min | INTRAVENOUS | Status: DC

## 2018-07-03 MED ORDER — ONDANSETRON HCL 4 MG/2ML IJ SOLN: 4.0000 mg | Freq: Four times a day (QID) | INTRAMUSCULAR | Status: DC | PRN

## 2018-07-03 MED ORDER — INSULIN REGULAR(HUMAN) IN NACL 100-0.9 UT/100ML-% IV SOLN: INTRAVENOUS | Status: DC

## 2018-07-03 MED ORDER — METOPROLOL TARTRATE 25 MG/10 ML ORAL SUSPENSION: 12.5000 mg | Freq: Two times a day (BID) | ORAL | Status: DC

## 2018-07-03 MED ORDER — CHLORHEXIDINE GLUCONATE CLOTH 2 % EX PADS
6.0000 | MEDICATED_PAD | Freq: Every day | CUTANEOUS | Status: DC
  Administered 2018-07-03 – 2018-07-05 (×3): 6 via TOPICAL

## 2018-07-03 MED ORDER — LIDOCAINE HCL (CARDIAC) PF 100 MG/5ML IV SOSY: PREFILLED_SYRINGE | INTRAVENOUS | Status: DC | PRN

## 2018-07-03 MED ORDER — LACTATED RINGERS IV SOLN: INTRAVENOUS | Status: DC

## 2018-07-03 MED ORDER — ACETAMINOPHEN 160 MG/5ML PO SOLN: 1000.0000 mg | Freq: Four times a day (QID) | ORAL | Status: DC

## 2018-07-03 MED ORDER — LIDOCAINE 2% (20 MG/ML) 5 ML SYRINGE
INTRAMUSCULAR | Status: AC
  Filled 2018-07-03: qty 5

## 2018-07-03 MED ORDER — THROMBIN (RECOMBINANT) 20000 UNITS EX SOLR
CUTANEOUS | Status: AC
  Filled 2018-07-03: qty 20000

## 2018-07-03 MED ORDER — 0.9 % SODIUM CHLORIDE (POUR BTL) OPTIME
TOPICAL | Status: DC | PRN
  Administered 2018-07-03: 6000 mL

## 2018-07-03 MED ORDER — INSULIN REGULAR BOLUS VIA INFUSION
0.0000 [IU] | Freq: Three times a day (TID) | INTRAVENOUS | Status: DC
  Filled 2018-07-03: qty 10

## 2018-07-03 MED ORDER — ARTIFICIAL TEARS OPHTHALMIC OINT
TOPICAL_OINTMENT | OPHTHALMIC | Status: AC
  Filled 2018-07-03: qty 3.5

## 2018-07-03 MED ORDER — ALBUMIN HUMAN 5 % IV SOLN
250.0000 mL | INTRAVENOUS | Status: AC | PRN
  Administered 2018-07-03 (×2): 12.5 g via INTRAVENOUS

## 2018-07-03 MED ORDER — MORPHINE SULFATE (PF) 2 MG/ML IV SOLN
2.0000 mg | INTRAVENOUS | Status: DC | PRN
  Administered 2018-07-03 – 2018-07-04 (×6): 2 mg via INTRAVENOUS
  Filled 2018-07-03 (×7): qty 1

## 2018-07-03 SURGICAL SUPPLY — 103 items
BAG DECANTER FOR FLEXI CONT (MISCELLANEOUS) ×3 IMPLANT
BANDAGE ACE 4X5 VEL STRL LF (GAUZE/BANDAGES/DRESSINGS) ×3 IMPLANT
BANDAGE ACE 6X5 VEL STRL LF (GAUZE/BANDAGES/DRESSINGS) ×3 IMPLANT
BASKET HEART (ORDER IN 25'S) (MISCELLANEOUS) ×1
BASKET HEART (ORDER IN 25S) (MISCELLANEOUS) ×2 IMPLANT
BLADE STERNUM SYSTEM 6 (BLADE) ×3 IMPLANT
BLADE SURG 11 STRL SS (BLADE) ×3 IMPLANT
BNDG GAUZE ELAST 4 BULKY (GAUZE/BANDAGES/DRESSINGS) ×3 IMPLANT
CANISTER SUCT 3000ML PPV (MISCELLANEOUS) ×3 IMPLANT
CATH ROBINSON RED A/P 18FR (CATHETERS) ×6 IMPLANT
CATH THORACIC 28FR (CATHETERS) ×3 IMPLANT
CATH THORACIC 36FR (CATHETERS) ×3 IMPLANT
CATH THORACIC 36FR RT ANG (CATHETERS) ×3 IMPLANT
CLIP VESOCCLUDE MED 24/CT (CLIP) IMPLANT
CLIP VESOCCLUDE SM WIDE 24/CT (CLIP) IMPLANT
COVER WAND RF STERILE (DRAPES) ×3 IMPLANT
CRADLE DONUT ADULT HEAD (MISCELLANEOUS) ×3 IMPLANT
DRAPE CARDIOVASCULAR INCISE (DRAPES) ×1
DRAPE HALF SHEET 40X57 (DRAPES) ×3 IMPLANT
DRAPE SLUSH/WARMER DISC (DRAPES) ×3 IMPLANT
DRAPE SRG 135X102X78XABS (DRAPES) ×2 IMPLANT
DRSG AQUACEL AG ADV 3.5X14 (GAUZE/BANDAGES/DRESSINGS) IMPLANT
DRSG COVADERM 4X14 (GAUZE/BANDAGES/DRESSINGS) ×3 IMPLANT
ELECT CAUTERY BLADE 6.4 (BLADE) ×3 IMPLANT
ELECT REM PT RETURN 9FT ADLT (ELECTROSURGICAL) ×6
ELECTRODE REM PT RTRN 9FT ADLT (ELECTROSURGICAL) ×4 IMPLANT
FELT TEFLON 1X6 (MISCELLANEOUS) ×6 IMPLANT
GAUZE SPONGE 4X4 12PLY STRL (GAUZE/BANDAGES/DRESSINGS) ×6 IMPLANT
GAUZE SPONGE 4X4 12PLY STRL LF (GAUZE/BANDAGES/DRESSINGS) ×6 IMPLANT
GLOVE BIO SURGEON STRL SZ 6 (GLOVE) ×9 IMPLANT
GLOVE BIO SURGEON STRL SZ 6.5 (GLOVE) ×18 IMPLANT
GLOVE BIO SURGEON STRL SZ7 (GLOVE) ×6 IMPLANT
GLOVE BIO SURGEON STRL SZ7.5 (GLOVE) IMPLANT
GLOVE BIOGEL PI IND STRL 6 (GLOVE) ×2 IMPLANT
GLOVE BIOGEL PI IND STRL 6.5 (GLOVE) IMPLANT
GLOVE BIOGEL PI IND STRL 7.0 (GLOVE) ×2 IMPLANT
GLOVE BIOGEL PI INDICATOR 6 (GLOVE) ×1
GLOVE BIOGEL PI INDICATOR 6.5 (GLOVE)
GLOVE BIOGEL PI INDICATOR 7.0 (GLOVE) ×1
GLOVE EUDERMIC 7 POWDERFREE (GLOVE) ×6 IMPLANT
GLOVE ORTHO TXT STRL SZ7.5 (GLOVE) IMPLANT
GOWN STRL REUS W/ TWL LRG LVL3 (GOWN DISPOSABLE) ×16 IMPLANT
GOWN STRL REUS W/ TWL XL LVL3 (GOWN DISPOSABLE) ×2 IMPLANT
GOWN STRL REUS W/TWL LRG LVL3 (GOWN DISPOSABLE) ×8
GOWN STRL REUS W/TWL XL LVL3 (GOWN DISPOSABLE) ×1
HEMOSTAT POWDER SURGIFOAM 1G (HEMOSTASIS) ×9 IMPLANT
HEMOSTAT SURGICEL 2X14 (HEMOSTASIS) ×3 IMPLANT
INSERT FOGARTY 61MM (MISCELLANEOUS) ×3 IMPLANT
INSERT FOGARTY XLG (MISCELLANEOUS) IMPLANT
KIT BASIN OR (CUSTOM PROCEDURE TRAY) ×3 IMPLANT
KIT CATH CPB BARTLE (MISCELLANEOUS) ×3 IMPLANT
KIT SUCTION CATH 14FR (SUCTIONS) ×3 IMPLANT
KIT TURNOVER KIT B (KITS) ×3 IMPLANT
KIT VASOVIEW HEMOPRO VH 3000 (KITS) ×3 IMPLANT
LIGACLIP SM TITANIUM (CLIP) ×3 IMPLANT
NS IRRIG 1000ML POUR BTL (IV SOLUTION) ×18 IMPLANT
PACK E OPEN HEART (SUTURE) ×3 IMPLANT
PACK OPEN HEART (CUSTOM PROCEDURE TRAY) ×3 IMPLANT
PAD ARMBOARD 7.5X6 YLW CONV (MISCELLANEOUS) ×6 IMPLANT
PAD ELECT DEFIB RADIOL ZOLL (MISCELLANEOUS) ×3 IMPLANT
PENCIL BUTTON HOLSTER BLD 10FT (ELECTRODE) ×3 IMPLANT
PUNCH AORTIC ROTATE 4.0MM (MISCELLANEOUS) IMPLANT
PUNCH AORTIC ROTATE 4.5MM 8IN (MISCELLANEOUS) ×3 IMPLANT
PUNCH AORTIC ROTATE 5MM 8IN (MISCELLANEOUS) IMPLANT
SET CARDIOPLEGIA MPS 5001102 (MISCELLANEOUS) ×3 IMPLANT
SPONGE INTESTINAL PEANUT (DISPOSABLE) IMPLANT
SPONGE LAP 18X18 X RAY DECT (DISPOSABLE) IMPLANT
SPONGE LAP 4X18 RFD (DISPOSABLE) ×6 IMPLANT
SUT BONE WAX W31G (SUTURE) ×3 IMPLANT
SUT MNCRL AB 4-0 PS2 18 (SUTURE) ×3 IMPLANT
SUT PROLENE 3 0 SH DA (SUTURE) IMPLANT
SUT PROLENE 3 0 SH1 36 (SUTURE) ×3 IMPLANT
SUT PROLENE 4 0 RB 1 (SUTURE)
SUT PROLENE 4 0 SH DA (SUTURE) IMPLANT
SUT PROLENE 4-0 RB1 .5 CRCL 36 (SUTURE) IMPLANT
SUT PROLENE 5 0 C 1 36 (SUTURE) IMPLANT
SUT PROLENE 6 0 C 1 30 (SUTURE) IMPLANT
SUT PROLENE 7 0 BV 1 (SUTURE) IMPLANT
SUT PROLENE 7 0 BV1 MDA (SUTURE) ×6 IMPLANT
SUT PROLENE 8 0 BV175 6 (SUTURE) IMPLANT
SUT SILK  1 MH (SUTURE)
SUT SILK 1 MH (SUTURE) IMPLANT
SUT STEEL STERNAL CCS#1 18IN (SUTURE) ×6 IMPLANT
SUT STEEL SZ 6 DBL 3X14 BALL (SUTURE) IMPLANT
SUT TEM PAC WIRE 2 0 SH (SUTURE) ×6 IMPLANT
SUT VIC AB 1 CTX 36 (SUTURE) ×3
SUT VIC AB 1 CTX36XBRD ANBCTR (SUTURE) ×6 IMPLANT
SUT VIC AB 2-0 CT1 27 (SUTURE) ×1
SUT VIC AB 2-0 CT1 TAPERPNT 27 (SUTURE) ×2 IMPLANT
SUT VIC AB 2-0 CTX 27 (SUTURE) IMPLANT
SUT VIC AB 3-0 SH 27 (SUTURE)
SUT VIC AB 3-0 SH 27X BRD (SUTURE) IMPLANT
SUT VIC AB 3-0 X1 27 (SUTURE) IMPLANT
SUT VICRYL 4-0 PS2 18IN ABS (SUTURE) IMPLANT
SYSTEM SAHARA CHEST DRAIN ATS (WOUND CARE) ×3 IMPLANT
TAPE CLOTH SURG 4X10 WHT LF (GAUZE/BANDAGES/DRESSINGS) ×3 IMPLANT
TAPE PAPER 2X10 WHT MICROPORE (GAUZE/BANDAGES/DRESSINGS) ×3 IMPLANT
TOWEL GREEN STERILE (TOWEL DISPOSABLE) ×3 IMPLANT
TOWEL GREEN STERILE FF (TOWEL DISPOSABLE) ×3 IMPLANT
TRAY FOLEY SLVR 16FR TEMP STAT (SET/KITS/TRAYS/PACK) ×3 IMPLANT
TUBING INSUFFLATION (TUBING) ×3 IMPLANT
UNDERPAD 30X30 (UNDERPADS AND DIAPERS) ×3 IMPLANT
WATER STERILE IRR 1000ML POUR (IV SOLUTION) ×6 IMPLANT

## 2018-07-03 NOTE — Anesthesia Procedure Notes (Signed)
Arterial Line Insertion Start/End10/07/2018 7:00 AM Performed by: Murvin Natal, MD, Alain Marion, CRNA, CRNA  Patient location: Pre-op. Preanesthetic checklist: patient identified, IV checked, site marked, risks and benefits discussed, surgical consent, monitors and equipment checked, pre-op evaluation, timeout performed and anesthesia consent Lidocaine 1% used for infiltration Left, radial was placed Catheter size: 20 G Hand hygiene performed  and maximum sterile barriers used   Attempts: 2 Procedure performed without using ultrasound guided technique. Ultrasound Notes:anatomy identified, needle tip was noted to be adjacent to the nerve/plexus identified and no ultrasound evidence of intravascular and/or intraneural injection Following insertion, dressing applied and Biopatch. Post procedure assessment: normal  Patient tolerated the procedure well with no immediate complications.

## 2018-07-03 NOTE — Plan of Care (Signed)
  Problem: Education: Goal: Knowledge of General Education information will improve Description Including pain rating scale, medication(s)/side effects and non-pharmacologic comfort measures Outcome: Progressing   Problem: Clinical Measurements: Goal: Ability to maintain clinical measurements within normal limits will improve Outcome: Progressing   Problem: Education: Goal: Will demonstrate proper wound care and an understanding of methods to prevent future damage Outcome: Progressing   Problem: Activity: Goal: Risk for activity intolerance will decrease Outcome: Progressing   Problem: Cardiac: Goal: Will achieve and/or maintain hemodynamic stability Outcome: Progressing   Problem: Clinical Measurements: Goal: Postoperative complications will be avoided or minimized Outcome: Progressing   Problem: Respiratory: Goal: Respiratory status will improve Outcome: Progressing

## 2018-07-03 NOTE — Anesthesia Postprocedure Evaluation (Signed)
Anesthesia Post Note  Patient: Kurt Diaz  Procedure(s) Performed: Coronary artery bypass grafting times three using left internal mammary artery and right greater saphenous vein harvested endoscopically. (N/A Chest) TRANSESOPHAGEAL ECHOCARDIOGRAM (TEE) (N/A )     Patient location during evaluation: ICU Anesthesia Type: General Level of consciousness: sedated Pain management: pain level controlled Vital Signs Assessment: post-procedure vital signs reviewed and stable Respiratory status: patient remains intubated per anesthesia plan Cardiovascular status: stable Postop Assessment: no apparent nausea or vomiting Anesthetic complications: no    Last Vitals:  Vitals:   07/03/18 1515 07/03/18 1528  BP: 122/82   Pulse:  80  Resp:  14  Temp:  (!) 36.2 C  SpO2:  100%    Last Pain:  Vitals:   07/03/18 1305  TempSrc: Core (Comment)  PainSc:                  Karyl Kinnier Robey Massmann

## 2018-07-03 NOTE — Anesthesia Procedure Notes (Signed)
Central Venous Catheter Insertion Performed by: Belinda Block, MD, anesthesiologist Start/End10/07/2018 6:40 AM, 07/03/2018 6:55 AM Preanesthetic checklist: patient identified, IV checked, site marked, risks and benefits discussed, surgical consent, monitors and equipment checked, pre-op evaluation and timeout performed Position: Trendelenburg Lidocaine 1% used for infiltration and patient sedated Hand hygiene performed , maximum sterile barriers used  and Seldinger technique used PA cath was placed.Swan type:thermodilution Procedure performed using ultrasound guided technique. Ultrasound Notes:anatomy identified and image(s) printed for medical record Following insertion, line sutured. Post procedure assessment: blood return through all ports  Patient tolerated the procedure well with no immediate complications.

## 2018-07-03 NOTE — Anesthesia Preprocedure Evaluation (Addendum)
Anesthesia Evaluation  Patient identified by MRN, date of birth, ID band Patient awake    Reviewed: Allergy & Precautions, NPO status , Patient's Chart, lab work & pertinent test results  Airway Mallampati: II  TM Distance: >3 FB Neck ROM: Full    Dental  (+) Chipped,    Pulmonary neg pulmonary ROS,    Pulmonary exam normal breath sounds clear to auscultation       Cardiovascular hypertension, Pt. on medications + angina + CAD and + Cardiac Stents  Normal cardiovascular exam Rhythm:Regular Rate:Normal  ECG: SB, rate 58  CATH: 1. Significant two-vessel coronary artery disease with 70% ostial LAD (FFR 0.77) stenosis, 80% mid RCA in-stent restenosis, and 70% distal RCA stenosis. 2. Normal left ventricular systolic function and filling pressure.   Neuro/Psych negative neurological ROS  negative psych ROS   GI/Hepatic negative GI ROS, Neg liver ROS,   Endo/Other  negative endocrine ROS  Renal/GU negative Renal ROS     Musculoskeletal negative musculoskeletal ROS (+)   Abdominal   Peds  Hematology HLD   Anesthesia Other Findings CAD  Reproductive/Obstetrics                            Anesthesia Physical Anesthesia Plan  ASA: IV  Anesthesia Plan: General   Post-op Pain Management:    Induction: Intravenous  PONV Risk Score and Plan: 2 and Midazolam and Treatment may vary due to age or medical condition  Airway Management Planned: Oral ETT  Additional Equipment: Arterial line, CVP, PA Cath, TEE and Ultrasound Guidance Line Placement  Intra-op Plan:   Post-operative Plan: Post-operative intubation/ventilation  Informed Consent: I have reviewed the patients History and Physical, chart, labs and discussed the procedure including the risks, benefits and alternatives for the proposed anesthesia with the patient or authorized representative who has indicated his/her understanding and  acceptance.   Dental advisory given  Plan Discussed with: CRNA  Anesthesia Plan Comments:         Anesthesia Quick Evaluation

## 2018-07-03 NOTE — Procedures (Signed)
Extubation Procedure Note  Patient Details:   Name: KAIUS DAINO DOB: Jul 26, 1941 MRN: [id number]   Airway Documentation:    Vent end date: 07/03/18 Vent end time: 1720   Evaluation  O2 sats: stable throughout Complications: No apparent complications Patient did tolerate procedure well. Bilateral Breath Sounds: Clear   Yes  Patient was extubated per cardiac wean to 2 LPM nasal cannula. Patient had -40 NIF and 1 L VC. Patient had positive cuff leak, strong productive cough, no stridor. BBS were clear; diminished. RN at bedside.    Daena Alper M 07/03/2018, 5:20 PM

## 2018-07-03 NOTE — Op Note (Signed)
CARDIOVASCULAR SURGERY OPERATIVE NOTE  07/03/2018  Surgeon:  Gaye Pollack, MD  First Assistant: Lars Pinks, PA-C   Preoperative Diagnosis:  Severe multi-vessel coronary artery disease   Postoperative Diagnosis:  Same   Procedure:  1. Median Sternotomy 2. Extracorporeal circulation 3.   Coronary artery bypass grafting x 3   Left internal mammary graft to the LAD  SVG to diagonal  SVG to RCA  4.   Endoscopic vein harvest from the right leg   Anesthesia:  General Endotracheal   Clinical History/Surgical Indication:   This 77 year old gentleman has significant two-vessel coronary artery disease status post prior stenting of the LAD and right coronary arteries following myocardial infarction and reported cardiac arrest in 2006. His current cardiac catheterization shows a 70% ostial LAD stenosis before the stents with an FFR of 0.77 which has decreased from 0.87 on the prior catheterization in February 2018. He also has an 80% mid RCA in-stent restenosis may compromise a relatively small distal right coronary artery. His symptoms are somewhat atypical in that they occur with rest but not with exertion. He has had a negative exercise tolerance test and negative nuclear stress tests in the past couple months. I think coronary bypass graft surgery would be beneficial given the degree of ostial LAD stenosis. I think this would prevent some significant problems down the road if that stenosis occluded. His best long-term prognosis will be with a left internal mammary graft to the LAD. I am not sure that his chest discomfort with rest is due to his coronary disease but it certainly could be. I reviewed the catheterization films with the patient and his wife and discussed all the above. I discussed the operative procedure with the patient and his wife including alternatives,  benefits and risks; including but not limited to bleeding, blood transfusion, infection, stroke, myocardial infarction, graft failure, heart block requiring a permanent pacemaker, organ dysfunction, and death.  Kurt Diaz understands and agrees to proceed.    Preparation:  The patient was seen in the preoperative holding area and the correct patient, correct operation were confirmed with the patient after reviewing the medical record and catheterization. The consent was signed by me. Preoperative antibiotics were given. A pulmonary arterial line and radial arterial line were placed by the anesthesia team. The patient was taken back to the operating room and positioned supine on the operating room table. After being placed under general endotracheal anesthesia by the anesthesia team a foley catheter was placed. The neck, chest, abdomen, and both legs were prepped with betadine soap and solution and draped in the usual sterile manner. A surgical time-out was taken and the correct patient and operative procedure were confirmed with the nursing and anesthesia staff.   Cardiopulmonary Bypass:  A median sternotomy was performed. The pericardium was opened in the midline. Right ventricular function appeared normal. The ascending aorta was of normal size and had no palpable plaque. There were no contraindications to aortic cannulation or cross-clamping. The patient was fully systemically heparinized and the ACT was maintained > 400 sec. The proximal aortic arch was cannulated with a 20 F aortic cannula for arterial inflow. Venous cannulation was performed via the right atrial appendage using a two-staged venous cannula. An antegrade cardioplegia/vent cannula was inserted into the mid-ascending aorta. Aortic occlusion was performed with a single cross-clamp. Systemic cooling to 32 degrees Centigrade and topical cooling of the heart with iced saline were used. Hyperkalemic antegrade cold blood cardioplegia was  used to induce diastolic  arrest and was then given at about 20 minute intervals throughout the period of arrest to maintain myocardial temperature at or below 10 degrees centigrade. A temperature probe was inserted into the interventricular septum and an insulating pad was placed in the pericardium.   Left internal mammary harvest:  The left side of the sternum was retracted using the Rultract retractor. The left internal mammary artery was harvested as a pedicle graft. All side branches were clipped. It was a medium-sized vessel of good quality with excellent blood flow. It was ligated distally and divided. It was sprayed with topical papaverine solution to prevent vasospasm.   Endoscopic vein harvest:  The right greater saphenous vein was harvested endoscopically through a 2 cm incision medial to the right knee. It was harvested from the upper thigh to below the knee. It was a medium-sized vein of good quality. The side branches were all ligated with 4-0 silk ties.    Coronary arteries:  The coronary arteries were examined.   LAD:  Large vessel with moderate distal disease. Diagonal is a medium sized vessel with no distal disease.  LCX:  No stenoses on cath. Large OM branches without distal disease.  RCA:  Diffusely diseased out into the distal vessel. There was one spot that was soft enough to graft.   Grafts:  1. LIMA to the LAD: 2.0 mm. It was sewn end to side using 8-0 prolene continuous suture. 2. SVG to diagonl:  1.75 mm. It was sewn end to side using 7-0 prolene continuous suture. 3. SVG to RCA:  2.0 mm. It was sewn end to side using 7-0 prolene continuous suture.   The proximal vein graft anastomoses were performed to the mid-ascending aorta using continuous 6-0 prolene suture. Graft markers were placed around the proximal anastomoses.   Completion:  The patient was rewarmed to 37 degrees Centigrade. The clamp was removed from the LIMA pedicle and there was rapid  warming of the septum and return of ventricular fibrillation. The crossclamp was removed with a time of 58 minutes. There was spontaneous return of sinus rhythm. The distal and proximal anastomoses were checked for hemostasis. The position of the grafts was satisfactory. Two temporary epicardial pacing wires were placed on the right atrium and two on the right ventricle. The patient was weaned from CPB without difficulty on no inotropes. CPB time was 76 minutes. Cardiac output was 5 LPM. TEE showed normal LV systolic function. Heparin was fully reversed with protamine and the aortic and venous cannulas removed. Hemostasis was achieved. Mediastinal and left pleural drainage tubes were placed. The sternum was closed with  #6 stainless steel wires. The fascia was closed with continuous # 1 vicryl suture. The subcutaneous tissue was closed with 2-0 vicryl continuous suture. The skin was closed with 3-0 vicryl subcuticular suture. All sponge, needle, and instrument counts were reported correct at the end of the case. Dry sterile dressings were placed over the incisions and around the chest tubes which were connected to pleurevac suction. The patient was then transported to the surgical intensive care unit in critical but stable condition.

## 2018-07-03 NOTE — Anesthesia Procedure Notes (Signed)
Procedure Name: Intubation Date/Time: 07/03/2018 7:57 AM Performed by: Alain Marion, CRNA Pre-anesthesia Checklist: Patient identified, Emergency Drugs available, Suction available, Patient being monitored and Timeout performed Patient Re-evaluated:Patient Re-evaluated prior to induction Oxygen Delivery Method: Circle system utilized Preoxygenation: Pre-oxygenation with 100% oxygen Induction Type: IV induction Ventilation: Mask ventilation without difficulty and Oral airway inserted - appropriate to patient size Laryngoscope Size: Sabra Heck and 2 Grade View: Grade I Tube type: Oral Tube size: 8.0 mm Number of attempts: 1 Airway Equipment and Method: Stylet Placement Confirmation: ETT inserted through vocal cords under direct vision,  positive ETCO2 and breath sounds checked- equal and bilateral Secured at: 22 cm Tube secured with: Tape Dental Injury: Teeth and Oropharynx as per pre-operative assessment

## 2018-07-03 NOTE — Transfer of Care (Signed)
Immediate Anesthesia Transfer of Care Note  Patient: Kurt Diaz  Procedure(s) Performed: Coronary artery bypass grafting times three using left internal mammary artery and right greater saphenous vein harvested endoscopically. (N/A Chest) TRANSESOPHAGEAL ECHOCARDIOGRAM (TEE) (N/A )  Patient Location: SICU/2H  Anesthesia Type:General  Level of Consciousness: sedated  Airway & Oxygen Therapy: Patient remains intubated per anesthesia plan  Post-op Assessment: Report given to RN and Post -op Vital signs reviewed and stable  Post vital signs: Reviewed and stable  Last Vitals:  Vitals Value Taken Time  BP    Temp 35.4 C 07/03/2018 12:54 PM  Pulse 80 07/03/2018 12:54 PM  Resp 12 07/03/2018 12:54 PM  SpO2 98 % 07/03/2018 12:54 PM  Vitals shown include unvalidated device data.  Last Pain:  Vitals:   07/03/18 0603  PainSc: 0-No pain         Complications: No apparent anesthesia complications

## 2018-07-03 NOTE — Progress Notes (Signed)
  Echocardiogram Echocardiogram Transesophageal has been performed.  Kurt Diaz 07/03/2018, 9:14 AM

## 2018-07-03 NOTE — Brief Op Note (Signed)
07/03/2018  10:49 AM  PATIENT:  Kurt Diaz  77 y.o. male  PRE-OPERATIVE DIAGNOSIS:  CAD  POST-OPERATIVE DIAGNOSIS:  CAD   PROCEDURE:  TRANSESOPHAGEAL ECHOCARDIOGRAM (TEE), MEDIAN STERNOTOMY for Coronary artery bypass grafting times three (LIMA to LAD, SVG to DIAGONAL, SVG to RCA) using left internal mammary artery and right greater saphenous vein harvested endoscopically.    SURGEON:  Surgeon(s) and Role:    Bartle, Fernande Boyden, MD - Primary  PHYSICIAN ASSISTANT: Lars Pinks PA-C  ANESTHESIA:   general  EBL:  As per anesthesia and perfusion record  DRAINS: Chest tubes placed in the mediastinal and pleural spaces   COUNTS CORRECT:  YES  DICTATION: .Dragon Dictation  PLAN OF CARE: Admit to inpatient   PATIENT DISPOSITION:  ICU - intubated and hemodynamically stable.   Delay start of Pharmacological VTE agent (>24hrs) due to surgical blood loss or risk of bleeding: yes  BASELINE WEIGHT: 89 kg

## 2018-07-03 NOTE — Interval H&P Note (Signed)
History and Physical Interval Note:  07/03/2018 7:15 AM  Kurt Diaz  has presented today for surgery, with the diagnosis of CAD  The various methods of treatment have been discussed with the patient and family. After consideration of risks, benefits and other options for treatment, the patient has consented to  Procedure(s): CORONARY ARTERY BYPASS GRAFTING (CABG) (N/A) TRANSESOPHAGEAL ECHOCARDIOGRAM (TEE) (N/A) as a surgical intervention .  The patient's history has been reviewed, patient examined, no change in status, stable for surgery.  I have reviewed the patient's chart and labs.  Questions were answered to the patient's satisfaction.     Gaye Pollack

## 2018-07-04 ENCOUNTER — Inpatient Hospital Stay (HOSPITAL_COMMUNITY): Payer: BLUE CROSS/BLUE SHIELD

## 2018-07-04 LAB — BASIC METABOLIC PANEL
Anion gap: 7 (ref 5–15)
BUN: 15 mg/dL (ref 8–23)
CO2: 23 mmol/L (ref 22–32)
Calcium: 7.8 mg/dL — ABNORMAL LOW (ref 8.9–10.3)
Chloride: 108 mmol/L (ref 98–111)
Creatinine, Ser: 0.98 mg/dL (ref 0.61–1.24)
GFR calc Af Amer: >60 mL/min (ref >60)
GFR calc non Af Amer: >60 mL/min (ref >60)
Glucose, Bld: 146 mg/dL — ABNORMAL HIGH (ref 70–99)
Potassium: 4.2 mmol/L (ref 3.5–5.1)
Sodium: 138 mmol/L (ref 135–145)

## 2018-07-04 LAB — CBC
HCT: 34.2 % — ABNORMAL LOW (ref 39.0–52.0)
HCT: 34.9 % — ABNORMAL LOW (ref 39.0–52.0)
Hemoglobin: 11.2 g/dL — ABNORMAL LOW (ref 13.0–17.0)
Hemoglobin: 11.2 g/dL — ABNORMAL LOW (ref 13.0–17.0)
MCH: 29.9 pg (ref 26.0–34.0)
MCH: 29.8 pg (ref 26.0–34.0)
MCHC: 32.7 g/dL (ref 30.0–36.0)
MCHC: 32.1 g/dL (ref 30.0–36.0)
MCV: 91.4 fL (ref 80.0–100.0)
MCV: 92.8 fL (ref 80.0–100.0)
Platelets: 170 10*3/uL (ref 150–400)
Platelets: 165 10*3/uL (ref 150–400)
RBC: 3.74 MIL/uL — ABNORMAL LOW (ref 4.22–5.81)
RBC: 3.76 MIL/uL — ABNORMAL LOW (ref 4.22–5.81)
RDW: 13.8 % (ref 11.5–15.5)
RDW: 14.1 % (ref 11.5–15.5)
WBC: 15.6 10*3/uL — ABNORMAL HIGH (ref 4.0–10.5)
WBC: 16.6 10*3/uL — ABNORMAL HIGH (ref 4.0–10.5)
nRBC: 0 % (ref 0.0–0.2)
nRBC: 0 % (ref 0.0–0.2)

## 2018-07-04 LAB — GLUCOSE, CAPILLARY
Glucose-Capillary: 142 mg/dL — ABNORMAL HIGH (ref 70–99)
Glucose-Capillary: 123 mg/dL — ABNORMAL HIGH (ref 70–99)

## 2018-07-04 LAB — POCT I-STAT, CHEM 8
BUN: 21 mg/dL (ref 8–23)
Calcium, Ion: 1.15 mmol/L (ref 1.15–1.40)
Chloride: 99 mmol/L (ref 98–111)
Creatinine, Ser: 0.9 mg/dL (ref 0.61–1.24)
Glucose, Bld: 123 mg/dL — ABNORMAL HIGH (ref 70–99)
HCT: 32 % — ABNORMAL LOW (ref 39.0–52.0)
Hemoglobin: 10.9 g/dL — ABNORMAL LOW (ref 13.0–17.0)
Potassium: 4 mmol/L (ref 3.5–5.1)
Sodium: 137 mmol/L (ref 135–145)
TCO2: 25 mmol/L (ref 22–32)

## 2018-07-04 LAB — CREATININE, SERUM
Creatinine, Ser: 0.94 mg/dL (ref 0.61–1.24)
GFR calc Af Amer: >60 mL/min (ref >60)
GFR calc non Af Amer: >60 mL/min (ref >60)

## 2018-07-04 LAB — MAGNESIUM
Magnesium: 2.4 mg/dL (ref 1.7–2.4)
Magnesium: 2.2 mg/dL (ref 1.7–2.4)

## 2018-07-04 MED ORDER — INSULIN ASPART 100 UNIT/ML ~~LOC~~ SOLN
0.0000 [IU] | Freq: Three times a day (TID) | SUBCUTANEOUS | Status: DC
  Administered 2018-07-04: 2 [IU] via SUBCUTANEOUS

## 2018-07-04 MED ORDER — ENOXAPARIN SODIUM 40 MG/0.4ML ~~LOC~~ SOLN
40.0000 mg | Freq: Every day | SUBCUTANEOUS | Status: DC
  Administered 2018-07-04 – 2018-07-06 (×3): 40 mg via SUBCUTANEOUS
  Filled 2018-07-04 (×3): qty 0.4

## 2018-07-04 NOTE — Progress Notes (Addendum)
[facility] DAILY ICU PROGRESS NOTE                   [address]            [place] [address]          [phone]   1 Day Post-Op Procedure(s) (LRB): Coronary artery bypass grafting times three using left internal mammary artery and right greater saphenous vein harvested endoscopically. (N/A) TRANSESOPHAGEAL ECHOCARDIOGRAM (TEE) (N/A)  Total Length of Stay:  LOS: 1 day   Subjective: Looks and feels well, no specific issues  Objective: Vital signs in last 24 hours: Temp:  [95.4 F (35.2 C)-99.9 F (37.7 C)] 98.6 F (37 C) (10/12 0900) Pulse Rate:  [77-93] 80 (10/12 0900) Cardiac Rhythm: Normal sinus rhythm (10/12 0800) Resp:  [10-29] 24 (10/12 0900) BP: (85-134)/(56-102) 129/64 (10/12 0840) SpO2:  [92 %-100 %] 96 % (10/12 0900) Arterial Line BP: (93-172)/(46-78) 135/57 (10/12 0900) FiO2 (%):  [40 %-50 %] 40 % (10/11 1647) Weight:  [91 kg] 91 kg (10/12 0530)  Filed Weights   07/04/18 0530  Weight: 91 kg    Weight change:    Hemodynamic parameters for last 24 hours: PAP: (16-60)/(7-25) 60/14 CO:  [3 L/min-6.1 L/min] 6.1 L/min CI:  [1.5 L/min/m2-3 L/min/m2] 3 L/min/m2  Intake/Output from previous day: 10/11 0701 - 10/12 0700 In: 4441.5 [P.O.:480; I.V.:2376.7; Blood:173; IV Piggyback:1411.8] Out: 4025 [Urine:2995; Blood:450; Chest Tube:580]  Intake/Output this shift: Total I/O In: 47.7 [I.V.:47.7] Out: 70 [Urine:60; Chest Tube:10]  Current Meds: Scheduled Meds: . acetaminophen  1,000 mg Oral Q6H   Or  . acetaminophen (TYLENOL) oral liquid 160 mg/5 mL  1,000 mg Per Tube Q6H  . aspirin EC  325 mg Oral Daily   Or  . aspirin  324 mg Per Tube Daily  . atorvastatin  80 mg Oral q1800  . bisacodyl  10 mg Oral Daily   Or  . bisacodyl  10 mg Rectal Daily  . Chlorhexidine Gluconate Cloth  6 each Topical Daily  . docusate sodium  200 mg Oral Daily  . insulin aspart  0-24 Units Subcutaneous Q4H  . mouth rinse  15 mL Mouth Rinse BID  . metoprolol  tartrate  12.5 mg Oral BID   Or  . metoprolol tartrate  12.5 mg Per Tube BID  . multivitamin with minerals  1 tablet Oral QPC supper  . [START ON 07/05/2018] pantoprazole  40 mg Oral Daily  . sodium chloride flush  10-40 mL Intracatheter Q12H  . sodium chloride flush  3 mL Intravenous Q12H   Continuous Infusions: . sodium chloride 20 mL/hr at 07/04/18 0800  . sodium chloride    . sodium chloride    . albumin human 12.5 g (07/03/18 1658)  . cefUROXime (ZINACEF)  IV 1.5 g (07/04/18 5188)  . dexmedetomidine (PRECEDEX) IV infusion Stopped (07/03/18 1615)  . lactated ringers    . lactated ringers    . lactated ringers 20 mL/hr at 07/04/18 0800  . nitroGLYCERIN 15 mcg/min (07/04/18 0800)  . phenylephrine (NEO-SYNEPHRINE) Adult infusion Stopped (07/03/18 1714)   PRN Meds:.sodium chloride, albumin human, lactated ringers, metoprolol tartrate, morphine injection, ondansetron (ZOFRAN) IV, oxyCODONE, sodium chloride flush, sodium chloride flush, traMADol  General appearance: alert, cooperative and no distress Heart: regular rate and rhythm Lungs: min dim in left base Abdomen: benign Extremities: no edema Wound: evh site ok, chest dresing CDI  Lab Results: CBC: Recent Labs    07/03/18 1823 07/04/18 0433  WBC  16.5* 15.6*  HGB 11.8* 11.2*  HCT 37.6* 34.2*  PLT 177 170   BMET:  Recent Labs    07/01/18 1134  07/03/18 1821 07/03/18 1823 07/04/18 0433  NA 138   < > 141  --  138  K 4.9   < > 4.5  --  4.2  CL 109   < > 108  --  108  CO2 21*  --   --   --  23  GLUCOSE 103*   < > 124*  --  146*  BUN 16   < > 15  --  15  CREATININE 0.89   < > 0.90 1.03 0.98  CALCIUM 9.0  --   --   --  7.8*   < > = values in this interval not displayed.    CMET: Lab Results  Component Value Date   WBC 15.6 (H) 07/04/2018   HGB 11.2 (L) 07/04/2018   HCT 34.2 (L) 07/04/2018   PLT 170 07/04/2018   GLUCOSE 146 (H) 07/04/2018   CHOL 99 (L) 12/16/2016   TRIG 75 12/16/2016   HDL 34 (L) 12/16/2016    LDLCALC 50 12/16/2016   ALT 22 07/01/2018   AST 30 07/01/2018   NA 138 07/04/2018   K 4.2 07/04/2018   CL 108 07/04/2018   CREATININE 0.98 07/04/2018   BUN 15 07/04/2018   CO2 23 07/04/2018   INR 1.31 07/03/2018   HGBA1C 6.2 (H) 07/01/2018      PT/INR:  Recent Labs    07/03/18 1245  LABPROT 16.2*  INR 1.31   Radiology: Dg Chest Port 1 View  Result Date: 07/04/2018 CLINICAL DATA:  Post CABG. EXAM: PORTABLE CHEST 1 VIEW COMPARISON:  07/03/2018 FINDINGS: Endotracheal tube and nasogastric tube have been removed. Swan-Ganz catheter isin stable position and located in the main pulmonary artery versus proximal right pulmonary artery. Left chest and mediastinal drains are stable in position. Negative for pneumothorax. Minimal atelectasis at the left lung base. Right lung is clear. Heart size is stable with post CABG changes. IMPRESSION: No acute findings. Support apparatuses as described.  Negative for a pneumothorax. Electronically Signed   By: Markus Daft M.D.   On: 07/04/2018 08:54   Dg Chest Port 1 View  Result Date: 07/03/2018 CLINICAL DATA:  Status post coronary bypass grafting EXAM: PORTABLE CHEST 1 VIEW COMPARISON:  07/01/2018 FINDINGS: Postsurgical changes are now seen. Endotracheal tube, nasogastric catheter and Swan-Ganz catheter are noted in satisfactory position. Mediastinal drain, left thoracostomy catheter and pericardial drain are noted as well. The lungs are hypoinflated without evidence of pneumothorax or focal infiltrate. No acute bony abnormality is noted. IMPRESSION: Tubes and lines as described above.  No acute abnormality noted. Electronically Signed   By: Inez Catalina M.D.   On: 07/03/2018 13:01     Assessment/Plan: S/P Procedure(s) (LRB): Coronary artery bypass grafting times three using left internal mammary artery and right greater saphenous vein harvested endoscopically. (N/A) TRANSESOPHAGEAL ECHOCARDIOGRAM (TEE) (N/A)  1 doing well, hemodyn stable in  sinus rhythm, nitro off this am 2 sats good on RA 3 renal fxn is normal, good UOP, does not appear significantly volume overloaded 4 minor ABL anemia 5 leukocytosis- monitor , improving trend, Prob mild SIRS 6 mild atx- routine pulm toilet and rehab,  7 Remove lines 8 mod CT drainage- monitor for now 9 see progression orders  John Giovanni PA-C 07/04/2018 9:09 AM   Pager [phone]  Expected Acute  Blood - loss  Anemia- continue to monitor  I have seen and examined Kurt Diaz and agree with the above assessment  and plan.  Grace Isaac MD Beeper [phone] Office [phone] 07/04/2018 9:39 AM

## 2018-07-04 NOTE — Progress Notes (Signed)
Patient ID: Kurt Diaz, male   DOB: 1941/07/17, 77 y.o.   MRN: [id number] EVENING ROUNDS NOTE :     [address]       [place] [address]             [phone]                 1 Day Post-Op Procedure(s) (LRB): Coronary artery bypass grafting times three using left internal mammary artery and right greater saphenous vein harvested endoscopically. (N/A) TRANSESOPHAGEAL ECHOCARDIOGRAM (TEE) (N/A)  Total Length of Stay:  LOS: 1 day  BP (!) 128/93   Pulse 79   Temp 98.7 F (37.1 C) (Oral)   Resp 19   Wt 91 kg   SpO2 94%   BMI 30.50 kg/m   .Intake/Output      10/12 0701 - 10/13 0700   P.O.    I.V. (mL/kg) 271 (3)   Blood    NG/GT    IV Piggyback 100   Total Intake(mL/kg) 371 (4.1)   Urine (mL/kg/hr) 460 (0.4)   Emesis/NG output    Blood    Chest Tube 140   Total Output 600   Net -229         . sodium chloride Stopped (07/04/18 6945)  . sodium chloride    . sodium chloride    . cefUROXime (ZINACEF)  IV Stopped (07/04/18 0388)  . dexmedetomidine (PRECEDEX) IV infusion Stopped (07/03/18 1615)  . lactated ringers    . lactated ringers    . lactated ringers 20 mL/hr at 07/04/18 1900  . nitroGLYCERIN Stopped (07/04/18 0829)  . phenylephrine (NEO-SYNEPHRINE) Adult infusion Stopped (07/03/18 1714)     Lab Results  Component Value Date   WBC 16.6 (H) 07/04/2018   HGB 10.9 (L) 07/04/2018   HCT 32.0 (L) 07/04/2018   PLT 165 07/04/2018   GLUCOSE 123 (H) 07/04/2018   CHOL 99 (L) 12/16/2016   TRIG 75 12/16/2016   HDL 34 (L) 12/16/2016   LDLCALC 50 12/16/2016   ALT 22 07/01/2018   AST 30 07/01/2018   NA 137 07/04/2018   K 4.0 07/04/2018   CL 99 07/04/2018   CREATININE 0.90 07/04/2018   BUN 21 07/04/2018   CO2 23 07/04/2018   INR 1.31 07/03/2018   HGBA1C 6.2 (H) 07/01/2018    Stable day   Grace Isaac MD  Beeper [phone] Office [phone] 07/04/2018 7:14 PM

## 2018-07-05 ENCOUNTER — Inpatient Hospital Stay (HOSPITAL_COMMUNITY): Payer: BLUE CROSS/BLUE SHIELD

## 2018-07-05 LAB — BASIC METABOLIC PANEL
Anion gap: 8 (ref 5–15)
BUN: 16 mg/dL (ref 8–23)
CO2: 24 mmol/L (ref 22–32)
Calcium: 8 mg/dL — ABNORMAL LOW (ref 8.9–10.3)
Chloride: 101 mmol/L (ref 98–111)
Creatinine, Ser: 0.9 mg/dL (ref 0.61–1.24)
GFR calc Af Amer: >60 mL/min (ref >60)
GFR calc non Af Amer: >60 mL/min (ref >60)
Glucose, Bld: 107 mg/dL — ABNORMAL HIGH (ref 70–99)
Potassium: 4 mmol/L (ref 3.5–5.1)
Sodium: 133 mmol/L — ABNORMAL LOW (ref 135–145)

## 2018-07-05 LAB — CBC
HCT: 33.1 % — ABNORMAL LOW (ref 39.0–52.0)
Hemoglobin: 10.8 g/dL — ABNORMAL LOW (ref 13.0–17.0)
MCH: 30.3 pg (ref 26.0–34.0)
MCHC: 32.6 g/dL (ref 30.0–36.0)
MCV: 93 fL (ref 80.0–100.0)
Platelets: 148 K/uL — ABNORMAL LOW (ref 150–400)
RBC: 3.56 MIL/uL — ABNORMAL LOW (ref 4.22–5.81)
RDW: 14 % (ref 11.5–15.5)
WBC: 15.8 K/uL — ABNORMAL HIGH (ref 4.0–10.5)
nRBC: 0 % (ref 0.0–0.2)

## 2018-07-05 LAB — GLUCOSE, CAPILLARY
Glucose-Capillary: 105 mg/dL — ABNORMAL HIGH (ref 70–99)
Glucose-Capillary: 110 mg/dL — ABNORMAL HIGH (ref 70–99)
Glucose-Capillary: 113 mg/dL — ABNORMAL HIGH (ref 70–99)
Glucose-Capillary: 114 mg/dL — ABNORMAL HIGH (ref 70–99)
Glucose-Capillary: 117 mg/dL — ABNORMAL HIGH (ref 70–99)
Glucose-Capillary: 94 mg/dL (ref 70–99)

## 2018-07-05 MED ORDER — INSULIN ASPART 100 UNIT/ML ~~LOC~~ SOLN: 0.0000 [IU] | Freq: Three times a day (TID) | SUBCUTANEOUS | Status: DC

## 2018-07-05 MED ORDER — MOVING RIGHT ALONG BOOK
Freq: Once | Status: AC
  Administered 2018-07-05: 15:00:00
  Filled 2018-07-05: qty 1

## 2018-07-05 MED ORDER — METOPROLOL TARTRATE 12.5 MG HALF TABLET
12.5000 mg | ORAL_TABLET | Freq: Two times a day (BID) | ORAL | Status: DC
  Administered 2018-07-05 – 2018-07-06 (×3): 12.5 mg via ORAL
  Filled 2018-07-05 (×3): qty 1

## 2018-07-05 MED ORDER — ONDANSETRON HCL 4 MG PO TABS: 4.0000 mg | ORAL_TABLET | Freq: Four times a day (QID) | ORAL | Status: DC | PRN

## 2018-07-05 MED ORDER — ACETAMINOPHEN 325 MG PO TABS
650.0000 mg | ORAL_TABLET | Freq: Four times a day (QID) | ORAL | Status: DC | PRN
  Administered 2018-07-06: 650 mg via ORAL
  Filled 2018-07-05: qty 2

## 2018-07-05 MED ORDER — BISACODYL 5 MG PO TBEC: 10.0000 mg | DELAYED_RELEASE_TABLET | Freq: Every day | ORAL | Status: DC | PRN

## 2018-07-05 MED ORDER — TRAMADOL HCL 50 MG PO TABS
50.0000 mg | ORAL_TABLET | ORAL | Status: DC | PRN
  Administered 2018-07-05: 50 mg via ORAL
  Administered 2018-07-06 – 2018-07-07 (×2): 100 mg via ORAL
  Filled 2018-07-05: qty 1
  Filled 2018-07-05 (×2): qty 2

## 2018-07-05 MED ORDER — ONDANSETRON HCL 4 MG/2ML IJ SOLN: 4.0000 mg | Freq: Four times a day (QID) | INTRAMUSCULAR | Status: DC | PRN

## 2018-07-05 MED ORDER — SODIUM CHLORIDE 0.9% FLUSH: 3.0000 mL | Freq: Two times a day (BID) | INTRAVENOUS | Status: DC

## 2018-07-05 MED ORDER — DOCUSATE SODIUM 100 MG PO CAPS: 200.0000 mg | ORAL_CAPSULE | Freq: Every day | ORAL | Status: DC

## 2018-07-05 MED ORDER — SODIUM CHLORIDE 0.9 % IV SOLN: 250.0000 mL | INTRAVENOUS | Status: DC | PRN

## 2018-07-05 MED ORDER — BISACODYL 10 MG RE SUPP: 10.0000 mg | Freq: Every day | RECTAL | Status: DC | PRN

## 2018-07-05 MED ORDER — ASPIRIN EC 325 MG PO TBEC
325.0000 mg | DELAYED_RELEASE_TABLET | Freq: Every day | ORAL | Status: DC
  Administered 2018-07-06: 325 mg via ORAL
  Filled 2018-07-05: qty 1

## 2018-07-05 MED ORDER — SODIUM CHLORIDE 0.9% FLUSH: 3.0000 mL | INTRAVENOUS | Status: DC | PRN

## 2018-07-05 MED ORDER — OXYCODONE HCL 5 MG PO TABS
5.0000 mg | ORAL_TABLET | ORAL | Status: DC | PRN
  Administered 2018-07-07: 5 mg via ORAL
  Filled 2018-07-05: qty 1
  Filled 2018-07-05: qty 2

## 2018-07-05 MED ORDER — ALUM & MAG HYDROXIDE-SIMETH 200-200-20 MG/5ML PO SUSP: 15.0000 mL | ORAL | Status: DC | PRN

## 2018-07-05 NOTE — Progress Notes (Signed)
Patient ID: Kurt Diaz, male   DOB: 03/12/1941, 77 y.o.   MRN: [id number] TCTS DAILY ICU PROGRESS NOTE                   [address]            [place] [address]          [phone]   2 Days Post-Op Procedure(s) (LRB): Coronary artery bypass grafting times three using left internal mammary artery and right greater saphenous vein harvested endoscopically. (N/A) TRANSESOPHAGEAL ECHOCARDIOGRAM (TEE) (N/A)  Total Length of Stay:  LOS: 2 days   Subjective: Feels well, walked around unit this am  Objective: Vital signs in last 24 hours: Temp:  [98.3 F (36.8 C)-99.1 F (37.3 C)] 98.4 F (36.9 C) (10/13 0356) Pulse Rate:  [70-82] 75 (10/13 0900) Cardiac Rhythm: Normal sinus rhythm (10/13 0800) Resp:  [13-26] 21 (10/13 0900) BP: (108-140)/(59-100) 123/65 (10/13 0900) SpO2:  [88 %-98 %] 94 % (10/13 0900) Weight:  [91.2 kg] 91.2 kg (10/13 0500)  Filed Weights   07/04/18 0530 07/05/18 0500  Weight: 91 kg 91.2 kg    Weight change: 0.2 kg   Hemodynamic parameters for last 24 hours:    Intake/Output from previous day: 10/12 0701 - 10/13 0700 In: 669.4 [I.V.:449.6; IV Piggyback:219.9] Out: 985 [Urine:825; Chest Tube:160]  Intake/Output this shift: Total I/O In: 319.8 [P.O.:240; I.V.:79.8] Out: 95 [Urine:95]  Current Meds: Scheduled Meds: . acetaminophen  1,000 mg Oral Q6H   Or  . acetaminophen (TYLENOL) oral liquid 160 mg/5 mL  1,000 mg Per Tube Q6H  . aspirin EC  325 mg Oral Daily   Or  . aspirin  324 mg Per Tube Daily  . atorvastatin  80 mg Oral q1800  . bisacodyl  10 mg Oral Daily   Or  . bisacodyl  10 mg Rectal Daily  . Chlorhexidine Gluconate Cloth  6 each Topical Daily  . docusate sodium  200 mg Oral Daily  . enoxaparin (LOVENOX) injection  40 mg Subcutaneous QHS  . insulin aspart  0-24 Units Subcutaneous TID WC  . mouth rinse  15 mL Mouth Rinse BID  . metoprolol tartrate  12.5 mg Oral BID   Or  . metoprolol tartrate  12.5 mg Per  Tube BID  . multivitamin with minerals  1 tablet Oral QPC supper  . pantoprazole  40 mg Oral Daily  . sodium chloride flush  10-40 mL Intracatheter Q12H  . sodium chloride flush  3 mL Intravenous Q12H   Continuous Infusions: . sodium chloride Stopped (07/04/18 0822)  . sodium chloride    . sodium chloride    . dexmedetomidine (PRECEDEX) IV infusion Stopped (07/03/18 1615)  . lactated ringers    . lactated ringers    . lactated ringers 20 mL/hr at 07/05/18 0900  . nitroGLYCERIN Stopped (07/04/18 0829)  . phenylephrine (NEO-SYNEPHRINE) Adult infusion Stopped (07/03/18 1714)   PRN Meds:.sodium chloride, lactated ringers, metoprolol tartrate, morphine injection, ondansetron (ZOFRAN) IV, oxyCODONE, sodium chloride flush, sodium chloride flush, traMADol  General appearance: alert and cooperative Neurologic: intact Heart: regular rate and rhythm, S1, S2 normal, no murmur, click, rub or gallop Lungs: diminished breath sounds bibasilar Abdomen: soft, non-tender; bowel sounds normal; no masses,  no organomegaly Extremities: extremities normal, atraumatic, no cyanosis or edema and Homans sign is negative, no sign of DVT Wound: stermum stable   Lab Results: CBC: Recent Labs    07/04/18 1622 07/04/18 1629 07/05/18 0422  WBC  16.6*  --  15.8*  HGB 11.2* 10.9* 10.8*  HCT 34.9* 32.0* 33.1*  PLT 165  --  148*   BMET:  Recent Labs    07/04/18 0433  07/04/18 1629 07/05/18 0422  NA 138  --  137 133*  K 4.2  --  4.0 4.0  CL 108  --  99 101  CO2 23  --   --  24  GLUCOSE 146*  --  123* 107*  BUN 15  --  21 16  CREATININE 0.98   < > 0.90 0.90  CALCIUM 7.8*  --   --  8.0*   < > = values in this interval not displayed.    CMET: Lab Results  Component Value Date   WBC 15.8 (H) 07/05/2018   HGB 10.8 (L) 07/05/2018   HCT 33.1 (L) 07/05/2018   PLT 148 (L) 07/05/2018   GLUCOSE 107 (H) 07/05/2018   CHOL 99 (L) 12/16/2016   TRIG 75 12/16/2016   HDL 34 (L) 12/16/2016   LDLCALC 50  12/16/2016   ALT 22 07/01/2018   AST 30 07/01/2018   NA 133 (L) 07/05/2018   K 4.0 07/05/2018   CL 101 07/05/2018   CREATININE 0.90 07/05/2018   BUN 16 07/05/2018   CO2 24 07/05/2018   INR 1.31 07/03/2018   HGBA1C 6.2 (H) 07/01/2018      PT/INR:  Recent Labs    07/03/18 1245  LABPROT 16.2*  INR 1.31   Radiology: Dg Chest Port 1 View  Result Date: 07/05/2018 CLINICAL DATA:  Follow-up of surgery. EXAM: PORTABLE CHEST 1 VIEW COMPARISON:  07/04/2018 FINDINGS: Removal of Swan-Ganz catheter with right IJ Cordis sheath remaining in place. The Cordis sheath is kinked in the low neck. Prior median sternotomy. Removal of mediastinal drain. One of 2 left chest tubes remains in place. Patient rotated right. Cardiomegaly accentuated by AP portable technique. Suspect a small left pleural effusion. No pneumothorax. Mild pulmonary venous congestion, superimposed upon low lung volumes. Developing left base atelectasis. IMPRESSION: Removal of 1 left chest tube, without pneumothorax. Slightly worsened aeration with development of mild pulmonary venous congestion and left base atelectasis. Electronically Signed   By: Abigail Miyamoto M.D.   On: 07/05/2018 08:38     Assessment/Plan: S/P Procedure(s) (LRB): Coronary artery bypass grafting times three using left internal mammary artery and right greater saphenous vein harvested endoscopically. (N/A) TRANSESOPHAGEAL ECHOCARDIOGRAM (TEE) (N/A) Mobilize Diuresis d/c tubes/lines Plan for transfer to step-down: see transfer orders Expected Acute  Blood - loss Anemia- continue to monitor      Grace Isaac 07/05/2018 10:03 AM

## 2018-07-05 NOTE — Progress Notes (Signed)
Report called to 4E, patient transferring to room 19. Report given to [facility]. Family is at bedside, all belongings accounted for.

## 2018-07-05 NOTE — Progress Notes (Signed)
Patient arrived from [facility] to 4E room 19 after CABG x3 07/03/18.  Telemetry monitor applied and CCMD notified.  Patient oriented to unit and room to include call light and phone.  Will continue to monitor.

## 2018-07-05 NOTE — Progress Notes (Signed)
1st attempt to call report. Left call back number, nurse was unavailable.

## 2018-07-06 ENCOUNTER — Encounter (HOSPITAL_COMMUNITY): Payer: Self-pay | Admitting: Surgery

## 2018-07-06 ENCOUNTER — Inpatient Hospital Stay (HOSPITAL_COMMUNITY): Payer: BLUE CROSS/BLUE SHIELD

## 2018-07-06 LAB — BASIC METABOLIC PANEL
Anion gap: 6 (ref 5–15)
BUN: 15 mg/dL (ref 8–23)
CO2: 27 mmol/L (ref 22–32)
Calcium: 8.2 mg/dL — ABNORMAL LOW (ref 8.9–10.3)
Chloride: 101 mmol/L (ref 98–111)
Creatinine, Ser: 0.99 mg/dL (ref 0.61–1.24)
GFR calc Af Amer: >60 mL/min (ref >60)
GFR calc non Af Amer: >60 mL/min (ref >60)
Glucose, Bld: 114 mg/dL — ABNORMAL HIGH (ref 70–99)
Potassium: 4.5 mmol/L (ref 3.5–5.1)
Sodium: 134 mmol/L — ABNORMAL LOW (ref 135–145)

## 2018-07-06 LAB — GLUCOSE, CAPILLARY
Glucose-Capillary: 109 mg/dL — ABNORMAL HIGH (ref 70–99)
Glucose-Capillary: 106 mg/dL — ABNORMAL HIGH (ref 70–99)
Glucose-Capillary: 111 mg/dL — ABNORMAL HIGH (ref 70–99)

## 2018-07-06 LAB — CBC
HCT: 32.3 % — ABNORMAL LOW (ref 39.0–52.0)
Hemoglobin: 10.7 g/dL — ABNORMAL LOW (ref 13.0–17.0)
MCH: 30.2 pg (ref 26.0–34.0)
MCHC: 33.1 g/dL (ref 30.0–36.0)
MCV: 91.2 fL (ref 80.0–100.0)
Platelets: 158 10*3/uL (ref 150–400)
RBC: 3.54 MIL/uL — ABNORMAL LOW (ref 4.22–5.81)
RDW: 13.6 % (ref 11.5–15.5)
WBC: 12.7 10*3/uL — ABNORMAL HIGH (ref 4.0–10.5)
nRBC: 0 % (ref 0.0–0.2)

## 2018-07-06 MED ORDER — LACTULOSE 10 GM/15ML PO SOLN: 20.0000 g | Freq: Once | ORAL | Status: DC

## 2018-07-06 MED ORDER — FUROSEMIDE 40 MG PO TABS
40.0000 mg | ORAL_TABLET | Freq: Every day | ORAL | Status: DC
  Administered 2018-07-06: 40 mg via ORAL
  Filled 2018-07-06: qty 1

## 2018-07-06 MED FILL — Magnesium Sulfate Inj 50%: INTRAMUSCULAR | Qty: 10 | Status: AC

## 2018-07-06 MED FILL — Gelatin Absorbable MT Powder: OROMUCOSAL | Qty: 1 | Status: AC

## 2018-07-06 MED FILL — Thrombin For Soln 5000 Unit: CUTANEOUS | Qty: 5000 | Status: AC

## 2018-07-06 MED FILL — Heparin Sodium (Porcine) Inj 1000 Unit/ML: INTRAMUSCULAR | Qty: 30 | Status: AC

## 2018-07-06 MED FILL — Potassium Chloride Inj 2 mEq/ML: INTRAVENOUS | Qty: 40 | Status: AC

## 2018-07-06 MED FILL — Thrombin For Soln Kit 20000 Unit: CUTANEOUS | Qty: 1 | Status: AC

## 2018-07-06 NOTE — Progress Notes (Addendum)
Pt ambulated in hall. Pt was able to walk about 1060 steps with no complications and room air. Front wheel walker used with stand by assist. Pt tolerated ambulation well. Pt up at sink brushing teeth and shaving. Will continue to monitor.

## 2018-07-06 NOTE — Discharge Instructions (Signed)
Coronary Artery Bypass Grafting, Care After °These instructions give you information on caring for yourself after your procedure. Your doctor may also give you more specific instructions. Call your doctor if you have any problems or questions after your procedure. °Follow these instructions at home: °· Only take medicine as told by your doctor. Take medicines exactly as told. Do not stop taking medicines or start any new medicines without talking to your doctor first. °· Take your pulse as told by your doctor. °· Do deep breathing as told by your doctor. Use your breathing device (incentive spirometer), if given, to practice deep breathing several times a day. Support your chest with a pillow or your arms when you take deep breaths or cough. °· Keep the area clean, dry, and protected where the surgery cuts (incisions) were made. Remove bandages (dressings) only as told by your doctor. If strips were applied to surgical area, do not take them off. They fall off on their own. °· Check the surgery area daily for puffiness (swelling), redness, or leaking fluid. °· If surgery cuts were made in your legs: °? Avoid crossing your legs. °? Avoid sitting for long periods of time. Change positions every 30 minutes. °? Raise your legs when you are sitting. Place them on pillows. °· Wear stockings that help keep blood clots from forming in your legs (compression stockings). °· Only take sponge baths until your doctor says it is okay to take showers. Pat the surgery area dry. Do not rub the surgery area with a washcloth or towel. Do not bathe, swim, or use a hot tub until your doctor says it is okay. °· Eat foods that are high in fiber. These include raw fruits and vegetables, whole grains, beans, and nuts. Choose lean meats. Avoid canned, processed, and fried foods. °· Drink enough fluids to keep your pee (urine) clear or pale yellow. °· Weigh yourself every day. °· Rest and limit activity as told by your doctor. You may be told  to: °? Stop any activity if you have chest pain, shortness of breath, changes in heartbeat, or dizziness. Get help right away if this happens. °? Move around often for short amounts of time or take short walks as told by your doctor. Gradually become more active. You may need help to strengthen your muscles and build endurance. °? Avoid lifting, pushing, or pulling anything heavier than 10 pounds (4.5 kg) for at least 6 weeks after surgery. °· Do not drive until your doctor says it is okay. °· Ask your doctor when you can go back to work. °· Ask your doctor when you can begin sexual activity again. °· Follow up with your doctor as told. °Contact a doctor if: °· You have puffiness, redness, more pain, or fluid draining from the incision site. °· You have a fever. °· You have puffiness in your ankles or legs. °· You have pain in your legs. °· You gain 2 or more pounds (0.9 kg) a day. °· You feel sick to your stomach (nauseous) or throw up (vomit). °· You have watery poop (diarrhea). °Get help right away if: °· You have chest pain that goes to your jaw or arms. °· You have shortness of breath. °· You have a fast or irregular heartbeat. °· You notice a "clicking" in your breastbone when you move. °· You have numbness or weakness in your arms or legs. °· You feel dizzy or light-headed. °This information is not intended to replace advice given to you by   your health care provider. Make sure you discuss any questions you have with your health care provider. °Document Released: 09/14/2013 Document Revised: 02/15/2016 Document Reviewed: 02/16/2013 °Elsevier Interactive Patient Education © 2017 [employer] ° °

## 2018-07-06 NOTE — Discharge Summary (Signed)
Physician Discharge Summary       [address]       [place] [address]             [phone]    Patient ID: Kurt Diaz MRN: [id number] DOB/AGE: [birth year] 77 y.o.  Admit date: 07/03/2018 Discharge date: 07/07/2018  Admission Diagnoses: Coronary artery disease  Discharge Diagnoses:  1. S/p  CABG x 3 2. ABL anemia 3. History of MI 01/11/2005 4. History of hyperlipidemia 5. History of hypertension 6. History of syncope and collapse   Procedure (s):  1. Median Sternotomy 2. Extracorporeal circulation 3.   Coronary artery bypass grafting x 3   Left internal mammary graft to the LAD  SVG to diagonal  SVG to RCA  4.   Endoscopic vein harvest from the right leg by Dr. Cyndia Bent on 07/03/2018.  History of Presenting Illness: The patient is a 77 year old gentleman with history of hypertension, hyperlipidemia, and coronary disease status post myocardial infarction in 01/11/2005 reportedly with sudden death and subsequent stenting of the LAD and right coronary arteries. He had recurrent chest discomfort in 01/11/17 and underwent repeat cardiac catheterization on 10/25/2016 showing an ostial 70% LAD stenosis with an FFR of 0.87. The proximal LAD stent was widely patent. There is moderate ostial to proximal RCA stenosis and severe distal RCA disease with a widely patent mid RCA stent. Left circumflex was widely patent. Left ventricular ejection fraction was 45 to 50% with mild mid anterior wall hypokinesis. The patient was treated with aspirin and Brilinta for 30 days and then switch to aspirin and Plavix. He had recurrent substernal chest pain getting on an airplane in April 2018 but did not get on the flight and came to the ER where he ruled out for myocardial infarction. He was started on Protonix for possible reflux. He had a nuclear stress test on 04/28/2017 which was a low risk study which showed a small defect in the apical lateral region that was reversible and was  felt either be diaphragmatic attenuation or very small area of ischemia. He was continued on medical therapy. He is continued to have some chest pains radiating to his jaw. These have not been relieved with nitroglycerin. He had an exercise tolerance test on 04/06/2018 which was negative. He had a nuclear stress test the following day which showed ejection fraction of 55 to 65%. There were no ST segment changes during stress and no signs of ischemia. He has continued to have atypical chest pain at rest. He stays active during the day and has no chest discomfort with exertion. He denies any shortness of breath. He has had no dizziness or syncope. He has had no peripheral edema. He had repeat cardiac catheterization on 06/08/2018 which showed a 70% ostial LAD stenosis with an FFR of 0.77. There was 80% mid RCA in-stent restenosis. There was 70% distal RCA stenosis. Left ventricular ejection fraction and filling pressures were normal.  This 77 year old gentleman has significant two-vessel coronary artery disease status post prior stenting of the LAD and right coronary arteries following myocardial infarction and reported cardiac arrest in 01-11-05. His current cardiac catheterization shows a 70% ostial LAD stenosis before the stents with an FFR of 0.77 which has decreased from 0.87 on the prior catheterization in February 2018. He also has an 80% mid RCA in-stent restenosis may compromise a relatively small distal right coronary artery. His symptoms are somewhat atypical in that they occur with rest but not with exertion. He has  had a negative exercise tolerance test and negative nuclear stress tests in the past couple months. Dr. Cyndia Bent thought coronary bypass graft surgery would be beneficial given the degree of ostial LAD stenosis. Potential risks, benefits, and complications of the surgery were discussed with the patient and he agreed to proceed with surgery. Pre operative carotid duplex US showed    no significant internal carotid artery stenosis bilaterally. Patient underwent a CABG x 3 on 07/03/2018.  Brief Hospital Course:  The patient was extubated the evening of surgery without difficulty. He remained afebrile and hemodynamically stable. He was weaned off Nitro drip. Gordy Councilman, a line, chest tubes, and foley were removed early in the post operative course. Lopressor was started and titrated accordingly. He was volume over loaded and diuresed. He had ABL anemia. He did not require a post op transfusion. Last H and H was 10.7 and 32.3 . He was weaned off the insulin drip.  The patient's glucose remained well controlled.The patient's HGA1C pre op was 6.2 . The patient was felt surgically stable for transfer from the ICU to PCTU for further convalescence on 07/05/2018. He continues to progress with cardiac rehab. He was ambulating on room air. He has been tolerating a diet and has had a bowel movement. Epicardial pacing wires were removed on 07/06/2018. Chest tube sutures will be removed in the office after discharge. The patient is felt surgically stable for discharge today.   Latest Vital Signs: Blood pressure 133/79, pulse 72, temperature 98.2 F (36.8 C), temperature source Oral, resp. rate 17, weight 88.3 kg, SpO2 96 %.  Physical Exam: Cardiovascular: RRR Pulmonary: Clear to auscultation bilaterally Abdomen: Soft, non tender, bowel sounds present. Extremities: Trace bilateral lower extremity edema. Wounds: Clean and dry.  No erythema or signs of infection.  Discharge Condition: Stable and discharged to home.  Recent laboratory studies:  Lab Results  Component Value Date   WBC 12.7 (H) 07/06/2018   HGB 10.7 (L) 07/06/2018   HCT 32.3 (L) 07/06/2018   MCV 91.2 07/06/2018   PLT 158 07/06/2018   Lab Results  Component Value Date   NA 134 (L) 07/06/2018   K 4.5 07/06/2018   CL 101 07/06/2018   CO2 27 07/06/2018   CREATININE 0.99 07/06/2018   GLUCOSE 114 (H) 07/06/2018        Diagnostic Studies: Dg Chest 2 View  Result Date: 07/06/2018 CLINICAL DATA:  Status post CABG on July 03, 2018 EXAM: CHEST - 2 VIEW COMPARISON:  Chest x-ray of July 03, 2018. FINDINGS: The lungs are adequately inflated. Left basilar density has improved. Perihilar subsegmental atelectasis has also improved slightly. There small bilateral pleural effusions. There is no pneumothorax. The heart is top-normal in size. The pulmonary vascularity is normal. The mediastinum is normal in width. The sternal wires are intact. The right internal jugular Cordis sheath has been removed as has the left chest tube. IMPRESSION: Small bilateral pleural effusions. Persistent left lower lobe atelectasis as well as subsegmental hilar atelectasis bilaterally. No pulmonary edema or pneumothorax. Electronically Signed   By: David  Martinique M.D.   On: 07/06/2018 07:47   Dg Chest 2 View  Result Date: 07/01/2018 CLINICAL DATA:  Preoperative examination prior CABG.  Nonsmoker. EXAM: CHEST - 2 VIEW COMPARISON:  PA and lateral chest x-ray of May 25, 2018 FINDINGS: The lungs are adequately inflated and clear. The heart and pulmonary vascularity are normal. The mediastinum is normal in width. The trachea is midline. There is no pleural effusion. The bony  thorax exhibits no acute abnormality. IMPRESSION: There is no active cardiopulmonary disease. Electronically Signed   By: David  Martinique M.D.   On: 07/01/2018 16:40   Dg Chest Port 1 View  Result Date: 07/05/2018 CLINICAL DATA:  Follow-up of surgery. EXAM: PORTABLE CHEST 1 VIEW COMPARISON:  07/04/2018 FINDINGS: Removal of Swan-Ganz catheter with right IJ Cordis sheath remaining in place. The Cordis sheath is kinked in the low neck. Prior median sternotomy. Removal of mediastinal drain. One of 2 left chest tubes remains in place. Patient rotated right. Cardiomegaly accentuated by AP portable technique. Suspect a small left pleural effusion. No pneumothorax. Mild pulmonary  venous congestion, superimposed upon low lung volumes. Developing left base atelectasis. IMPRESSION: Removal of 1 left chest tube, without pneumothorax. Slightly worsened aeration with development of mild pulmonary venous congestion and left base atelectasis. Electronically Signed   By: Abigail Miyamoto M.D.   On: 07/05/2018 08:38   Dg Chest Port 1 View  Result Date: 07/04/2018 CLINICAL DATA:  Post CABG. EXAM: PORTABLE CHEST 1 VIEW COMPARISON:  07/03/2018 FINDINGS: Endotracheal tube and nasogastric tube have been removed. Swan-Ganz catheter isin stable position and located in the main pulmonary artery versus proximal right pulmonary artery. Left chest and mediastinal drains are stable in position. Negative for pneumothorax. Minimal atelectasis at the left lung base. Right lung is clear. Heart size is stable with post CABG changes. IMPRESSION: No acute findings. Support apparatuses as described.  Negative for a pneumothorax. Electronically Signed   By: Markus Daft M.D.   On: 07/04/2018 08:54   Dg Chest Port 1 View  Result Date: 07/03/2018 CLINICAL DATA:  Status post coronary bypass grafting EXAM: PORTABLE CHEST 1 VIEW COMPARISON:  07/01/2018 FINDINGS: Postsurgical changes are now seen. Endotracheal tube, nasogastric catheter and Swan-Ganz catheter are noted in satisfactory position. Mediastinal drain, left thoracostomy catheter and pericardial drain are noted as well. The lungs are hypoinflated without evidence of pneumothorax or focal infiltrate. No acute bony abnormality is noted. IMPRESSION: Tubes and lines as described above.  No acute abnormality noted. Electronically Signed   By: Inez Catalina M.D.   On: 07/03/2018 13:01    Discharge Medications: Allergies as of 07/07/2018   No Known Allergies     Medication List    STOP taking these medications   ibuprofen 200 MG tablet Commonly known as:  ADVIL,MOTRIN   isosorbide mononitrate 30 MG 24 hr tablet Commonly known as:  IMDUR   nitroGLYCERIN  0.4 MG SL tablet Commonly known as:  NITROSTAT     TAKE these medications   acetaminophen 325 MG tablet Commonly known as:  TYLENOL Take 2 tablets (650 mg total) by mouth every 6 (six) hours as needed for mild pain.   aspirin 325 MG EC tablet Take 1 tablet (325 mg total) by mouth daily. What changed:    medication strength  how much to take  when to take this   atorvastatin 80 MG tablet Commonly known as:  LIPITOR Take 1 tablet (80 mg total) by mouth daily at 6 PM.   furosemide 40 MG tablet Commonly known as:  LASIX Take 1 tablet (40 mg total) by mouth daily. For 5 days then stop.   lisinopril 2.5 MG tablet Commonly known as:  PRINIVIL,ZESTRIL Take 1 tablet (2.5 mg total) by mouth daily. What changed:    medication strength  how much to take  when to take this   metoprolol tartrate 25 MG tablet Commonly known as:  LOPRESSOR Take 0.5 tablets (12.5 mg  total) by mouth 2 (two) times daily.   multivitamin with minerals Tabs tablet Take 1 tablet by mouth daily after supper.   potassium chloride 10 MEQ tablet Commonly known as:  K-DUR,KLOR-CON Take 1 tablet (10 mEq total) by mouth daily. For 5 days then stop.   traMADol 50 MG tablet Commonly known as:  ULTRAM Take 50 mg by mouth every 4-6 hours PRN sever pain      The patient has been discharged on:   1.Beta Blocker:  Yes [  x ]                              No   [   ]                              If No, reason:  2.Ace Inhibitor/ARB: Yes [  x ]                                     No  [    ]                                     If No, reason:  3.Statin:   Yes [  x ]                  No  [   ]                  If No, reason:  4.Ecasa:  Yes  [ x  ]                  No   [   ]                  If No, reason:  Follow Up Appointments: Follow-up Information    Gaye Pollack, MD. Go on 08/05/2018.   Specialty:  Cardiothoracic Surgery Why:  PA/LAT CXR to be taken (at [facility] which is in the  same building as Dr. Vivi Martens office) on 08/05/2018 at 2:00 pm; Appointment time is at 2:30 pm Contact information: [address] [place] [address] [phone]        Nurse. Go on 07/16/2018.   Why:  Appointment time is at 10:00 am and is with nurse only to have chest tube sutures removed Contact information: [address] [place] [address]       Josue Hector, MD .   Specialty:  Cardiology Contact information: [address] [place] [address] [phone]           Signed: Sharalyn Ink [facility] 07/07/2018, 8:05 AM

## 2018-07-06 NOTE — Progress Notes (Signed)
CARDIAC REHAB PHASE I   PRE:  Rate/Rhythm: 76 SR  BP:  Supine:   Sitting: 151/77  Standing:    SaO2: 97%RA  MODE:  Ambulation: 470 ft   POST:  Rate/Rhythm: 94 SR  BP:  Supine:   Sitting: 146/79  Standing:    SaO2: 93%RA 1353-1410 Pt stated he walked 940 ft this morning with the walker. Tried pt without walker to see how he would tolerate. Pt walked 470 ft with hand held asst and was steady. Walk was a little more effort without walker so pt did not make 2 laps just one lap. Does not need walker for home use. Will ed with pt and wife tomorrow.   Graylon Good, RN BSN  07/06/2018 2:06 PM

## 2018-07-06 NOTE — Progress Notes (Addendum)
      [address]       [place] [address]             [phone]        3 Days Post-Op Procedure(s) (LRB): Coronary artery bypass grafting times three using left internal mammary artery and right greater saphenous vein harvested endoscopically. (N/A) TRANSESOPHAGEAL ECHOCARDIOGRAM (TEE) (N/A)  Subjective: Patient passing flatus but no bowel movement yet.  Objective: Vital signs in last 24 hours: Temp:  [98.1 F (36.7 C)-99.4 F (37.4 C)] 98.1 F (36.7 C) (10/14 0422) Pulse Rate:  [73-94] 94 (10/14 0422) Cardiac Rhythm: Normal sinus rhythm (10/13 1902) Resp:  [16-26] 21 (10/14 0422) BP: (111-150)/(61-100) 134/71 (10/14 0422) SpO2:  [90 %-98 %] 92 % (10/14 0422) Weight:  [91 kg] 91 kg (10/14 0422)  Pre op weight 91 kg Current Weight  07/06/18 91 kg       Intake/Output from previous day: 10/13 0701 - 10/14 0700 In: 428 [P.O.:240; I.V.:88; IV Piggyback:100] Out: 1822 [Urine:1822]   Physical Exam:  Cardiovascular: RRR Pulmonary: Clear to auscultation bilaterally Abdomen: Soft, non tender, bowel sounds present. Extremities: Trace bilateral lower extremity edema. Wounds: Clean and dry.  No erythema or signs of infection.  Lab Results: CBC: Recent Labs    07/05/18 0422 07/06/18 0259  WBC 15.8* 12.7*  HGB 10.8* 10.7*  HCT 33.1* 32.3*  PLT 148* 158   BMET:  Recent Labs    07/05/18 0422 07/06/18 0259  NA 133* 134*  K 4.0 4.5  CL 101 101  CO2 24 27  GLUCOSE 107* 114*  BUN 16 15  CREATININE 0.90 0.99  CALCIUM 8.0* 8.2*    PT/INR:  Lab Results  Component Value Date   INR 1.31 07/03/2018   INR 1.00 07/01/2018   INR 0.9 10/24/2016   ABG:  INR: Will add last result for INR, ABG once components are confirmed Will add last 4 CBG results once components are confirmed  Assessment/Plan:  1. CV - SR. On Lopressor 12.5 mg bid 2.  Pulmonary - On room air. CXR this am appears stable (cardiomegaly, small left pleural effusion and  atelectasis).Encourage incentive spirometer 3. Volume Overload - Will give Lasix 40 mg daily 4.  Acute blood loss anemia - H and H 10.7 and 32.3 5. CBGs 114/105/111. Pre op HGA1C is 6.2. He likely has pre diabetes. Will provide nutrition information and he will need to follow up with his medical doctor. Stop accu checks and SS PRN 6. Remove EPW 7. LOC constipation 8. Hopefully, home in am  Donielle M ZimmermanPA-C 07/06/2018,7:13 AM [phone]  I have seen and examined the patient and agree with the assessment and plan as outlined.  Making good progress.  Rexene Alberts, MD 07/06/2018 4:51 PM

## 2018-07-07 MED ORDER — ASPIRIN 325 MG PO TBEC: 325.0000 mg | DELAYED_RELEASE_TABLET | Freq: Every day | ORAL | 0 refills | Status: DC

## 2018-07-07 MED ORDER — METOPROLOL TARTRATE 25 MG PO TABS: 12.5000 mg | ORAL_TABLET | Freq: Two times a day (BID) | ORAL | 1 refills | Status: DC

## 2018-07-07 MED ORDER — TRAMADOL HCL 50 MG PO TABS: ORAL_TABLET | ORAL | 0 refills | Status: DC

## 2018-07-07 MED ORDER — ACETAMINOPHEN 325 MG PO TABS: 650.0000 mg | ORAL_TABLET | Freq: Four times a day (QID) | ORAL | Status: AC | PRN

## 2018-07-07 MED ORDER — FUROSEMIDE 40 MG PO TABS: 40.0000 mg | ORAL_TABLET | Freq: Every day | ORAL | 0 refills | Status: DC

## 2018-07-07 MED ORDER — LISINOPRIL 2.5 MG PO TABS: 2.5000 mg | ORAL_TABLET | Freq: Every day | ORAL | Status: DC

## 2018-07-07 MED ORDER — LISINOPRIL 2.5 MG PO TABS: 2.5000 mg | ORAL_TABLET | Freq: Every day | ORAL | 1 refills | Status: DC

## 2018-07-07 MED ORDER — POTASSIUM CHLORIDE CRYS ER 10 MEQ PO TBCR: 10.0000 meq | EXTENDED_RELEASE_TABLET | Freq: Every day | ORAL | Status: DC

## 2018-07-07 MED ORDER — POTASSIUM CHLORIDE CRYS ER 10 MEQ PO TBCR: 10.0000 meq | EXTENDED_RELEASE_TABLET | Freq: Every day | ORAL | 0 refills | Status: DC

## 2018-07-07 MED ORDER — LISINOPRIL 2.5 MG PO TABS: 2.5000 mg | ORAL_TABLET | Freq: Two times a day (BID) | ORAL | 1 refills | Status: DC

## 2018-07-07 MED FILL — Heparin Sodium (Porcine) Inj 1000 Unit/ML: INTRAMUSCULAR | Qty: 10 | Status: AC

## 2018-07-07 MED FILL — Sodium Bicarbonate IV Soln 8.4%: INTRAVENOUS | Qty: 50 | Status: AC

## 2018-07-07 MED FILL — Electrolyte-R (PH 7.4) Solution: INTRAVENOUS | Qty: 4000 | Status: AC

## 2018-07-07 MED FILL — Lidocaine HCl(Cardiac) IV PF Soln Pref Syr 100 MG/5ML (2%): INTRAVENOUS | Qty: 5 | Status: AC

## 2018-07-07 MED FILL — Sodium Chloride IV Soln 0.9%: INTRAVENOUS | Qty: 2000 | Status: AC

## 2018-07-07 MED FILL — Mannitol IV Soln 20%: INTRAVENOUS | Qty: 500 | Status: AC

## 2018-07-07 NOTE — Progress Notes (Signed)
[phone] Education completed with pt and wife who voiced understanding. Reviewed staying in the tube and sternal precautions. Discussed carb counting since A1C at 6. 2. Gave carb counting and diabetic diets. Gave ex ed and discussed CRP 2. Referred to [facility] program. Wrote down how to view discharge video. Encouraged IS. Graylon Good RN BSN 07/07/2018 10:16 AM

## 2018-07-07 NOTE — Progress Notes (Addendum)
      [address]       [place] [address]             [phone]        4 Days Post-Op Procedure(s) (LRB): Coronary artery bypass grafting times three using left internal mammary artery and right greater saphenous vein harvested endoscopically. (N/A) TRANSESOPHAGEAL ECHOCARDIOGRAM (TEE) (N/A)  Subjective: Patient had a bowel movement. He slept well. No specific complaints this am. He would like to go home.  Objective: Vital signs in last 24 hours: Temp:  [98.2 F (36.8 C)-99.1 F (37.3 C)] 98.2 F (36.8 C) (10/15 0517) Pulse Rate:  [70-89] 72 (10/15 0517) Cardiac Rhythm: Normal sinus rhythm (10/14 1900) Resp:  [17-23] 17 (10/15 0517) BP: (121-151)/(59-79) 133/79 (10/15 0517) SpO2:  [93 %-96 %] 96 % (10/15 0517) Weight:  [88.3 kg] 88.3 kg (10/15 0517)  Pre op weight 91 kg Current Weight  07/07/18 88.3 kg       Intake/Output from previous day: 10/14 0701 - 10/15 0700 In: 390 [P.O.:390] Out: 300 [Urine:300]   Physical Exam:  Cardiovascular: RRR Pulmonary: Clear to auscultation bilaterally Abdomen: Soft, non tender, bowel sounds present. Extremities: Trace bilateral lower extremity edema. Wounds: Clean and dry.  No erythema or signs of infection.  Lab Results: CBC: Recent Labs    07/05/18 0422 07/06/18 0259  WBC 15.8* 12.7*  HGB 10.8* 10.7*  HCT 33.1* 32.3*  PLT 148* 158   BMET:  Recent Labs    07/05/18 0422 07/06/18 0259  NA 133* 134*  K 4.0 4.5  CL 101 101  CO2 24 27  GLUCOSE 107* 114*  BUN 16 15  CREATININE 0.90 0.99  CALCIUM 8.0* 8.2*    PT/INR:  Lab Results  Component Value Date   INR 1.31 07/03/2018   INR 1.00 07/01/2018   INR 0.9 10/24/2016   ABG:  INR: Will add last result for INR, ABG once components are confirmed Will add last 4 CBG results once components are confirmed  Assessment/Plan:  1. CV - SR. On Lopressor 12.5 mg bid. Will restart low dose Lisinopril for better BP control. 2.  Pulmonary - On  room air. CXR this am appears stable (cardiomegaly, small left pleural effusion and atelectasis).Encourage incentive spirometer 3. Volume Overload - On Lasix 40 mg daily.  4.  Acute blood loss anemia - H and H 10.7 and 32.3 5. Chest tube sutures to remain-will remove in office 6. Discharge  Donielle M ZimmermanPA-C 07/07/2018,7:12 AM [phone]    I have seen and examined the patient and agree with the assessment and plan as outlined.  Rexene Alberts, MD 07/07/2018 10:03 AM

## 2018-07-08 ENCOUNTER — Telehealth (HOSPITAL_COMMUNITY): Payer: Self-pay

## 2018-07-08 NOTE — Telephone Encounter (Signed)
Attempted to make initial call to patient in regards to Cardiac Rehab - lm on vm °

## 2018-07-08 NOTE — Telephone Encounter (Signed)
Patients insurance is active and benefits verified through [place] - No co-pay, deductible amount of $450.00/$450.00 has been met, out of pocket amount of $2,800/$2,800 has been met, no co-insurance, and no pre-authorization is required. Passport/reference [phone]  Patients insurance is active and benefits verified through Medicare A/B - No co-pay, deductible amount of $185.00/$185.00 has been met, no out of pocket, 20% co-insurance, and no pre-authorization is required. Passport/reference [phone]  Will contact patient in regards to Cardiac Rehab. If interested, patient will need to complete follow up appt. Once completed, patient will be contacted for scheduling.

## 2018-07-14 ENCOUNTER — Telehealth: Payer: Self-pay | Admitting: *Deleted

## 2018-07-14 NOTE — Telephone Encounter (Signed)
Mr. Madlock had a CABG X 3 on 07/03/18 and was discharged on 07/07/18.  He has called this morning to relate symptoms of fatigue, feeling his heart throbbing/pounding and feeling dizzy when he gets up. Also, his blood pressure is lower than usual...from the normal 130's to the 110's and lower. He has just finished a course of Lasix/K. There is no n/v, chest pains and /or diaphoresis.  I felt that he may be somewhat dehydrated and needs to increase his fluids. I suggested this and he agreed. I will call him later today to check on his progress. If there are still concerns, I will consult with Dr. Cyndia Bent.

## 2018-07-17 ENCOUNTER — Other Ambulatory Visit: Payer: Self-pay

## 2018-07-17 ENCOUNTER — Ambulatory Visit (INDEPENDENT_AMBULATORY_CARE_PROVIDER_SITE_OTHER): Payer: Self-pay

## 2018-07-17 DIAGNOSIS — Z4802 Encounter for removal of sutures: Secondary | ICD-10-CM

## 2018-07-17 NOTE — Progress Notes (Signed)
Patient arrived for nurse visit to remove sutures post- procedure CABG 07/03/18 with Dr. Cyndia Bent.  3 Sutures removed with no signs/ symptoms of infection noted.  Patient tolerated procedure well.  Patient/ family instructed to keep the incision sites clean and dry.  Incisions looked to be healing nicely.  Patient/ family acknowledged instructions given.

## 2018-07-23 ENCOUNTER — Telehealth (HOSPITAL_COMMUNITY): Payer: Self-pay | Admitting: *Deleted

## 2018-07-23 NOTE — Telephone Encounter (Signed)
Pt had heart bypass three weeks ago. Pt has upcoming cardiologist appt on 10/8.  Pt advised that once the appt has been completed a review for readiness will occur.  Pt will then be contacted to schedule to begin after seen by the surgeon. Reviewed insurance benefits and we are scheduling for new patients for the end of December.  Pt assured we do have a cancellation list and may be able to get him in sooner.  Pt appreciaed the information. Cherre Huger, BSN Cardiac and Training and development officer

## 2018-07-23 NOTE — Telephone Encounter (Signed)
Pt called and wanted to know how to get schedule for Cardiac Rehab.

## 2018-07-28 ENCOUNTER — Telehealth: Payer: Self-pay

## 2018-07-28 ENCOUNTER — Ambulatory Visit: Payer: BLUE CROSS/BLUE SHIELD | Admitting: Cardiology

## 2018-07-28 ENCOUNTER — Encounter (HOSPITAL_COMMUNITY): Payer: Self-pay | Admitting: Emergency Medicine

## 2018-07-28 ENCOUNTER — Emergency Department (HOSPITAL_COMMUNITY)
Admission: EM | Admit: 2018-07-28 | Discharge: 2018-07-28 | Disposition: A | Discharge status: Home / Self Care | Payer: BLUE CROSS/BLUE SHIELD | Attending: Emergency Medicine | Admitting: Emergency Medicine

## 2018-07-28 ENCOUNTER — Other Ambulatory Visit: Payer: Self-pay

## 2018-07-28 ENCOUNTER — Emergency Department (HOSPITAL_COMMUNITY): Payer: BLUE CROSS/BLUE SHIELD

## 2018-07-28 DIAGNOSIS — R9431 Abnormal electrocardiogram [ECG] [EKG]: Secondary | ICD-10-CM | POA: Diagnosis not present

## 2018-07-28 DIAGNOSIS — R0781 Pleurodynia: Secondary | ICD-10-CM

## 2018-07-28 DIAGNOSIS — Z951 Presence of aortocoronary bypass graft: Secondary | ICD-10-CM | POA: Insufficient documentation

## 2018-07-28 DIAGNOSIS — Z7982 Long term (current) use of aspirin: Secondary | ICD-10-CM | POA: Diagnosis not present

## 2018-07-28 DIAGNOSIS — Z8546 Personal history of malignant neoplasm of prostate: Secondary | ICD-10-CM | POA: Diagnosis not present

## 2018-07-28 DIAGNOSIS — I2510 Atherosclerotic heart disease of native coronary artery without angina pectoris: Secondary | ICD-10-CM | POA: Diagnosis not present

## 2018-07-28 DIAGNOSIS — J90 Pleural effusion, not elsewhere classified: Secondary | ICD-10-CM | POA: Diagnosis not present

## 2018-07-28 DIAGNOSIS — R071 Chest pain on breathing: Secondary | ICD-10-CM | POA: Diagnosis not present

## 2018-07-28 DIAGNOSIS — I10 Essential (primary) hypertension: Secondary | ICD-10-CM | POA: Diagnosis not present

## 2018-07-28 DIAGNOSIS — Z79899 Other long term (current) drug therapy: Secondary | ICD-10-CM | POA: Insufficient documentation

## 2018-07-28 DIAGNOSIS — R079 Chest pain, unspecified: Secondary | ICD-10-CM | POA: Diagnosis not present

## 2018-07-28 LAB — CBC
HCT: 45.5 % (ref 39.0–52.0)
Hemoglobin: 14.3 g/dL (ref 13.0–17.0)
MCH: 29.3 pg (ref 26.0–34.0)
MCHC: 31.4 g/dL (ref 30.0–36.0)
MCV: 93.2 fL (ref 80.0–100.0)
Platelets: 314 10*3/uL (ref 150–400)
RBC: 4.88 MIL/uL (ref 4.22–5.81)
RDW: 14 % (ref 11.5–15.5)
WBC: 12.1 10*3/uL — ABNORMAL HIGH (ref 4.0–10.5)
nRBC: 0 % (ref 0.0–0.2)

## 2018-07-28 LAB — I-STAT TROPONIN, ED
Troponin i, poc: 0 ng/mL (ref 0.00–0.08)
Troponin i, poc: 0 ng/mL (ref 0.00–0.08)

## 2018-07-28 LAB — BASIC METABOLIC PANEL
Anion gap: 9 (ref 5–15)
BUN: 10 mg/dL (ref 8–23)
CO2: 25 mmol/L (ref 22–32)
Calcium: 9.3 mg/dL (ref 8.9–10.3)
Chloride: 105 mmol/L (ref 98–111)
Creatinine, Ser: 0.94 mg/dL (ref 0.61–1.24)
GFR calc Af Amer: >60 mL/min (ref >60)
GFR calc non Af Amer: >60 mL/min (ref >60)
Glucose, Bld: 99 mg/dL (ref 70–99)
Potassium: 4.8 mmol/L (ref 3.5–5.1)
Sodium: 139 mmol/L (ref 135–145)

## 2018-07-28 NOTE — ED Provider Notes (Addendum)
[facility] EMERGENCY DEPARTMENT Provider Note   CSN: [id number] Arrival date & time: 07/28/18  1229     History   Chief Complaint Chief Complaint  Patient presents with  . Chest Pain    HPI Kurt Diaz is a 77 y.o. male.  HPI  77 year old male with history of coronary artery disease status post CABG on 1011 comes in with chief complaint of chest pain. Patient reports that he woke up this morning with left-sided chest pain.  Chest pain is located on the lower part of his chest and he describes it as sharp pain that is different than his ischemic/anginal pain.  Patient's pain is worse with deep inspiration.  Pain is not worse with laying flat or with exertion. He denies any associated shortness of breath, nausea, diaphoresis, cough.  Patient's pain has been constant, therefore he decided to come to the ER.  Past Medical History:  Diagnosis Date  . Chest pain   . Coronary artery disease   . Coronary syndrome, acute ([place]) 2006  . Hyperlipidemia   . Hypertension   . Syncope and collapse     Patient Active Problem List   Diagnosis Date Noted  . S/P CABG x 3 07/03/2018  . Coronary artery disease 10/25/2016  . Coronary artery disease involving native coronary artery of native heart with angina pectoris ([place])   . Prostate cancer ([place]) 12/18/2012  . BPH (benign prostatic hyperplasia) 12/18/2012  . Benign hypertensive heart disease without heart failure 12/18/2012  . Ischemic heart disease 05/03/2011  . Hypercholesterolemia 05/03/2011    Past Surgical History:  Procedure Laterality Date  . CARDIAC CATHETERIZATION  10/13/2009   EF 65%  . CARDIAC CATHETERIZATION N/A 10/25/2016   Procedure: Left Heart Cath and Coronary Angiography;  Surgeon: Belva Crome, MD;  Location: [facility];  Service: Cardiovascular;  Laterality: N/A;  . CARDIAC CATHETERIZATION N/A 10/25/2016   Procedure: Coronary Stent Intervention;  Surgeon: Belva Crome, MD;  Location: [facility];  Service: Cardiovascular;  Laterality: N/A;  . CARDIAC CATHETERIZATION N/A 10/25/2016   Procedure: Intravascular Pressure Wire/FFR Study;  Surgeon: Belva Crome, MD;  Location: [facility];  Service: Cardiovascular;  Laterality: N/A;  . CARDIOVASCULAR STRESS TEST  09/29/2009   EF 70%  . CORONARY ANGIOPLASTY     STENTING OF HIS LAD, STENTING OF HIS RIGHT CORONARY.  Marland Kitchen CORONARY ARTERY BYPASS GRAFT N/A 07/03/2018   Procedure: Coronary artery bypass grafting times three using left internal mammary artery and right greater saphenous vein harvested endoscopically.;  Surgeon: Gaye Pollack, MD;  Location: [facility];  Service: Open Heart Surgery;  Laterality: N/A;  . INTRAVASCULAR PRESSURE WIRE/FFR STUDY N/A 06/08/2018   Procedure: INTRAVASCULAR PRESSURE WIRE/FFR STUDY;  Surgeon: Nelva Bush, MD;  Location: [facility];  Service: Cardiovascular;  Laterality: N/A;  . KNEE SURGERY Left   . LEFT HEART CATH AND CORONARY ANGIOGRAPHY N/A 06/08/2018   Procedure: LEFT HEART CATH AND CORONARY ANGIOGRAPHY;  Surgeon: Nelva Bush, MD;  Location: [facility];  Service: Cardiovascular;  Laterality: N/A;  . TEE WITHOUT CARDIOVERSION N/A 07/03/2018   Procedure: TRANSESOPHAGEAL ECHOCARDIOGRAM (TEE);  Surgeon: Gaye Pollack, MD;  Location: [facility];  Service: Open Heart Surgery;  Laterality: N/A;  . US ECHOCARDIOGRAPHY  12/15/2009   EF 55-60%        Home Medications    Prior to Admission medications   Medication Sig Start Date End Date Taking? Authorizing  Provider  acetaminophen (TYLENOL) 325 MG tablet Take 2 tablets (650 mg total) by mouth every 6 (six) hours as needed for mild pain. 07/07/18  Yes Lars Pinks M, PA-C  aspirin EC 325 MG EC tablet Take 1 tablet (325 mg total) by mouth daily. 07/07/18  Yes Lars Pinks M, PA-C  atorvastatin (LIPITOR) 80 MG tablet Take 1 tablet (80 mg total) by mouth daily at 6 PM. 01/13/18  Yes Josue Hector, MD  lisinopril  (PRINIVIL,ZESTRIL) 2.5 MG tablet Take 1 tablet (2.5 mg total) by mouth daily. 07/07/18  Yes Lars Pinks M, PA-C  metoprolol tartrate (LOPRESSOR) 25 MG tablet Take 0.5 tablets (12.5 mg total) by mouth 2 (two) times daily. 07/07/18  Yes Lars Pinks M, PA-C  Multiple Vitamin (MULTIVITAMIN WITH MINERALS) TABS tablet Take 1 tablet by mouth daily after supper.    Yes [provider]  nitroGLYCERIN (NITROSTAT) 0.4 MG SL tablet Place 0.4 mg under the tongue as needed for chest pain. 10/21/17  Yes [provider]  traMADol (ULTRAM) 50 MG tablet Take 50 mg by mouth every 4-6 hours PRN sever pain 07/07/18  Yes Lars Pinks M, PA-C  furosemide (LASIX) 40 MG tablet Take 1 tablet (40 mg total) by mouth daily. For 5 days then stop. Patient not taking: Reported on 07/28/2018 07/07/18   Nani Skillern, PA-C  potassium chloride (K-DUR,KLOR-CON) 10 MEQ tablet Take 1 tablet (10 mEq total) by mouth daily. For 5 days then stop. Patient not taking: Reported on 07/28/2018 07/07/18   Nani Skillern, PA-C    Family History Family History  Problem Relation Age of Onset  . Heart attack Mother   . Stroke Mother   . Lung cancer Father     Social History Social History   Tobacco Use  . Smoking status: Never Smoker  . Smokeless tobacco: Never Used  Substance Use Topics  . Alcohol use: No  . Drug use: No     Allergies   Patient has no known allergies.   Review of Systems Review of Systems  Constitutional: Positive for activity change.  Respiratory: Negative for cough and shortness of breath.   Cardiovascular: Positive for chest pain.  Gastrointestinal: Negative for nausea and vomiting.  Hematological: Does not bruise/bleed easily.  All other systems reviewed and are negative.    Physical Exam Updated Vital Signs BP 116/88   Pulse 86   Temp 98 F (36.7 C) (Oral)   Resp 13   SpO2 97%   Physical Exam  Constitutional: He is oriented to person,  place, and time. He appears well-developed.  HENT:  Head: Atraumatic.  Neck: Neck supple.  Cardiovascular: Normal rate.  Pulmonary/Chest: Effort normal. He has no decreased breath sounds. He has no wheezes. He has no rhonchi. He has no rales.  Musculoskeletal:       Right lower leg: He exhibits no tenderness and no edema.       Left lower leg: He exhibits no tenderness and no edema.  Neurological: He is alert and oriented to person, place, and time.  Skin: Skin is warm.  Nursing note and vitals reviewed.    ED Treatments / Results  Labs (all labs ordered are listed, but only abnormal results are displayed) Labs Reviewed  CBC - Abnormal; Notable for the following components:      Result Value   WBC 12.1 (*)    All other components within normal limits  BASIC METABOLIC PANEL  I-STAT TROPONIN, ED  I-STAT TROPONIN, ED  EKG EKG Interpretation  Date/Time:  Tuesday July 28 2018 12:33:45 EST Ventricular Rate:  87 PR Interval:  118 QRS Duration: 88 QT Interval:  358 QTC Calculation: 430 R Axis:   57 Text Interpretation:  Normal sinus rhythm Possible Inferior infarct , age undetermined Abnormal ECG Inferior Q waves noted No significant change since last tracing Confirmed by Varney Biles [phone]) on 07/28/2018 2:14:31 PM   Radiology Dg Chest 2 View  Result Date: 07/28/2018 CLINICAL DATA:  Status post CABG 25 days ago with onset of pain today described is left-sided pressure sensation without radiation. EXAM: CHEST - 2 VIEW COMPARISON:  Chest x-ray of July 06, 2018 FINDINGS: The right lung is adequately inflated and clear. On the left there is a persistent small pleural effusion. There is no pneumothorax or pneumomediastinum. There is no alveolar infiltrate. The heart and pulmonary vascularity are normal. The sternal wires are intact. Coronary artery stents are visible as well as graft markers. The bony thorax exhibits no acute abnormality. IMPRESSION: Small residual left  pleural effusion. Otherwise clear lungs with no evidence of pneumothorax. Normal appearance of the heart and pulmonary vascularity. Normal retrosternal region. Electronically Signed   By: David  Martinique M.D.   On: 07/28/2018 13:21    Procedures Procedures (including critical care time)   EMERGENCY DEPARTMENT [place] CARDIAC EXAM "Study: Limited Ultrasound of the heart and pericardium"  INDICATIONS:Chest pain Multiple views of the heart and pericardium were obtained in real-time with a multi-frequency probe.  PERFORMED XB:JYNWGN  IMAGES ARCHIVED?: Yes  FINDINGS: Small effusion, Normal contractility and Tamponade physiology absent  LIMITATIONS:  Emergent procedure  VIEWS USED: Parasternal long axis and Parasternal short axis  INTERPRETATION: Cardiac activity present, Pericardial effusion present, Cardiac tamponade absent and Normal contractility  CPT Code: [phone] (limited transthoracic cardiac)   Medications Ordered in ED Medications - No data to display   Initial Impression / Assessment and Plan / ED Course  I have reviewed the triage vital signs and the nursing notes.  Pertinent labs & imaging results that were available during my care of the patient were reviewed by me and considered in my medical decision making (see chart for details).  Clinical Course as of Jul 28 2056  Tue Jul 28, 2018  2054 Results from the ER workup discussed with the patient face to face and all questions answered to the best of my ability.  We discussed with the patient we are not sure what is causing his pleuritic chest pain.  We informed him that PE is a small possibility given the recent surgery.  After discussion of work-up required for PE and the overall low clinical gestalt for it from my perspective, patient took time to discuss the matter with his wife and decided against getting a CT PE during this visit.  He made his decision after ambulatory pulse ox revealed that his O2 sats has been 99%.  He  also states that he has not had any shortness of breath, exercise intolerance -therefore he is comfortable with wait and watch approach.  Strict ER return precautions have been discussed, and patient is agreeing with the plan and is comfortable with the workup done and the recommendations from the ER.    [AN]    Clinical Course User Index [AN] Varney Biles, MD    77 year old male comes in with chief complaint of chest pain. Patient is status post CABG from 1011.  He reports that he woke up this morning with pleuritic chest pain on the left  side.  Pain is focal and nonradiating.  Does not appear to be ACS or angina equivalent.  EKG does not show any evidence of pericarditis.  Although the pain is pleuritic, there is no associated cough, shortness of breath and the pain is not worse with laying flat.  Patient is O2 sats is noted to be somewhere between 93 to 97 percent.  PE considered in the differential, however there is no signs of DVT and patient has been fairly active since the surgery.  Ultrasound at the bedside did not reveal any large pericardial effusion.  Plan is for patient to get amatory pulse ox, if his O2 sats dropped then we will definitely discuss CT PE. Case also discussed with CT surgery.  Dr. Mohammed Kindle, [facility] surgery does not have any other recommendations besides ultrasound which was completed.  Final Clinical Impressions(s) / ED Diagnoses   Final diagnoses:  Pleuritic chest pain    ED Discharge Orders    None       Varney Biles, MD 07/28/18 1932    Varney Biles, MD 07/28/18 2057

## 2018-07-28 NOTE — Telephone Encounter (Signed)
Kurt Diaz contacted the office and stated he was going to the emergency room.  He has had chest pain today that is not going away.  He wanted to make sure Dr. Cyndia Bent was aware.  He is s/p CABG x3 07/03/18.

## 2018-07-28 NOTE — Discharge Instructions (Addendum)
We saw in the ER for chest pain. Results in the ER have not identified a clear cause for your pain.  There is no concerns for fluid around the heart or inflammation around the heart.  Also there is no concerns for pneumonia or any heart attack.  As discussed, there is a mild possibility that this might be a blood clot. If you start having worsening of your chest pain, increasing shortness of breath, dizziness or near fainting spells, or cough with bloody sputum please return to the ER immediately.  Otherwise consider seeing a primary care doctor in 1 week.  You may take over-the-counter Tylenol or ibuprofen for pain control.

## 2018-07-28 NOTE — ED Notes (Signed)
ED Provider at bedside. 

## 2018-07-28 NOTE — ED Triage Notes (Signed)
77 year old Caucasian male 25 days post op open heart surgery. Pain started at 0700 he had a sudden onset of left side chest pressure -has been constant, non radiating. Pt took 81 mg asa and one tramadol tablet-no relief.

## 2018-07-28 NOTE — ED Notes (Signed)
Pt ambulates to restroom on pulse ox. Maintains 99%. No complaints.

## 2018-07-31 ENCOUNTER — Encounter: Payer: Self-pay | Admitting: Cardiology

## 2018-07-31 ENCOUNTER — Ambulatory Visit (INDEPENDENT_AMBULATORY_CARE_PROVIDER_SITE_OTHER): Payer: BLUE CROSS/BLUE SHIELD | Admitting: Cardiology

## 2018-07-31 VITALS — BP 126/70 | HR 74 | Ht 68.0 in | Wt 182.4 lb

## 2018-07-31 DIAGNOSIS — R079 Chest pain, unspecified: Secondary | ICD-10-CM

## 2018-07-31 DIAGNOSIS — I2510 Atherosclerotic heart disease of native coronary artery without angina pectoris: Secondary | ICD-10-CM

## 2018-07-31 DIAGNOSIS — Z951 Presence of aortocoronary bypass graft: Secondary | ICD-10-CM

## 2018-07-31 DIAGNOSIS — E7800 Pure hypercholesterolemia, unspecified: Secondary | ICD-10-CM

## 2018-07-31 DIAGNOSIS — I119 Hypertensive heart disease without heart failure: Secondary | ICD-10-CM

## 2018-07-31 NOTE — Progress Notes (Signed)
Cardiology Office Note   Date:  08/02/2018   ID:  Kurt Diaz, DOB 10/08/1940, MRN [id number]  PCP:  Antony Contras, MD  Cardiologist:  Dr. [name]    Chief Complaint  Patient presents with  . Hospitalization Follow-up    CABG      History of Present Illness: Kurt Diaz is a 77 y.o. male who presents for post hospitalization.   CAD History of MI in 06-Jan-2005 sudden death,  when he had syncope in [place] and was found to be pulseless and underwent CPR and then underwent emergency cardiac catheterization and had intervention with 2 vessel disease being found. He had angioplasty and stenting to his LAD and angioplasty and stenting of his right coronary artery. Nuclear stress test January 2011 showed a small area of reversible ischemia and as a result he underwent cardiac catheterization by Dr. Acie Fredrickson on 10/13/2009 which showed that the stents were widely patent. There was a proximal 70% stenosis in the diagonal which was too small for PCI. The patient was seen in the office on 11/25/12 as a work in complaining of exertional dyspnea and some intermittent chest discomfort and left jaw discomfort. For this reason we arranged for repeat cardiac catheterization which was done by Dr. [name] on 11/30/12 and it showed that his previous stents were widely patent and no new lesions were seen. Continued medical management was advised  Cardiac cath was done 10/25/16 with ostial 70% LAD with FFR 0.87, patent stent to prox LAD. Moderate ostial to proximal RCA and severe distal RCA disease. Widely patent mid RCA stent. The right coronary was treated with distal (2.25 mm x 18) and proximal (2.75 x 15) Onyx Resolute drug-eluting stents with reduction in stenosis from 85% and 75% to 0% and 0% respectively with TIMI grade 3 flow. Patent LCX. EF 45-50%.   Plan was for ASA and Brilinta for 30 days then ASA and Plavix. It was noted his LAD disease needed to be monitored.   On last visit  was having atypical resting right sided chest pain Saw [facility] and given Nexium with some benefit. Pain is not exertional but does radiate to jaw--nuc study 04/07/18 with EF 63%, no ischemia normal study.  Pain now continues on Lt side and cannot be recreated with movement Cannot recreate with palpation.  No associated symptoms of nausea or SOB. Usually is a one but intensifies to a 3. He has not yet started Nexium or Carafate. Does not occur after meals and does not feel like his usual GERD. reviewed last cath and has 70% RCA and 70% LAD stenosis concern for increased stenosis. I added imdur and arranged cardiac cath.  Pt had cardiac cath and recommended CABG.  2 vessel disease, with 70% ostial LAD with FFR 0.77 stenosis, 80% mRCA stenosis in stent, and 70% distal RCA stenosis.  Normal LV function  Has seen Dr. Cyndia Bent.   Pt wants a second opinion of minimally invasive CABG.  He has contacted Dr. Evelina Dun at [facility].  He will get a disc of the cath to take with him.    He continues with intermittent Lt lat chest pain.  imdur has not seemed to make a difference in amount of pain.  Again no associated symptoms.  He would like to know how soon he should have surgery.     Pt had CABG 07/03/18  X 3 with LIMA to LAD, VG to diag. And VG to RCA.   He did well post  op and was discharged 07/07/18  I see no mention of a fib.    Pt did well until 07/28/18 and developed chest pain.  Was seen in ER on the 5th lt sided chest pressure, increased with deep breath. . Echo was done  Not large, + pericardial effusion,  No tamponade. tropoin neg , WBC 12.1, Hgb 14.3 up from 10.7 plts 314.   Na 134, K+ 4.5, Cr. 0.99 CXR small residual lt pl effusion otherwise clear.    EKG from hospital. Stable.     today no complaints, only mild chest pressure with deep breath. He is walking 15 min twice a day.  his appetite is good.  BP is controlled. Labs from ER with anemia resolved.   Past Medical History:  Diagnosis  Date  . Chest pain   . Coronary artery disease   . Coronary syndrome, acute ([place]) 2006  . Hyperlipidemia   . Hypertension   . Syncope and collapse     Past Surgical History:  Procedure Laterality Date  . CARDIAC CATHETERIZATION  10/13/2009   EF 65%  . CARDIAC CATHETERIZATION N/A 10/25/2016   Procedure: Left Heart Cath and Coronary Angiography;  Surgeon: Belva Crome, MD;  Location: [facility];  Service: Cardiovascular;  Laterality: N/A;  . CARDIAC CATHETERIZATION N/A 10/25/2016   Procedure: Coronary Stent Intervention;  Surgeon: Belva Crome, MD;  Location: [facility];  Service: Cardiovascular;  Laterality: N/A;  . CARDIAC CATHETERIZATION N/A 10/25/2016   Procedure: Intravascular Pressure Wire/FFR Study;  Surgeon: Belva Crome, MD;  Location: [facility];  Service: Cardiovascular;  Laterality: N/A;  . CARDIOVASCULAR STRESS TEST  09/29/2009   EF 70%  . CORONARY ANGIOPLASTY     STENTING OF HIS LAD, STENTING OF HIS RIGHT CORONARY.  Marland Kitchen CORONARY ARTERY BYPASS GRAFT N/A 07/03/2018   Procedure: Coronary artery bypass grafting times three using left internal mammary artery and right greater saphenous vein harvested endoscopically.;  Surgeon: Gaye Pollack, MD;  Location: [facility];  Service: Open Heart Surgery;  Laterality: N/A;  . INTRAVASCULAR PRESSURE WIRE/FFR STUDY N/A 06/08/2018   Procedure: INTRAVASCULAR PRESSURE WIRE/FFR STUDY;  Surgeon: Nelva Bush, MD;  Location: [facility];  Service: Cardiovascular;  Laterality: N/A;  . KNEE SURGERY Left   . LEFT HEART CATH AND CORONARY ANGIOGRAPHY N/A 06/08/2018   Procedure: LEFT HEART CATH AND CORONARY ANGIOGRAPHY;  Surgeon: Nelva Bush, MD;  Location: [facility];  Service: Cardiovascular;  Laterality: N/A;  . TEE WITHOUT CARDIOVERSION N/A 07/03/2018   Procedure: TRANSESOPHAGEAL ECHOCARDIOGRAM (TEE);  Surgeon: Gaye Pollack, MD;  Location: [facility];  Service: Open Heart Surgery;  Laterality: N/A;  . US  ECHOCARDIOGRAPHY  12/15/2009   EF 55-60%     Current Outpatient Medications  Medication Sig Dispense Refill  . acetaminophen (TYLENOL) 325 MG tablet Take 2 tablets (650 mg total) by mouth every 6 (six) hours as needed for mild pain.    Marland Kitchen aspirin EC 325 MG EC tablet Take 1 tablet (325 mg total) by mouth daily. 30 tablet 0  . atorvastatin (LIPITOR) 80 MG tablet Take 1 tablet (80 mg total) by mouth daily at 6 PM. 30 tablet 9  . lisinopril (PRINIVIL,ZESTRIL) 2.5 MG tablet Take 1 tablet (2.5 mg total) by mouth daily. 30 tablet 1  . metoprolol tartrate (LOPRESSOR) 25 MG tablet Take 0.5 tablets (12.5 mg total) by mouth 2 (two) times daily. 30 tablet 1  . Multiple Vitamin (MULTIVITAMIN WITH  MINERALS) TABS tablet Take 1 tablet by mouth daily after supper.     . nitroGLYCERIN (NITROSTAT) 0.4 MG SL tablet Place 0.4 mg under the tongue as needed for chest pain.    . traMADol (ULTRAM) 50 MG tablet Take 50 mg by mouth every 4-6 hours PRN sever pain 30 tablet 0   No current facility-administered medications for this visit.     Allergies:   Patient has no known allergies.    Social History:  The patient  reports that he has never smoked. He has never used smokeless tobacco. He reports that he does not drink alcohol or use drugs.   Family History:  The patient's family history includes Heart attack in his mother; Lung cancer in his father; Stroke in his mother.    ROS:  General:no colds or fevers, no weight changes Skin:no rashes or ulcers HEENT:no blurred vision, no congestion CV:see HPI PUL:see HPI GI:no diarrhea constipation or melena, no indigestion GU:no hematuria, no dysuria MS:no joint pain, no claudication Neuro:no syncope, no lightheadedness Endo:no diabetes, no thyroid disease  Wt Readings from Last 3 Encounters:  07/31/18 182 lb 6.4 oz (82.7 kg)  07/07/18 194 lb 10.7 oz (88.3 kg)  07/01/18 195 lb 12.8 oz (88.8 kg)     PHYSICAL EXAM: VS:  BP 126/70   Pulse 74   Ht 5\' 8"  (1.727  m)   Wt 182 lb 6.4 oz (82.7 kg)   SpO2 97%   BMI 27.73 kg/m  , BMI Body mass index is 27.73 kg/m. General:Pleasant affect, NAD Skin:Warm and dry, brisk capillary refill HEENT:normocephalic, sclera clear, mucus membranes moist Neck:supple, no JVD, no bruits  Heart:S1S2 RRR without murmur, gallup, rub or click Lungs:clear without rales, rhonchi, or wheezes FVC:BSWH, non tender, + BS, do not palpate liver spleen or masses Ext:no lower ext edema, 2+ pedal pulses, 2+ radial pulses Neuro:alert and oriented X 3, MAE, follows commands, + facial symmetry    EKG:  EKG is NOT ordered today.    Recent Labs: 07/01/2018: ALT 22 07/04/2018: Magnesium 2.2 07/28/2018: BUN 10; Creatinine, Ser 0.94; Hemoglobin 14.3; Platelets 314; Potassium 4.8; Sodium 139    Lipid Panel    Component Value Date/Time   CHOL 99 (L) 12/16/2016 0730   TRIG 75 12/16/2016 0730   HDL 34 (L) 12/16/2016 0730   CHOLHDL 2.9 12/16/2016 0730   CHOLHDL 4 12/30/2014 0741   VLDL 20.6 12/30/2014 0741   LDLCALC 50 12/16/2016 0730       Other studies Reviewed: Additional studies/ records that were reviewed today include:   Cardiac cath 06/08/18. Conclusions: 1. Significant two-vessel coronary artery disease with 70% ostial LAD (FFR 0.77) stenosis, 80% mid RCA in-stent restenosis, and 70% distal RCA stenosis. 2. Normal left ventricular systolic function and filling pressure.  Recommendations: 1. Cardiac surgery consultation for CABG. 2. Increase isosorbide mononitrate to 30 mg daily. 3. Aggressive secondary prevention.  Recommend Aspirin 81mg  daily for multivessel CAD pending CABG evaluation.  Pre-op dopplers Right Carotid: Velocities in the right ICA are consistent with a 1-39% stenosis.  Left Carotid: Velocities in the left ICA are consistent with a 1-39% stenosis. Vertebrals: Bilateral vertebral arteries demonstrate antegrade flow.  Right ABI: Resting right ankle-brachial index is within normal range. No  evidence of significant right lower extremity arterial disease. Left ABI: Resting left ankle-brachial index is within normal range. No evidence of significant left lower extremity arterial disease. Right Upper Extremity: Doppler waveforms remain within normal limits with right radial compression. Doppler waveforms decrease >  50% with right ulnar compression. Left Upper Extremity: Doppler waveforms remain within normal limits with left radial compression. Doppler waveforms decrease >50% with left ulnar compression.   Surgery 07/03/18 PROCEDURE:  TRANSESOPHAGEAL ECHOCARDIOGRAM (TEE), MEDIAN STERNOTOMY for Coronary artery bypass grafting times three (LIMA to LAD, SVG to DIAGONAL, SVG to RCA) using left internal mammary artery and right greater saphenous vein harvested endoscopically.    ASSESSMENT AND PLAN:  1. CAD with CABG X 3 07/03/18 is progressing well.  Except one episode of chest pain. He is exercising BP controlled on BB, statin, and ACE.  2.  CHest pain seen in ER with neg MI, now with mild chest discomfort with deep breath.  Will check with Dr. [name] if echo needed.  Mild pericardial effusion in ER on quick look  3.  HLD stable continue lipitor at 80  4.  Htn controlled.    Follow up with Dr. [name] in 6-8 weeks..   Current medicines are reviewed with the patient today.  The patient Has no concerns regarding medicines.  The following changes have been made:  See above Labs/ tests ordered today include:see above  Disposition:   FU:  see above  Signed, [name], NP  08/02/2018 3:29 PM    [facility] [place], [place], [place], [place] Phone: [phone]; Fax: [phone]

## 2018-07-31 NOTE — Patient Instructions (Addendum)
Medication Instructions:  Your physician recommends that you continue on your current medications as directed. Please refer to the Current Medication list given to you today.  If you need a refill on your cardiac medications before your next appointment, please call your pharmacy.   Lab work: none If you have labs (blood work) drawn today and your tests are completely normal, you will receive your results only by: Marland Kitchen MyChart Message (if you have MyChart) OR . A paper copy in the mail If you have any lab test that is abnormal or we need to change your treatment, we will call you to review the results.  Testing/Procedures: none  Follow-Up: At [facility], you and your health needs are our priority.  As part of our continuing mission to provide you with exceptional heart care, we have created designated Provider Care Teams.  These Care Teams include your primary Cardiologist (physician) and Advanced Practice Providers (APPs -  Physician Assistants and Nurse Practitioners) who all work together to provide you with the care you need, when you need it. You will need a follow up appointment in 6-8 weeks.  Please call our office 2 months in advance to schedule this appointment.  You may see Jenkins Rouge, MD or one of the following Advanced Practice Providers on your designated Care Team:   Truitt Merle, NP [name], NP . Kathyrn Drown, NP  Any Other Special Instructions Will Be Listed Below (If Applicable).

## 2018-08-04 ENCOUNTER — Telehealth (HOSPITAL_COMMUNITY): Payer: Self-pay

## 2018-08-04 NOTE — Telephone Encounter (Signed)
Attempted to call patient in regards to Cardiac Rehab - LM on VM 

## 2018-08-04 NOTE — Telephone Encounter (Signed)
Pt returned phone call in regards to Cardiac Rehab - Scheduled orientation on 10/11/18 8:15am. Patient will attend the 6:45am exc class. Patient currently works and will inform us on two days he will be able to attend. Mailed packet.

## 2018-08-05 ENCOUNTER — Encounter: Payer: Self-pay | Admitting: Surgery

## 2018-08-05 ENCOUNTER — Ambulatory Visit (INDEPENDENT_AMBULATORY_CARE_PROVIDER_SITE_OTHER): Payer: Self-pay | Admitting: Surgery

## 2018-08-05 ENCOUNTER — Other Ambulatory Visit: Payer: Self-pay | Admitting: Surgery

## 2018-08-05 ENCOUNTER — Ambulatory Visit
Admission: RE | Admit: 2018-08-05 | Discharge: 2018-08-05 | Disposition: A | Discharge status: Home / Self Care | Payer: BLUE CROSS/BLUE SHIELD | Source: Ambulatory Visit | Attending: Surgery | Admitting: Surgery

## 2018-08-05 VITALS — BP 133/74 | HR 76 | Resp 20 | Ht 68.0 in | Wt 183.0 lb

## 2018-08-05 DIAGNOSIS — Z951 Presence of aortocoronary bypass graft: Secondary | ICD-10-CM

## 2018-08-05 DIAGNOSIS — I2510 Atherosclerotic heart disease of native coronary artery without angina pectoris: Secondary | ICD-10-CM

## 2018-08-05 NOTE — Progress Notes (Signed)
HPI: Patient returns for routine postoperative follow-up having undergone coronary artery bypass graft surgery x3 on 07/03/2017. The patient's early postoperative recovery while in the hospital was notable for an uncomplicated postoperative course. Since hospital discharge the patient reports that he came back to the ER on 07/28/2018 due to some left-sided chest pain that sounded pleuritic in nature.  He had a limited ultrasound of the heart performed by the emergency room physician which showed a small pericardial effusion, normal contractility, and no tamponade.  His oxygen saturations on room air were 99% and it was not felt that any further work-up was necessary.  It was felt that this may be some pericarditis although electrocardiogram did not show any changes suggesting pericarditis.  His troponin was negative.  He was seen back in the office by cardiology on 07/31/2018 and was having only mild left chest discomfort with deep breaths.  He says that since then the pains have resolved.  He is feeling well.  He is walking daily without chest pain or shortness of breath.  He has already returned to work and walks to his office from his house.  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  Current Outpatient Medications  Medication Sig Dispense Refill  . acetaminophen (TYLENOL) 325 MG tablet Take 2 tablets (650 mg total) by mouth every 6 (six) hours as needed for mild pain.    Marland Kitchen aspirin EC 325 MG EC tablet Take 1 tablet (325 mg total) by mouth daily. 30 tablet 0  . atorvastatin (LIPITOR) 80 MG tablet Take 1 tablet (80 mg total) by mouth daily at 6 PM. 30 tablet 9  . lisinopril (PRINIVIL,ZESTRIL) 2.5 MG tablet Take 1 tablet (2.5 mg total) by mouth daily. 30 tablet 1  . metoprolol tartrate (LOPRESSOR) 25 MG tablet Take 0.5 tablets (12.5 mg total) by mouth 2 (two) times daily. 30 tablet 1  . Multiple Vitamin (MULTIVITAMIN WITH MINERALS) TABS tablet Take 1 tablet by mouth daily after  supper.     . nitroGLYCERIN (NITROSTAT) 0.4 MG SL tablet Place 0.4 mg under the tongue as needed for chest pain.    . traMADol (ULTRAM) 50 MG tablet Take 50 mg by mouth every 4-6 hours PRN sever pain 30 tablet 0   No current facility-administered medications for this visit.     Physical Exam: BP 133/74   Pulse 76   Resp 20   Ht 5\' 8"  (1.727 m)   Wt 183 lb (83 kg)   SpO2 97% Comment: RA  BMI 27.83 kg/m  He looks well. Cardiac exam shows regular rate and rhythm with normal heart sounds. Lungs are clear. Chest incision is healing well and sternum stable. His leg incision is healing well and there is no lower extremity edema.  Diagnostic Tests:  CLINICAL DATA:  Status post coronary bypass grafting several weeks ago  EXAM: CHEST - 2 VIEW  COMPARISON:  07/28/2018  FINDINGS: Cardiac shadow is within normal limits. Postsurgical changes are again seen. The lungs are clear bilaterally. No acute bony abnormality is noted.  IMPRESSION: No acute abnormality seen.   Electronically Signed   By: Inez Catalina M.D.   On: 08/05/2018 14:31   Impression:  Overall he is making a good recovery following coronary bypass graft surgery.  He is walking daily and back to work full-time.  He is planning on participating in cardiac rehab starting in January.  I told him that he could return to driving his car but  should refrain from lifting anything heavier than 10 pounds for 3 months postoperatively.  Plan:  He is going to follow-up with cardiology in December and will return to see me if he has any problems with his incisions.   Gaye Pollack, MD [facility] [phone]

## 2018-08-07 ENCOUNTER — Other Ambulatory Visit: Payer: Self-pay | Admitting: Physician Assistant

## 2018-08-11 ENCOUNTER — Telehealth (HOSPITAL_COMMUNITY): Payer: Self-pay

## 2018-09-01 ENCOUNTER — Other Ambulatory Visit: Payer: Self-pay | Admitting: Surgery

## 2018-09-01 NOTE — Progress Notes (Signed)
Cardiology Office Note   Date:  09/03/2018   ID:  KIMMIE DOREN, DOB 09-04-1941, MRN [id number]  PCP:  Antony Contras, MD  Cardiologist:  Dr. [name]    No chief complaint on file.     History of Present Illness: Kurt Diaz is a 77 y.o. male who presents for f/u CAD/CABG  MI 01/12/05 sudden death airport [place]. Stenting LAD and RCA. 10/25/16 new stents to proximal and distal RCA. EF 45-50% More angina October 2019 had CABG 07/03/18 with LIMA to LAD, SVG to D1, and SVG to RCA.  Seen back in ER 07/28/18 For chest pain. Trivial effusion , R/O CXR ok. D/c home   BP good at home but high in office today did not take ACE   [place]s [place] lot [place] originally from [place]. Does sales for large systems   Past Medical History:  Diagnosis Date  . Chest pain   . Coronary artery disease   . Coronary syndrome, acute ([place]) 01-12-05  . Hyperlipidemia   . Hypertension   . Syncope and collapse     Past Surgical History:  Procedure Laterality Date  . CARDIAC CATHETERIZATION  10/13/2009   EF 65%  . CARDIAC CATHETERIZATION N/A 10/25/2016   Procedure: Left Heart Cath and Coronary Angiography;  Surgeon: Belva Crome, MD;  Location: [place] [facility];  Service: Cardiovascular;  Laterality: N/A;  . CARDIAC CATHETERIZATION N/A 10/25/2016   Procedure: Coronary Stent Intervention;  Surgeon: Belva Crome, MD;  Location: [place] [facility];  Service: Cardiovascular;  Laterality: N/A;  . CARDIAC CATHETERIZATION N/A 10/25/2016   Procedure: Intravascular Pressure Wire/FFR Study;  Surgeon: Belva Crome, MD;  Location: [place] [facility];  Service: Cardiovascular;  Laterality: N/A;  . CARDIOVASCULAR STRESS TEST  09/29/2009   EF 70%  . CORONARY ANGIOPLASTY     STENTING OF HIS LAD, STENTING OF HIS RIGHT CORONARY.  Marland Kitchen CORONARY ARTERY BYPASS GRAFT N/A 07/03/2018   Procedure: Coronary artery bypass grafting times three using left internal mammary artery and right greater saphenous vein harvested  endoscopically.;  Surgeon: Gaye Pollack, MD;  [facility];  Service: Open Heart Surgery;  Laterality: N/A;  . INTRAVASCULAR PRESSURE WIRE/FFR STUDY N/A 06/08/2018   Procedure: INTRAVASCULAR PRESSURE WIRE/FFR STUDY;  Surgeon: Nelva Bush, MD;  Location: [place] [facility];  Service: Cardiovascular;  Laterality: N/A;  . KNEE SURGERY Left   . LEFT HEART CATH AND CORONARY ANGIOGRAPHY N/A 06/08/2018   Procedure: LEFT HEART CATH AND CORONARY ANGIOGRAPHY;  Surgeon: Nelva Bush, MD;  Location: [place] [facility];  Service: Cardiovascular;  Laterality: N/A;  . TEE WITHOUT CARDIOVERSION N/A 07/03/2018   Procedure: TRANSESOPHAGEAL ECHOCARDIOGRAM (TEE);  Surgeon: Gaye Pollack, MD;  Location: [place];  Service: Open Heart Surgery;  Laterality: N/A;  . US ECHOCARDIOGRAPHY  12/15/2009   EF 55-60%     Current Outpatient Medications  Medication Sig Dispense Refill  . acetaminophen (TYLENOL) 325 MG tablet Take 2 tablets (650 mg total) by mouth every 6 (six) hours as needed for mild pain.    Marland Kitchen aspirin EC 325 MG EC tablet Take 1 tablet (325 mg total) by mouth daily. 30 tablet 0  . atorvastatin (LIPITOR) 80 MG tablet Take 1 tablet (80 mg total) by mouth daily at 6 PM. 30 tablet 9  . lisinopril (PRINIVIL,ZESTRIL) 2.5 MG tablet Take 1 tablet (2.5 mg total) by mouth daily. 30 tablet 1  . metoprolol tartrate (LOPRESSOR) 25 MG tablet Take 0.5  tablets (12.5 mg total) by mouth 2 (two) times daily. 30 tablet 1  . Multiple Vitamin (MULTIVITAMIN WITH MINERALS) TABS tablet Take 1 tablet by mouth daily after supper.     . nitroGLYCERIN (NITROSTAT) 0.4 MG SL tablet Place 0.4 mg under the tongue as needed for chest pain.    . traMADol (ULTRAM) 50 MG tablet Take 50 mg by mouth every 4-6 hours PRN sever pain 30 tablet 0   No current facility-administered medications for this visit.     Allergies:   Patient has no known allergies.    Social History:  The patient  reports that he has never smoked. He has never  used smokeless tobacco. He reports that he does not drink alcohol or use drugs.   Family History:  The patient's family history includes Heart attack in his mother; Lung cancer in his father; Stroke in his mother.    ROS:  General:no colds or fevers, no weight changes Skin:no rashes or ulcers HEENT:no blurred vision, no congestion CV:see HPI PUL:see HPI GI:no diarrhea constipation or melena, no indigestion GU:no hematuria, no dysuria MS:no joint pain, no claudication Neuro:no syncope, no lightheadedness Endo:no diabetes, no thyroid disease  Wt Readings from Last 3 Encounters:  09/03/18 183 lb (83 kg)  08/05/18 183 lb (83 kg)  07/31/18 182 lb 6.4 oz (82.7 kg)     PHYSICAL EXAM: VS:  BP (!) 154/78   Pulse 68   Ht 5\' 8"  (1.727 m)   Wt 183 lb (83 kg)   BMI 27.83 kg/m  , BMI Body mass index is 27.83 kg/m. Affect appropriate Healthy:  appears stated age 89: normal Neck supple with no adenopathy JVP normal no bruits no thyromegaly Lungs clear with no wheezing and good diaphragmatic motion Heart:  S1/S2 no murmur, no rub, gallop or click PMI normal post sternotomy  Abdomen: benighn, BS positve, no tenderness, no AAA no bruit.  No HSM or HJR Distal pulses intact with no bruits No edema Neuro non-focal Skin warm and dry No muscular weakness    EKG:   SR old IMI no acute changes 07/29/18     Recent Labs: 07/01/2018: ALT 22 07/04/2018: Magnesium 2.2 07/28/2018: BUN 10; Creatinine, Ser 0.94; Hemoglobin 14.3; Platelets 314; Potassium 4.8; Sodium 139    Lipid Panel    Component Value Date/Time   CHOL 99 (L) 12/16/2016 0730   TRIG 75 12/16/2016 0730   HDL 34 (L) 12/16/2016 0730   CHOLHDL 2.9 12/16/2016 0730   CHOLHDL 4 12/30/2014 0741   VLDL 20.6 12/30/2014 0741   LDLCALC 50 12/16/2016 0730       Other studies Reviewed: Additional studies/ records that were reviewed today include:   Cardiac cath 06/08/18. Conclusions: 1. Significant two-vessel coronary  artery disease with 70% ostial LAD (FFR 0.77) stenosis, 80% mid RCA in-stent restenosis, and 70% distal RCA stenosis. 2. Normal left ventricular systolic function and filling pressure.  Recommendations: 1. Cardiac surgery consultation for CABG. 2. Increase isosorbide mononitrate to 30 mg daily. 3. Aggressive secondary prevention.  Recommend Aspirin 81mg  daily for multivessel CAD pending CABG evaluation.  Pre-op dopplers Right Carotid: Velocities in the right ICA are consistent with a 1-39% stenosis.  Left Carotid: Velocities in the left ICA are consistent with a 1-39% stenosis. Vertebrals: Bilateral vertebral arteries demonstrate antegrade flow.  Right ABI: Resting right ankle-brachial index is within normal range. No evidence of significant right lower extremity arterial disease. Left ABI: Resting left ankle-brachial index is within normal range. No evidence of  significant left lower extremity arterial disease. Right Upper Extremity: Doppler waveforms remain within normal limits with right radial compression. Doppler waveforms decrease >50% with right ulnar compression. Left Upper Extremity: Doppler waveforms remain within normal limits with left radial compression. Doppler waveforms decrease >50% with left ulnar compression.   Surgery 07/03/18 PROCEDURE:  TRANSESOPHAGEAL ECHOCARDIOGRAM (TEE), MEDIAN STERNOTOMY for Coronary artery bypass grafting times three (LIMA to LAD, SVG to DIAGONAL, SVG to RCA) using left internal mammary artery and right greater saphenous vein harvested endoscopically.    ASSESSMENT AND PLAN:  1. CAD with CABG X 3 07/03/18 sternum well healed continue cardiac rehab. ASA and statin   2.  CHest pain seen in ER with neg MI, trivial effusion bedside echo resolved observe   3.  HLD : continue high dose statin labs with primary   4.  HTN:  He will monitor at home if high we will increase lisinopril to 5 mg daily   F/U with me in 6 months    Jenkins Rouge

## 2018-09-03 ENCOUNTER — Ambulatory Visit (INDEPENDENT_AMBULATORY_CARE_PROVIDER_SITE_OTHER): Payer: BLUE CROSS/BLUE SHIELD | Admitting: Cardiovascular Disease

## 2018-09-03 VITALS — BP 154/78 | HR 68 | Ht 68.0 in | Wt 183.0 lb

## 2018-09-03 DIAGNOSIS — E7800 Pure hypercholesterolemia, unspecified: Secondary | ICD-10-CM | POA: Diagnosis not present

## 2018-09-03 DIAGNOSIS — I2510 Atherosclerotic heart disease of native coronary artery without angina pectoris: Secondary | ICD-10-CM | POA: Diagnosis not present

## 2018-09-03 DIAGNOSIS — I119 Hypertensive heart disease without heart failure: Secondary | ICD-10-CM

## 2018-09-03 DIAGNOSIS — Z951 Presence of aortocoronary bypass graft: Secondary | ICD-10-CM

## 2018-09-03 NOTE — Patient Instructions (Signed)

## 2018-09-07 ENCOUNTER — Other Ambulatory Visit: Payer: Self-pay | Admitting: Surgery

## 2018-09-07 ENCOUNTER — Other Ambulatory Visit: Payer: Self-pay | Admitting: Cardiovascular Disease

## 2018-09-10 ENCOUNTER — Other Ambulatory Visit: Payer: Self-pay | Admitting: Surgery

## 2018-09-10 ENCOUNTER — Other Ambulatory Visit: Payer: Self-pay | Admitting: Cardiovascular Disease

## 2018-09-21 ENCOUNTER — Telehealth (HOSPITAL_COMMUNITY): Payer: Self-pay

## 2018-09-21 DIAGNOSIS — M25562 Pain in left knee: Secondary | ICD-10-CM | POA: Diagnosis not present

## 2018-09-21 DIAGNOSIS — S82025A Nondisplaced longitudinal fracture of left patella, initial encounter for closed fracture: Secondary | ICD-10-CM | POA: Diagnosis not present

## 2018-09-28 ENCOUNTER — Telehealth (HOSPITAL_COMMUNITY): Payer: Self-pay | Admitting: *Deleted

## 2018-09-28 NOTE — Telephone Encounter (Signed)
Returned call to pt.  Message left on the answering machine.  Unable to reach pt.  Left message for pt to call back.  Contact information provided. Cherre Huger, BSN Cardiac and Training and development officer

## 2018-09-29 ENCOUNTER — Telehealth (HOSPITAL_COMMUNITY): Payer: Self-pay

## 2018-09-29 NOTE — Telephone Encounter (Signed)
Returned pt phone call, pt stated he injured his knee and will be traveling for 2-3 weeks at a time and will not be able to attend.  Close referral

## 2018-09-30 NOTE — Telephone Encounter (Signed)
Cardiac Rehab - Pharmacy Resident Documentation   Patient not able to attend Cardiac Rehab. Patient has no concerns about his medications at this time.   Isaias Sakai, Sherian Rein D PGY1 Pharmacy Resident  09/30/2018      4:35 PM

## 2018-10-01 ENCOUNTER — Ambulatory Visit (HOSPITAL_COMMUNITY): Payer: BLUE CROSS/BLUE SHIELD

## 2018-10-05 ENCOUNTER — Ambulatory Visit (HOSPITAL_COMMUNITY): Payer: BLUE CROSS/BLUE SHIELD

## 2018-10-05 DIAGNOSIS — S82025D Nondisplaced longitudinal fracture of left patella, subsequent encounter for closed fracture with routine healing: Secondary | ICD-10-CM | POA: Diagnosis not present

## 2018-10-05 DIAGNOSIS — S82025A Nondisplaced longitudinal fracture of left patella, initial encounter for closed fracture: Secondary | ICD-10-CM | POA: Diagnosis not present

## 2018-10-07 ENCOUNTER — Ambulatory Visit (HOSPITAL_COMMUNITY): Payer: BLUE CROSS/BLUE SHIELD

## 2018-10-09 ENCOUNTER — Ambulatory Visit (HOSPITAL_COMMUNITY): Payer: BLUE CROSS/BLUE SHIELD

## 2018-10-12 ENCOUNTER — Ambulatory Visit (HOSPITAL_COMMUNITY): Payer: BLUE CROSS/BLUE SHIELD

## 2018-10-14 ENCOUNTER — Ambulatory Visit (HOSPITAL_COMMUNITY): Payer: BLUE CROSS/BLUE SHIELD

## 2018-10-16 ENCOUNTER — Ambulatory Visit (HOSPITAL_COMMUNITY): Payer: BLUE CROSS/BLUE SHIELD

## 2018-10-19 ENCOUNTER — Ambulatory Visit (HOSPITAL_COMMUNITY): Payer: BLUE CROSS/BLUE SHIELD

## 2018-10-21 ENCOUNTER — Ambulatory Visit (HOSPITAL_COMMUNITY): Payer: BLUE CROSS/BLUE SHIELD

## 2018-10-23 ENCOUNTER — Ambulatory Visit (HOSPITAL_COMMUNITY): Payer: BLUE CROSS/BLUE SHIELD

## 2018-10-26 ENCOUNTER — Ambulatory Visit (HOSPITAL_COMMUNITY): Payer: BLUE CROSS/BLUE SHIELD

## 2018-10-28 ENCOUNTER — Ambulatory Visit (HOSPITAL_COMMUNITY): Payer: BLUE CROSS/BLUE SHIELD

## 2018-10-30 ENCOUNTER — Ambulatory Visit (HOSPITAL_COMMUNITY): Payer: BLUE CROSS/BLUE SHIELD

## 2018-10-30 DIAGNOSIS — I10 Essential (primary) hypertension: Secondary | ICD-10-CM | POA: Diagnosis not present

## 2018-10-30 DIAGNOSIS — Z1211 Encounter for screening for malignant neoplasm of colon: Secondary | ICD-10-CM | POA: Diagnosis not present

## 2018-10-30 DIAGNOSIS — C61 Malignant neoplasm of prostate: Secondary | ICD-10-CM | POA: Diagnosis not present

## 2018-10-30 DIAGNOSIS — K219 Gastro-esophageal reflux disease without esophagitis: Secondary | ICD-10-CM | POA: Diagnosis not present

## 2018-10-30 DIAGNOSIS — Z951 Presence of aortocoronary bypass graft: Secondary | ICD-10-CM | POA: Diagnosis not present

## 2018-10-30 DIAGNOSIS — I25119 Atherosclerotic heart disease of native coronary artery with unspecified angina pectoris: Secondary | ICD-10-CM | POA: Diagnosis not present

## 2018-10-30 DIAGNOSIS — Z0000 Encounter for general adult medical examination without abnormal findings: Secondary | ICD-10-CM | POA: Diagnosis not present

## 2018-10-30 DIAGNOSIS — E782 Mixed hyperlipidemia: Secondary | ICD-10-CM | POA: Diagnosis not present

## 2018-10-30 DIAGNOSIS — J309 Allergic rhinitis, unspecified: Secondary | ICD-10-CM | POA: Diagnosis not present

## 2018-11-02 ENCOUNTER — Ambulatory Visit (HOSPITAL_COMMUNITY): Payer: BLUE CROSS/BLUE SHIELD

## 2018-11-02 DIAGNOSIS — S82025D Nondisplaced longitudinal fracture of left patella, subsequent encounter for closed fracture with routine healing: Secondary | ICD-10-CM | POA: Diagnosis not present

## 2018-11-04 ENCOUNTER — Ambulatory Visit (HOSPITAL_COMMUNITY): Payer: BLUE CROSS/BLUE SHIELD

## 2018-11-06 ENCOUNTER — Ambulatory Visit (HOSPITAL_COMMUNITY): Payer: BLUE CROSS/BLUE SHIELD

## 2018-11-09 ENCOUNTER — Ambulatory Visit (HOSPITAL_COMMUNITY): Payer: BLUE CROSS/BLUE SHIELD

## 2018-11-11 ENCOUNTER — Ambulatory Visit (HOSPITAL_COMMUNITY): Payer: BLUE CROSS/BLUE SHIELD

## 2018-11-13 ENCOUNTER — Ambulatory Visit (HOSPITAL_COMMUNITY): Payer: BLUE CROSS/BLUE SHIELD

## 2018-11-16 ENCOUNTER — Ambulatory Visit (HOSPITAL_COMMUNITY): Payer: BLUE CROSS/BLUE SHIELD

## 2018-11-18 ENCOUNTER — Ambulatory Visit (HOSPITAL_COMMUNITY): Payer: BLUE CROSS/BLUE SHIELD

## 2018-11-20 ENCOUNTER — Ambulatory Visit (HOSPITAL_COMMUNITY): Payer: BLUE CROSS/BLUE SHIELD

## 2018-11-23 ENCOUNTER — Ambulatory Visit (HOSPITAL_COMMUNITY): Payer: BLUE CROSS/BLUE SHIELD

## 2018-11-25 ENCOUNTER — Ambulatory Visit (HOSPITAL_COMMUNITY): Payer: BLUE CROSS/BLUE SHIELD

## 2018-11-27 ENCOUNTER — Ambulatory Visit (HOSPITAL_COMMUNITY): Payer: BLUE CROSS/BLUE SHIELD

## 2018-11-30 ENCOUNTER — Ambulatory Visit (HOSPITAL_COMMUNITY): Payer: BLUE CROSS/BLUE SHIELD

## 2018-12-02 ENCOUNTER — Ambulatory Visit (HOSPITAL_COMMUNITY): Payer: BLUE CROSS/BLUE SHIELD

## 2018-12-04 ENCOUNTER — Ambulatory Visit (HOSPITAL_COMMUNITY): Payer: BLUE CROSS/BLUE SHIELD

## 2018-12-06 ENCOUNTER — Other Ambulatory Visit: Payer: Self-pay | Admitting: Cardiovascular Disease

## 2018-12-07 ENCOUNTER — Ambulatory Visit (HOSPITAL_COMMUNITY): Payer: BLUE CROSS/BLUE SHIELD

## 2018-12-09 ENCOUNTER — Ambulatory Visit (HOSPITAL_COMMUNITY): Payer: BLUE CROSS/BLUE SHIELD

## 2018-12-11 ENCOUNTER — Ambulatory Visit (HOSPITAL_COMMUNITY): Payer: BLUE CROSS/BLUE SHIELD

## 2018-12-14 ENCOUNTER — Ambulatory Visit (HOSPITAL_COMMUNITY): Payer: BLUE CROSS/BLUE SHIELD

## 2018-12-14 DIAGNOSIS — S82025D Nondisplaced longitudinal fracture of left patella, subsequent encounter for closed fracture with routine healing: Secondary | ICD-10-CM | POA: Diagnosis not present

## 2018-12-16 ENCOUNTER — Ambulatory Visit (HOSPITAL_COMMUNITY): Payer: BLUE CROSS/BLUE SHIELD

## 2018-12-18 ENCOUNTER — Ambulatory Visit (HOSPITAL_COMMUNITY): Payer: BLUE CROSS/BLUE SHIELD

## 2018-12-21 ENCOUNTER — Ambulatory Visit (HOSPITAL_COMMUNITY): Payer: BLUE CROSS/BLUE SHIELD

## 2018-12-23 ENCOUNTER — Ambulatory Visit (HOSPITAL_COMMUNITY): Payer: BLUE CROSS/BLUE SHIELD

## 2018-12-25 ENCOUNTER — Ambulatory Visit (HOSPITAL_COMMUNITY): Payer: BLUE CROSS/BLUE SHIELD

## 2018-12-28 ENCOUNTER — Ambulatory Visit (HOSPITAL_COMMUNITY): Payer: BLUE CROSS/BLUE SHIELD

## 2018-12-30 ENCOUNTER — Ambulatory Visit (HOSPITAL_COMMUNITY): Payer: BLUE CROSS/BLUE SHIELD

## 2019-01-01 ENCOUNTER — Ambulatory Visit (HOSPITAL_COMMUNITY): Payer: BLUE CROSS/BLUE SHIELD

## 2019-01-04 ENCOUNTER — Ambulatory Visit (HOSPITAL_COMMUNITY): Payer: BLUE CROSS/BLUE SHIELD

## 2019-01-06 ENCOUNTER — Ambulatory Visit (HOSPITAL_COMMUNITY): Payer: BLUE CROSS/BLUE SHIELD

## 2019-04-01 NOTE — Progress Notes (Signed)
Virtual Visit via Video Note   This visit type was conducted due to national recommendations for restrictions regarding the COVID-19 Pandemic (e.g. social distancing) in an effort to limit this patient's exposure and mitigate transmission in our community.  Due to his co-morbid illnesses, this patient is at least at moderate risk for complications without adequate follow up.  This format is felt to be most appropriate for this patient at this time.  All issues noted in this document were discussed and addressed.  A limited physical exam was performed with this format.  Please refer to the patient's chart for his consent to telehealth for [facility].   Date:  04/01/2019   ID:  Kurt Diaz, DOB May 04, 1941, MRN [id number]  Patient Location: Home Provider Location: Office  PCP:  Antony Contras, MD  Cardiologist:   [name] Electrophysiologist:  None   Evaluation Performed:  Follow-Up Visit  Chief Complaint:   CAD/CABG  History of Present Illness:    Kurt Diaz is a 78 y.o. male who presents for f/u CAD/CABG  MI 12-22-04 sudden death airport [place]. Stenting LAD and RCA. 10/25/16 new stents to proximal and distal RCA. EF 45-50% More angina October 2019 had CABG 07/03/18 with LIMA to LAD, SVG to D1, and SVG to RCA.  Seen back in ER 07/28/18 For chest pain. Trivial effusion , R/O CXR ok. D/c home   BP good at home    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    Past Medical History:  Diagnosis Date  . Chest pain   . Coronary artery disease   . Coronary syndrome, acute ([place]) 12/22/04  . Hyperlipidemia   . Hypertension   . Syncope and collapse    Past Surgical History:  Procedure Laterality Date  . CARDIAC CATHETERIZATION  10/13/2009   EF 65%  . CARDIAC CATHETERIZATION N/A 10/25/2016   Procedure: Left Heart  Cath and Coronary Angiography;  Surgeon: Belva Crome, MD;  Location: [place] [facility];  Service: Cardiovascular;  Laterality: N/A;  . CARDIAC CATHETERIZATION N/A 10/25/2016   Procedure: Coronary Stent Intervention;  Surgeon: Belva Crome, MD;  Location: [place] [facility];  Service: Cardiovascular;  Laterality: N/A;  . CARDIAC CATHETERIZATION N/A 10/25/2016   Procedure: Intravascular Pressure Wire/FFR Study;  Surgeon: Belva Crome, MD;  Location: [place] [facility];  Service: Cardiovascular;  Laterality: N/A;  . CARDIOVASCULAR STRESS TEST  09/29/2009   EF 70%  . CORONARY ANGIOPLASTY     STENTING OF HIS LAD, STENTING OF HIS RIGHT CORONARY.  Marland Kitchen CORONARY ARTERY BYPASS GRAFT N/A 07/03/2018   Procedure: Coronary artery bypass grafting times three using left internal mammary artery and right greater saphenous vein harvested endoscopically.;  Surgeon: Gaye Pollack, MD;  [facility];  Service: Open Heart Surgery;  Laterality: N/A;  . INTRAVASCULAR PRESSURE WIRE/FFR STUDY N/A 06/08/2018   Procedure: INTRAVASCULAR PRESSURE WIRE/FFR STUDY;  Surgeon: Nelva Bush, MD;  Location: [place] [facility];  Service: Cardiovascular;  Laterality: N/A;  . KNEE SURGERY Left   . LEFT HEART CATH AND CORONARY ANGIOGRAPHY N/A 06/08/2018   Procedure: LEFT HEART CATH AND CORONARY ANGIOGRAPHY;  Surgeon: Nelva Bush, MD;  Location: [place] [facility];  Service: Cardiovascular;  Laterality: N/A;  . TEE WITHOUT CARDIOVERSION N/A 07/03/2018   Procedure: TRANSESOPHAGEAL ECHOCARDIOGRAM (TEE);  Surgeon:  Gaye Pollack, MD;  Location: [facility];  Service: Open Heart Surgery;  Laterality: N/A;  . US ECHOCARDIOGRAPHY  12/15/2009   EF 55-60%     No outpatient medications have been marked as taking for the 04/05/19 encounter (Appointment) with Josue Hector, MD.     Allergies:   Patient has no known allergies.   Social History   Tobacco Use  . Smoking status: Never Smoker  . Smokeless tobacco: Never Used  Substance  Use Topics  . Alcohol use: No  . Drug use: No     Family Hx: The patient's family history includes Heart attack in his mother; Lung cancer in his father; Stroke in his mother.  ROS:   Please see the history of present illness.     All other systems reviewed and are negative.   Prior CV studies:   The following studies were reviewed today:  Cath 06/11/18  Labs/Other Tests and Data Reviewed:    EKG:  Not performed this visit  Recent Labs: 07/01/2018: ALT 22 07/04/2018: Magnesium 2.2 07/28/2018: BUN 10; Creatinine, Ser 0.94; Hemoglobin 14.3; Platelets 314; Potassium 4.8; Sodium 139   Recent Lipid Panel Lab Results  Component Value Date/Time   CHOL 99 (L) 12/16/2016 07:30 AM   TRIG 75 12/16/2016 07:30 AM   HDL 34 (L) 12/16/2016 07:30 AM   CHOLHDL 2.9 12/16/2016 07:30 AM   CHOLHDL 4 12/30/2014 07:41 AM   LDLCALC 50 12/16/2016 07:30 AM    Wt Readings from Last 3 Encounters:  09/03/18 183 lb (83 kg)  08/05/18 183 lb (83 kg)  07/31/18 182 lb 6.4 oz (82.7 kg)     Objective:    Vital Signs:  There were no vitals taken for this visit.   Skin warm and dry No distress No tachypnea No JVP elevation  Neuro appears non focal No edema    ASSESSMENT & PLAN:    1. CAD with CABG X 3 07/03/18 sternum well healed continue cardiac rehab. ASA and statin   2.  CHest pain seen in ER with neg MI, trivial effusion bedside echo resolved observe   3.  HLD : continue high dose statin labs with primary   4.  HTN:  He will monitor at home if high we will increase lisinopril to 5 mg daily   F/U with me in a year   COVID-19 Education: The signs and symptoms of COVID-19 were discussed with the patient and how to seek care for testing (follow up with PCP or arrange E-visit).  The importance of social distancing was discussed today.  Time:   Today, I have spent 30 minutes with the patient with telehealth technology discussing the above problems.     Medication Adjustments/Labs  and Tests Ordered: Current medicines are reviewed at length with the patient today.  Concerns regarding medicines are outlined above.   Tests Ordered:  None   Medication Changes:  None   Disposition:  Follow up in a year  Signed, Jenkins Rouge, MD  04/01/2019 5:51 PM    [place]

## 2019-04-02 ENCOUNTER — Telehealth: Payer: Self-pay | Admitting: Cardiovascular Disease

## 2019-04-02 NOTE — Telephone Encounter (Signed)

## 2019-04-05 ENCOUNTER — Other Ambulatory Visit: Payer: Self-pay

## 2019-04-05 ENCOUNTER — Ambulatory Visit (INDEPENDENT_AMBULATORY_CARE_PROVIDER_SITE_OTHER): Payer: Self-pay | Admitting: Cardiovascular Disease

## 2019-04-05 ENCOUNTER — Telehealth: Payer: Self-pay

## 2019-04-05 DIAGNOSIS — I2510 Atherosclerotic heart disease of native coronary artery without angina pectoris: Secondary | ICD-10-CM

## 2019-04-05 NOTE — Telephone Encounter (Addendum)
I have attempted to contact this patient by phone about appointment with Dr. [name] on 7/13 at 8:40 am, but mailbox was full on both numbers listed in [place].  I will continue to try later.

## 2019-04-20 DIAGNOSIS — C61 Malignant neoplasm of prostate: Secondary | ICD-10-CM | POA: Diagnosis not present

## 2019-04-27 DIAGNOSIS — R3912 Poor urinary stream: Secondary | ICD-10-CM | POA: Diagnosis not present

## 2019-04-27 DIAGNOSIS — N401 Enlarged prostate with lower urinary tract symptoms: Secondary | ICD-10-CM | POA: Diagnosis not present

## 2019-04-27 DIAGNOSIS — C61 Malignant neoplasm of prostate: Secondary | ICD-10-CM | POA: Diagnosis not present

## 2019-04-30 DIAGNOSIS — I10 Essential (primary) hypertension: Secondary | ICD-10-CM | POA: Diagnosis not present

## 2019-04-30 DIAGNOSIS — Z951 Presence of aortocoronary bypass graft: Secondary | ICD-10-CM | POA: Diagnosis not present

## 2019-04-30 DIAGNOSIS — J309 Allergic rhinitis, unspecified: Secondary | ICD-10-CM | POA: Diagnosis not present

## 2019-04-30 DIAGNOSIS — E782 Mixed hyperlipidemia: Secondary | ICD-10-CM | POA: Diagnosis not present

## 2019-04-30 DIAGNOSIS — K219 Gastro-esophageal reflux disease without esophagitis: Secondary | ICD-10-CM | POA: Diagnosis not present

## 2019-04-30 DIAGNOSIS — C61 Malignant neoplasm of prostate: Secondary | ICD-10-CM | POA: Diagnosis not present

## 2019-04-30 DIAGNOSIS — I25119 Atherosclerotic heart disease of native coronary artery with unspecified angina pectoris: Secondary | ICD-10-CM | POA: Diagnosis not present

## 2019-05-13 NOTE — Progress Notes (Signed)
Date:  05/25/2019   ID:  Kurt Diaz, DOB 23-Dec-1940, MRN [id number]  Provider Location: Office  PCP:  Antony Contras, MD  Cardiologist:   [name] Electrophysiologist:  None   Evaluation Performed:  Follow-Up Visit  Chief Complaint:   CAD/CABG  History of Present Illness:    Kurt Diaz is a 78 y.o. male who presents for f/u CAD/CABG  MI 01-09-2005 sudden death airport [place]. Stenting LAD and RCA. 10/25/16 new stents to proximal and distal RCA. EF 45-50% More angina October 2019 had CABG 07/03/18 with LIMA to LAD, SVG to D1, and SVG to RCA.  Seen back in ER 07/28/18 For chest pain. Trivial effusion , R/O CXR ok. D/c home   BP good at home  Some white coat component   Travels a lot [place] originally from [place]. 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 Fell and cracked left patella a few weeks ago healing well Followed by [employer]   The patient  does not have symptoms concerning for COVID-19 infection (fever, chills, cough, or new shortness of breath).    Past Medical History:  Diagnosis Date  . Chest pain   . Coronary artery disease   . Coronary syndrome, acute ([place]) 01/09/05  . Hyperlipidemia   . Hypertension   . Syncope and collapse    Past Surgical History:  Procedure Laterality Date  . CARDIAC CATHETERIZATION  10/13/2009   EF 65%  . CARDIAC CATHETERIZATION N/A 10/25/2016   Procedure: Left Heart Cath and Coronary Angiography;  Surgeon: Belva Crome, MD;  Location: [place] [facility];  Service: Cardiovascular;  Laterality: N/A;  . CARDIAC CATHETERIZATION N/A 10/25/2016   Procedure: Coronary Stent Intervention;  Surgeon: Belva Crome, MD;  Location: [place] [facility];  Service: Cardiovascular;  Laterality: N/A;  . CARDIAC CATHETERIZATION N/A 10/25/2016   Procedure: Intravascular Pressure Wire/FFR Study;  Surgeon: Belva Crome, MD;  Location: [place] [facility];  Service: Cardiovascular;   Laterality: N/A;  . CARDIOVASCULAR STRESS TEST  09/29/2009   EF 70%  . CORONARY ANGIOPLASTY     STENTING OF HIS LAD, STENTING OF HIS RIGHT CORONARY.  Marland Kitchen CORONARY ARTERY BYPASS GRAFT N/A 07/03/2018   Procedure: Coronary artery bypass grafting times three using left internal mammary artery and right greater saphenous vein harvested endoscopically.;  Surgeon: Gaye Pollack, MD;  [facility];  Service: Open Heart Surgery;  Laterality: N/A;  . INTRAVASCULAR PRESSURE WIRE/FFR STUDY N/A 06/08/2018   Procedure: INTRAVASCULAR PRESSURE WIRE/FFR STUDY;  Surgeon: Nelva Bush, MD;  Location: [place] [facility];  Service: Cardiovascular;  Laterality: N/A;  . KNEE SURGERY Left   . LEFT HEART CATH AND CORONARY ANGIOGRAPHY N/A 06/08/2018   Procedure: LEFT HEART CATH AND CORONARY ANGIOGRAPHY;  Surgeon: Nelva Bush, MD;  Location: [place] [facility];  Service: Cardiovascular;  Laterality: N/A;  . TEE WITHOUT CARDIOVERSION N/A 07/03/2018   Procedure: TRANSESOPHAGEAL ECHOCARDIOGRAM (TEE);  Surgeon: Gaye Pollack, MD;  Location: [place];  Service: Open Heart Surgery;  Laterality: N/A;  . US ECHOCARDIOGRAPHY  Jan 09, 2010   EF 55-60%     Current Meds  Medication Sig  . acetaminophen (TYLENOL) 325 MG tablet Take 2 tablets (650 mg total) by mouth every 6 (six) hours as needed for mild pain.  Marland Kitchen aspirin EC 325 MG EC tablet Take 1 tablet (325 mg total) by mouth daily.  Marland Kitchen atorvastatin (LIPITOR) 80 MG tablet  TAKE 1 TABLET(80 MG) BY MOUTH DAILY AT 6 PM  . lisinopril (PRINIVIL,ZESTRIL) 2.5 MG tablet TAKE 1 TABLET BY MOUTH EVERY DAY  . metoprolol tartrate (LOPRESSOR) 25 MG tablet TAKE 1/2 TABLET(12.5 MG TOTAL) BY MOUTH TWICE DAILY  . Multiple Vitamin (MULTIVITAMIN WITH MINERALS) TABS tablet Take 1 tablet by mouth daily after supper.   . nitroGLYCERIN (NITROSTAT) 0.4 MG SL tablet Place 0.4 mg under the tongue as needed for chest pain.     Allergies:   Patient has no known allergies.   Social History   Tobacco  Use  . Smoking status: Never Smoker  . Smokeless tobacco: Never Used  Substance Use Topics  . Alcohol use: No  . Drug use: No     Family Hx: The patient's family history includes Heart attack in his mother; Lung cancer in his father; Stroke in his mother.  ROS:   Please see the history of present illness.     All other systems reviewed and are negative.   Prior CV studies:   The following studies were reviewed today:  Cath 06/11/18  Labs/Other Tests and Data Reviewed:    EKG:  Not performed this visit  Recent Labs: 07/01/2018: ALT 22 07/04/2018: Magnesium 2.2 07/28/2018: BUN 10; Creatinine, Ser 0.94; Hemoglobin 14.3; Platelets 314; Potassium 4.8; Sodium 139   Recent Lipid Panel Lab Results  Component Value Date/Time   CHOL 99 (L) 12/16/2016 07:30 AM   TRIG 75 12/16/2016 07:30 AM   HDL 34 (L) 12/16/2016 07:30 AM   CHOLHDL 2.9 12/16/2016 07:30 AM   CHOLHDL 4 12/30/2014 07:41 AM   LDLCALC 50 12/16/2016 07:30 AM    Wt Readings from Last 3 Encounters:  05/25/19 189 lb (85.7 kg)  09/03/18 183 lb (83 kg)  08/05/18 183 lb (83 kg)     Objective:    Vital Signs:  BP 120/60   Pulse 76   Ht 5\' 8"  (1.727 m)   Wt 189 lb (85.7 kg)   SpO2 97%   BMI 28.74 kg/m    Affect appropriate Healthy:  appears stated age 48: normal Neck supple with no adenopathy JVP normal no bruits no thyromegaly Lungs clear with no wheezing and good diaphragmatic motion Heart:  S1/S2 no murmur, no rub, gallop or click PMI normal Abdomen: benighn, BS positve, no tenderness, no AAA no bruit.  No HSM or HJR Distal pulses intact with no bruits No edema Neuro non-focal Skin warm and dry No muscular weakness    ASSESSMENT & PLAN:    1. CAD with CABG X 3 07/03/18 . ASA 81 mg dose decreased  and statin   2.  Chest pain seen in ER with neg MI, trivial effusion bedside echo resolved observe   3.  HLD : continue high dose statin labs with primary   4.  HTN:  He will monitor at home  if high we will increase lisinopril to 5 mg daily   F/U with me in a year   COVID-19 Education: The signs and symptoms of COVID-19 were discussed with the patient and how to seek care for testing (follow up with PCP or arrange E-visit).  The importance of social distancing was discussed today.  Time:   Today, I have spent 30 minutes with the patient     Medication Adjustments/Labs and Tests Ordered: Current medicines are reviewed at length with the patient today.  Concerns regarding medicines are outlined above.   Tests Ordered:  None   Medication Changes:  None  Disposition:  Follow up in a year  Signed, Jenkins Rouge, MD  05/25/2019 3:43 PM    [place]

## 2019-05-25 ENCOUNTER — Ambulatory Visit (INDEPENDENT_AMBULATORY_CARE_PROVIDER_SITE_OTHER): Payer: Medicare Other | Admitting: Cardiovascular Disease

## 2019-05-25 ENCOUNTER — Encounter: Payer: Self-pay | Admitting: Cardiovascular Disease

## 2019-05-25 ENCOUNTER — Other Ambulatory Visit: Payer: Self-pay

## 2019-05-25 VITALS — BP 120/60 | HR 76 | Ht 68.0 in | Wt 189.0 lb

## 2019-05-25 DIAGNOSIS — I2510 Atherosclerotic heart disease of native coronary artery without angina pectoris: Secondary | ICD-10-CM | POA: Diagnosis not present

## 2019-05-25 DIAGNOSIS — Z951 Presence of aortocoronary bypass graft: Secondary | ICD-10-CM | POA: Diagnosis not present

## 2019-05-25 NOTE — Patient Instructions (Signed)
Your physician recommends that you continue on your current medications as directed. Please refer to the Current Medication list given to you today.  Your physician wants you to follow-up in: YEAR WITH DR NISHAN You will receive a reminder letter in the mail two months in advance. If you don't receive a letter, please call our office to schedule the follow-up appointment.  

## 2019-07-21 DIAGNOSIS — Z961 Presence of intraocular lens: Secondary | ICD-10-CM | POA: Diagnosis not present

## 2019-07-21 DIAGNOSIS — H31091 Other chorioretinal scars, right eye: Secondary | ICD-10-CM | POA: Diagnosis not present

## 2019-07-21 DIAGNOSIS — H33051 Total retinal detachment, right eye: Secondary | ICD-10-CM | POA: Diagnosis not present

## 2019-07-21 DIAGNOSIS — Z9889 Other specified postprocedural states: Secondary | ICD-10-CM | POA: Diagnosis not present

## 2019-07-21 DIAGNOSIS — H04123 Dry eye syndrome of bilateral lacrimal glands: Secondary | ICD-10-CM | POA: Diagnosis not present

## 2019-07-21 DIAGNOSIS — D492 Neoplasm of unspecified behavior of bone, soft tissue, and skin: Secondary | ICD-10-CM | POA: Diagnosis not present

## 2019-07-22 DIAGNOSIS — Z23 Encounter for immunization: Secondary | ICD-10-CM | POA: Diagnosis not present

## 2019-08-03 DIAGNOSIS — Z23 Encounter for immunization: Secondary | ICD-10-CM | POA: Diagnosis not present

## 2019-08-23 DIAGNOSIS — H31091 Other chorioretinal scars, right eye: Secondary | ICD-10-CM | POA: Diagnosis not present

## 2019-08-23 DIAGNOSIS — H26492 Other secondary cataract, left eye: Secondary | ICD-10-CM | POA: Diagnosis not present

## 2019-08-23 DIAGNOSIS — H04123 Dry eye syndrome of bilateral lacrimal glands: Secondary | ICD-10-CM | POA: Diagnosis not present

## 2019-08-23 DIAGNOSIS — H26491 Other secondary cataract, right eye: Secondary | ICD-10-CM | POA: Diagnosis not present

## 2019-08-23 DIAGNOSIS — Z9889 Other specified postprocedural states: Secondary | ICD-10-CM | POA: Diagnosis not present

## 2019-08-23 DIAGNOSIS — Z961 Presence of intraocular lens: Secondary | ICD-10-CM | POA: Diagnosis not present

## 2019-08-23 DIAGNOSIS — H33051 Total retinal detachment, right eye: Secondary | ICD-10-CM | POA: Diagnosis not present

## 2019-08-23 DIAGNOSIS — H538 Other visual disturbances: Secondary | ICD-10-CM | POA: Diagnosis not present

## 2019-08-23 DIAGNOSIS — D492 Neoplasm of unspecified behavior of bone, soft tissue, and skin: Secondary | ICD-10-CM | POA: Diagnosis not present

## 2019-08-24 DIAGNOSIS — H538 Other visual disturbances: Secondary | ICD-10-CM | POA: Diagnosis not present

## 2019-08-24 DIAGNOSIS — H26493 Other secondary cataract, bilateral: Secondary | ICD-10-CM | POA: Diagnosis not present

## 2019-08-26 DIAGNOSIS — H26491 Other secondary cataract, right eye: Secondary | ICD-10-CM | POA: Diagnosis not present

## 2019-09-19 IMAGING — DX DG CHEST 1V PORT
1 series · 1 of 1 positions shown · non-contrast
Comparison: 07/03/2018

CLINICAL DATA: Post CABG.

EXAM:
PORTABLE CHEST 1 VIEW

[chest]
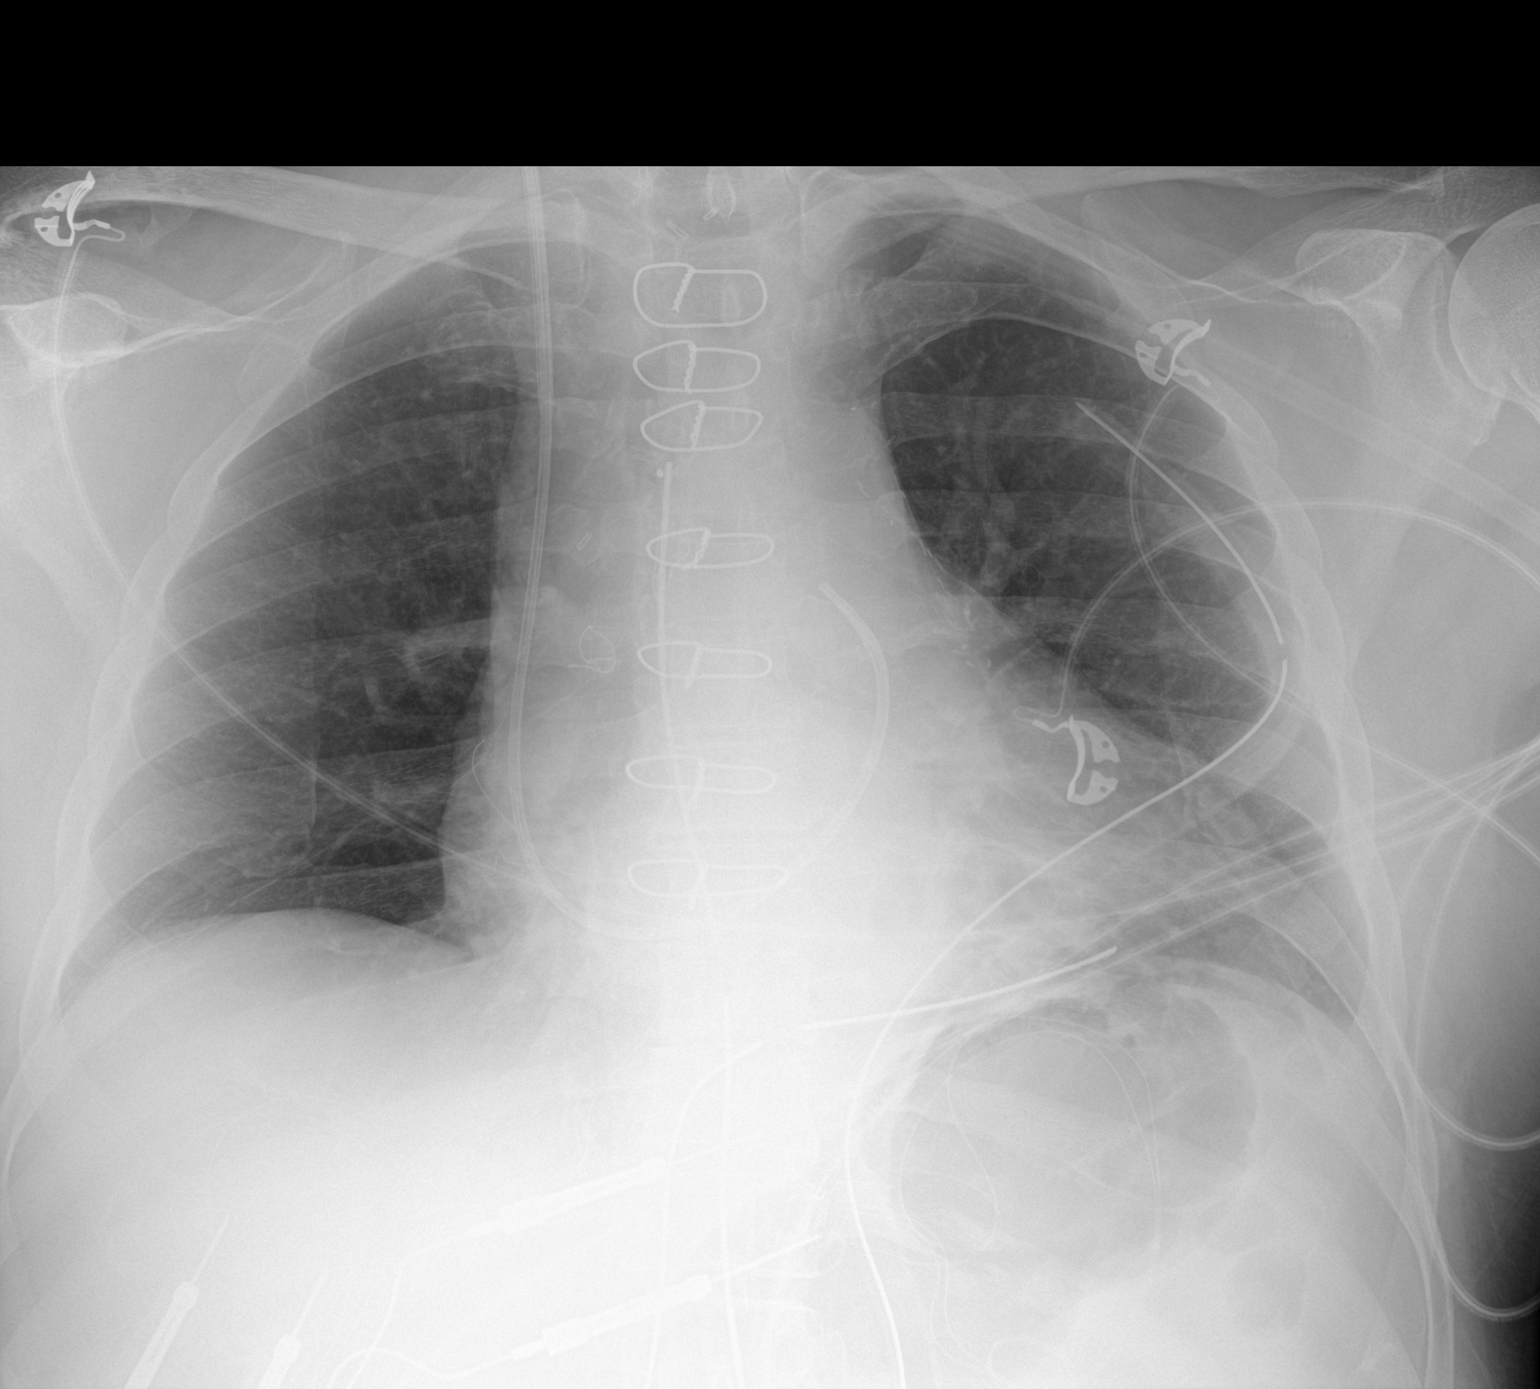

[1 of 1 positions shown; findings below may reference images not displayed]

FINDINGS: Endotracheal tube and nasogastric tube have been removed. Swan-Ganz
catheter Roll stable position and located in the main pulmonary
artery versus proximal right pulmonary artery. Left chest and
mediastinal drains are stable in position. Negative for
pneumothorax. Minimal atelectasis at the left lung base. Right lung
is clear. Heart size is stable with post CABG changes.
IMPRESSION: No acute findings.

Support apparatuses as described.  Negative for a pneumothorax.

## 2019-09-21 IMAGING — DX DG CHEST 2V
2 series · 2 of 2 positions shown · non-contrast
Comparison: Chest x-ray of July 03, 2018.

CLINICAL DATA: Status post CABG on July 03, 2018

EXAM:
CHEST - 2 VIEW

[chest pa]
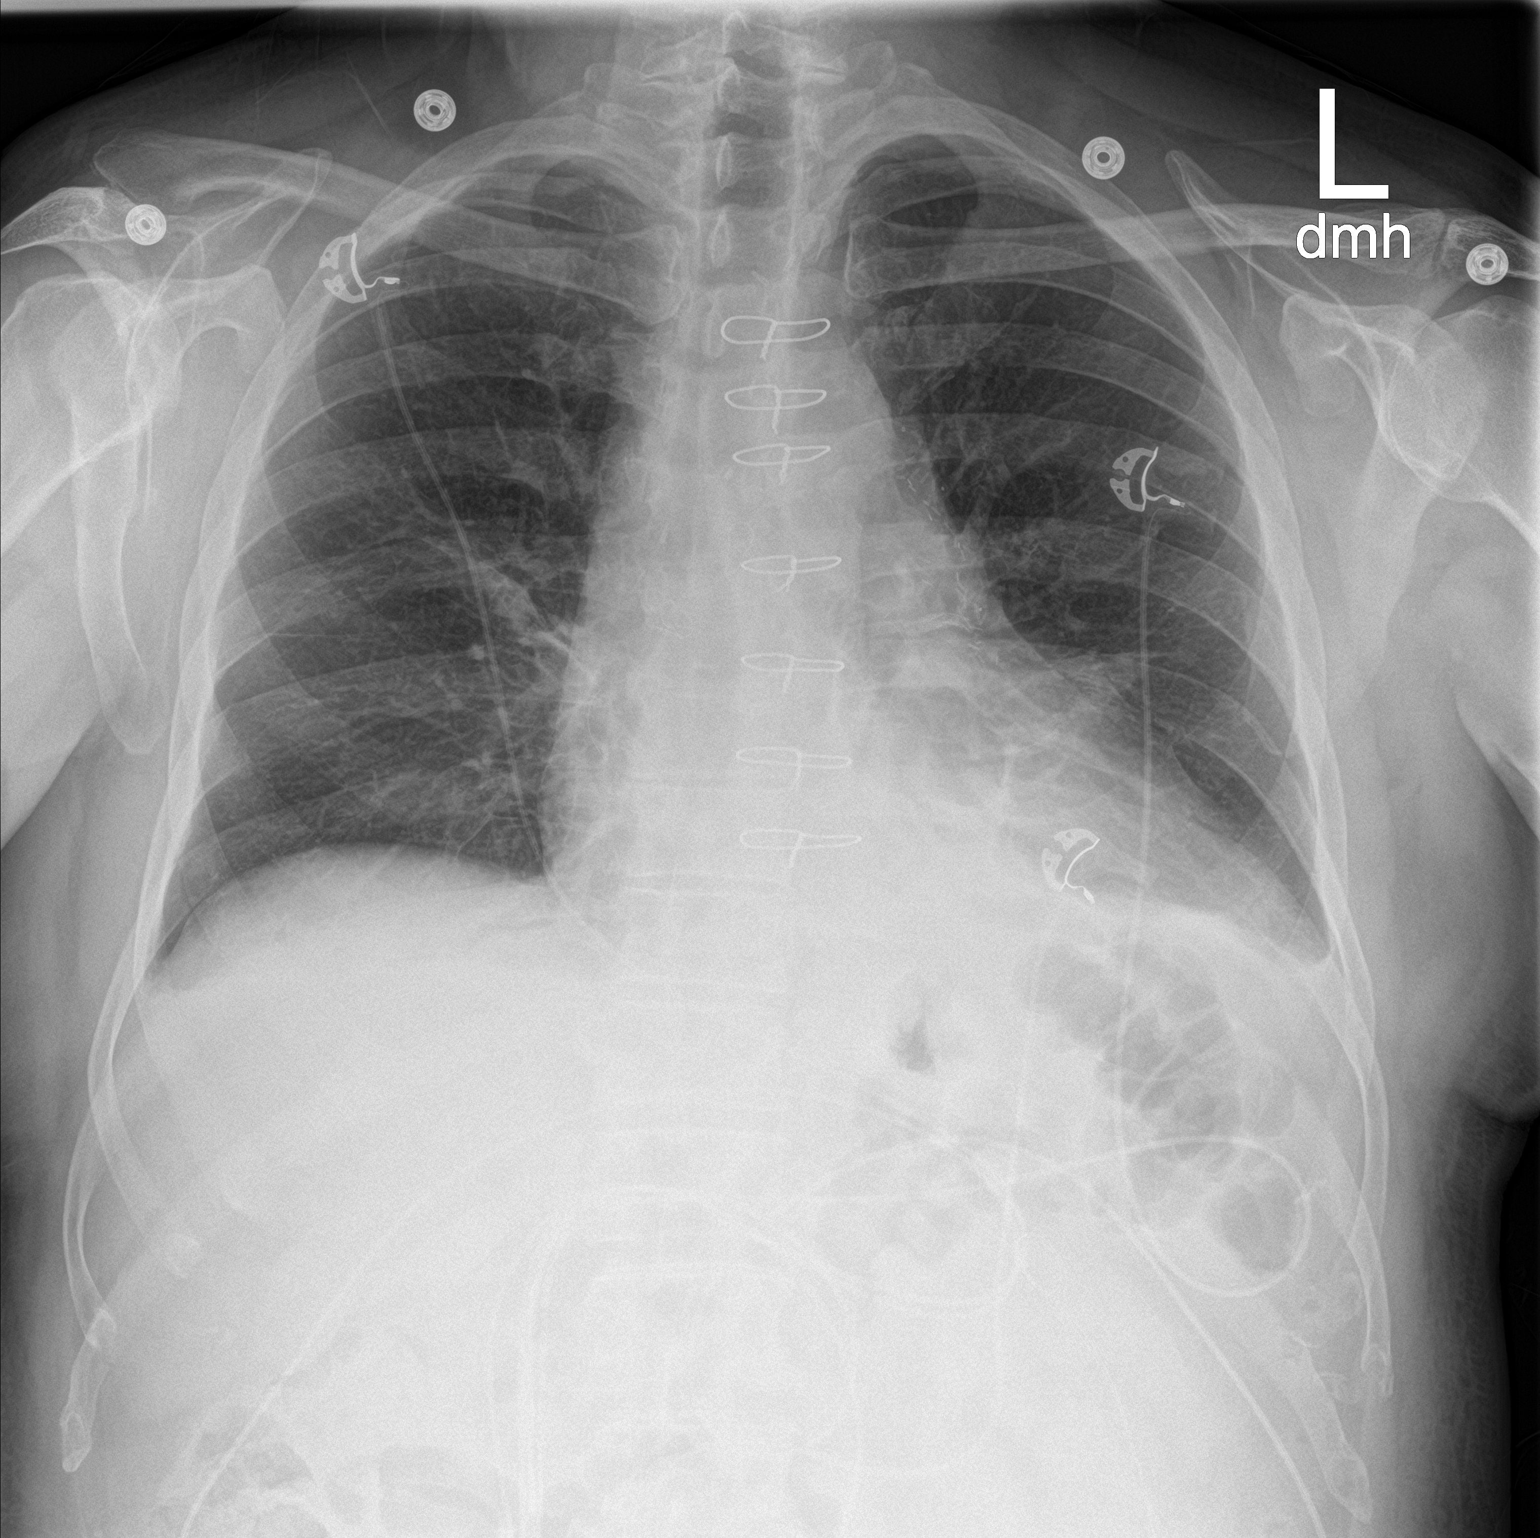

[chest lat]
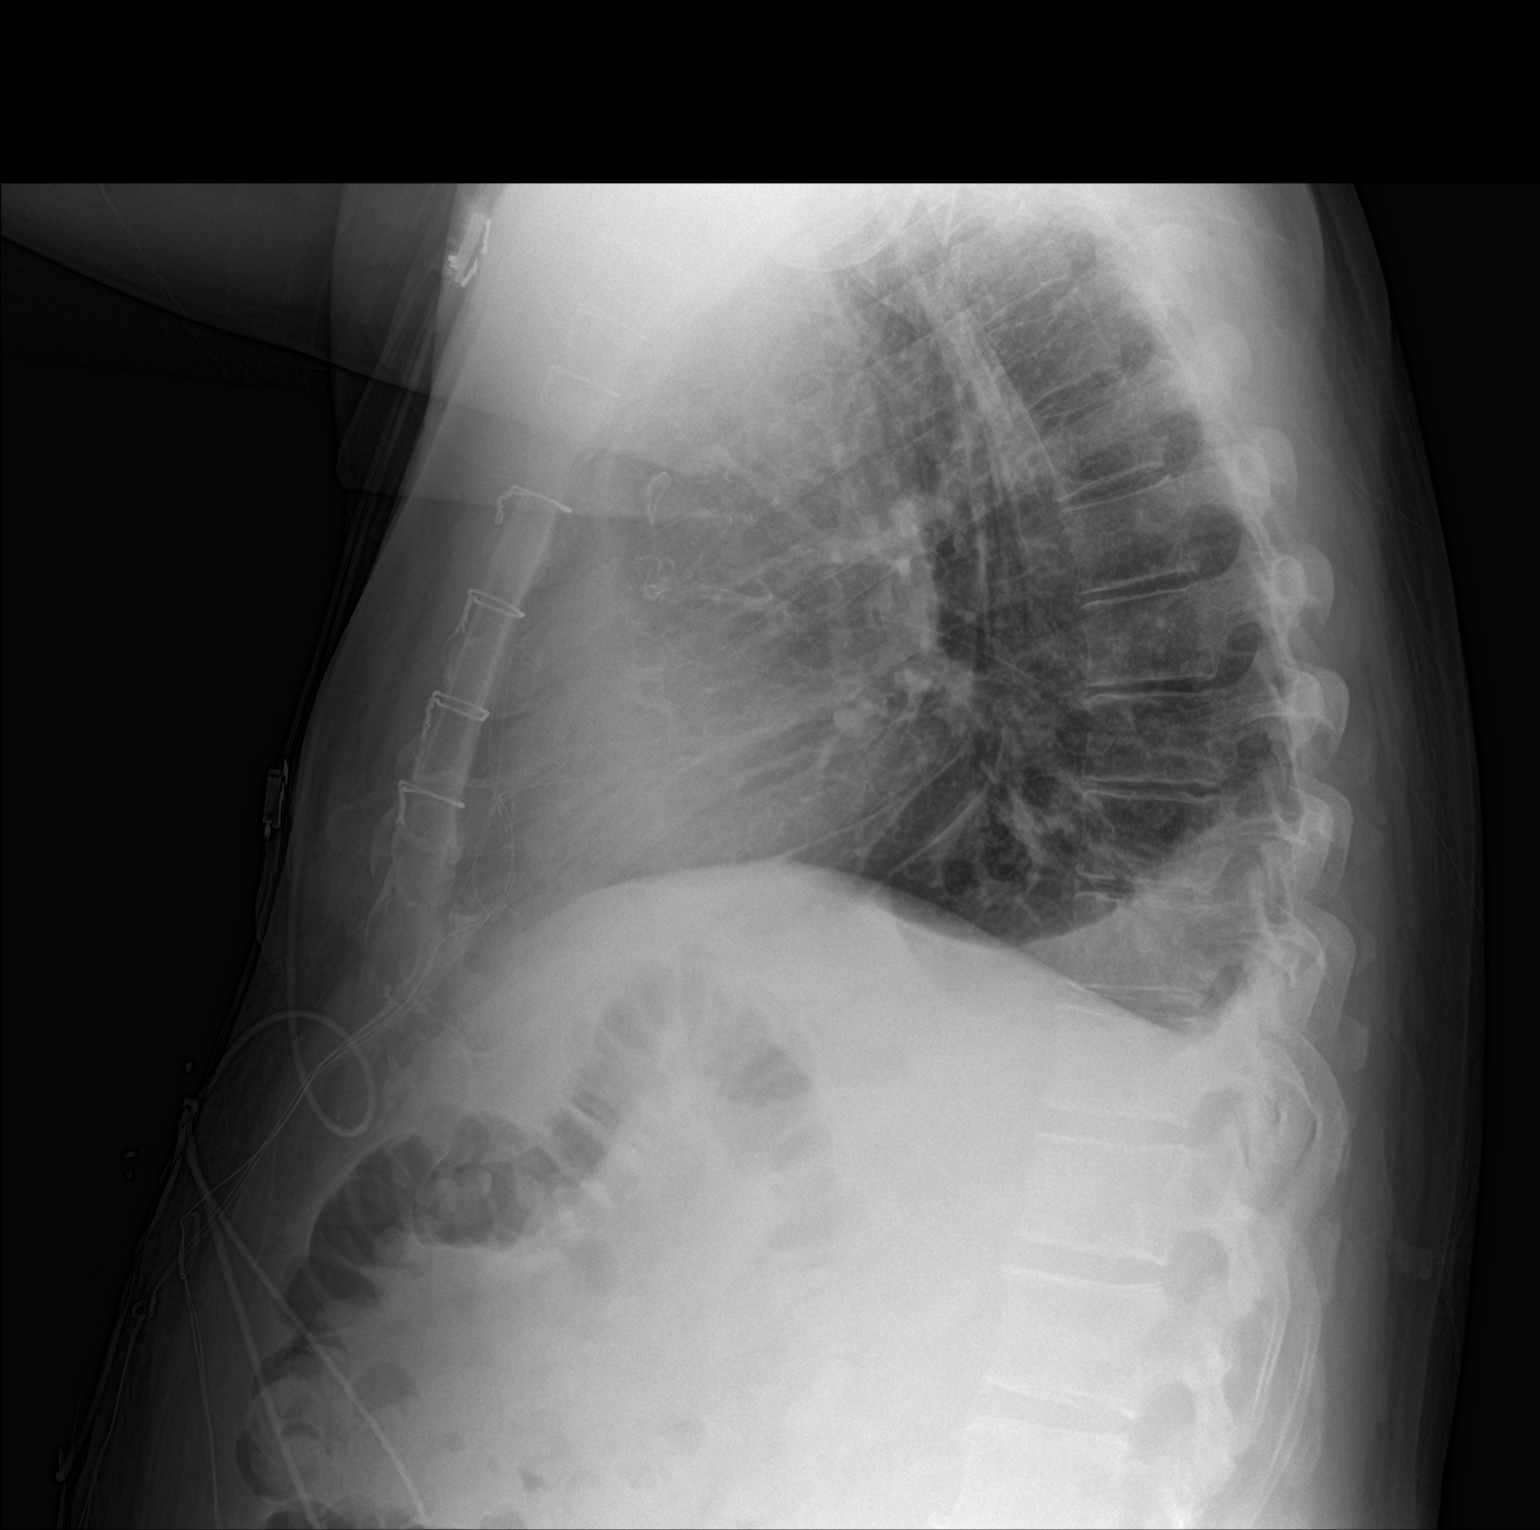

[2 of 2 positions shown; findings below may reference images not displayed]

FINDINGS: The lungs are adequately inflated. Left basilar density has
improved. Perihilar subsegmental atelectasis has also improved
slightly. There small bilateral pleural effusions. There is no
pneumothorax. The heart is top-normal in size. The pulmonary
vascularity is normal. The mediastinum is normal in width. The
sternal wires are intact. The right internal jugular Cordis sheath
has been removed as has the left chest tube.
IMPRESSION: Small bilateral pleural effusions. Persistent left lower lobe
atelectasis as well as subsegmental hilar atelectasis bilaterally.
No pulmonary edema or pneumothorax.

## 2019-09-23 ENCOUNTER — Other Ambulatory Visit: Payer: Self-pay | Admitting: Cardiovascular Disease

## 2019-10-13 IMAGING — DX DG CHEST 2V
2 series · 2 of 2 positions shown · non-contrast
Comparison: Chest x-ray of July 06, 2018

CLINICAL DATA: Status post CABG 25 days ago with onset of pain
today described is left-sided pressure sensation without radiation.

EXAM:
CHEST - 2 VIEW

[w chest pa]
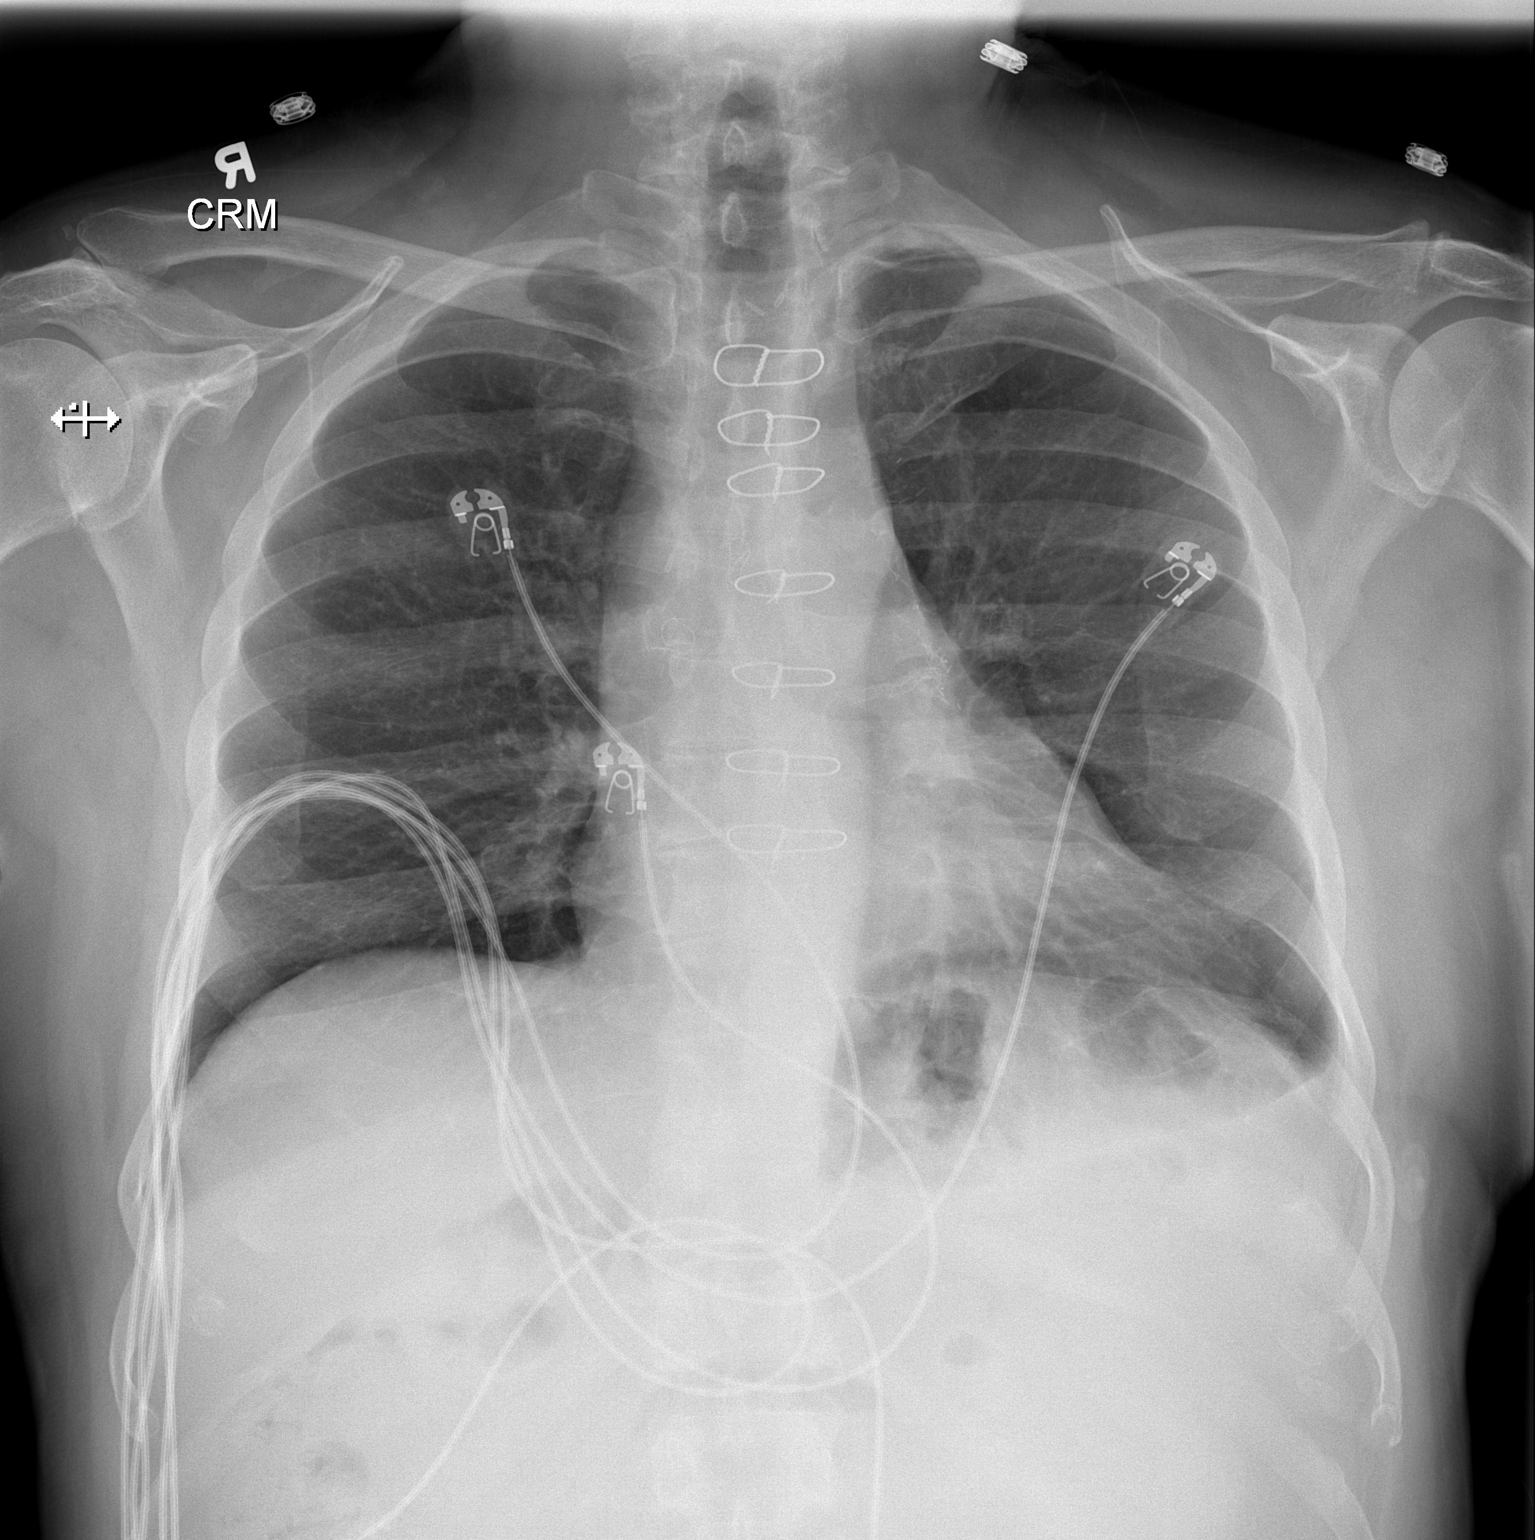

[w chest lat]
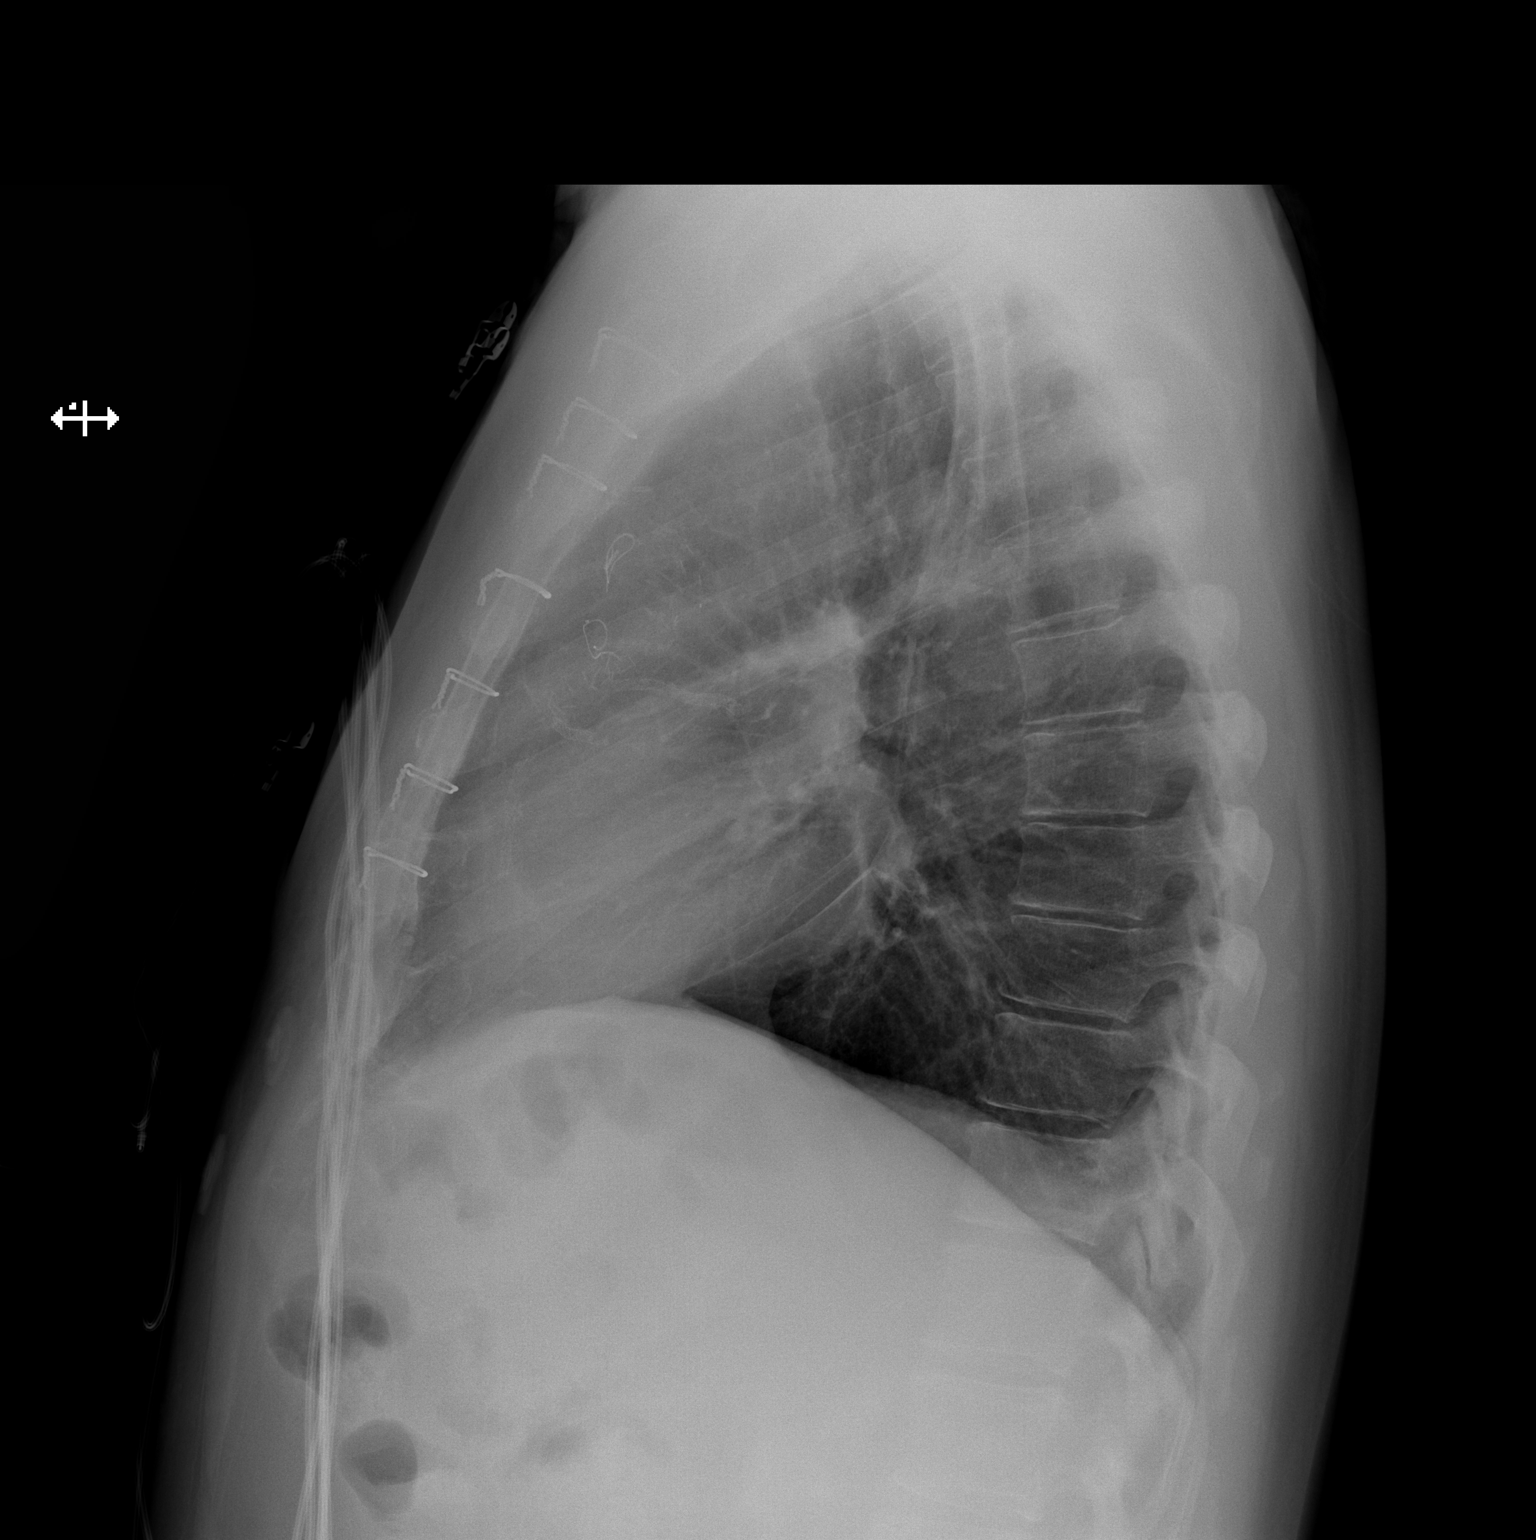

[2 of 2 positions shown; findings below may reference images not displayed]

FINDINGS: The right lung is adequately inflated and clear. On the left there
is a persistent small pleural effusion. There is no pneumothorax or
pneumomediastinum. There is no alveolar infiltrate. The heart and
pulmonary vascularity are normal. The sternal wires are intact.
Coronary artery stents are visible as well as graft markers. The
bony thorax exhibits no acute abnormality.
IMPRESSION: Small residual left pleural effusion. Otherwise clear lungs with no
evidence of pneumothorax.

Normal appearance of the heart and pulmonary vascularity. Normal
retrosternal region.

## 2019-10-22 ENCOUNTER — Ambulatory Visit: Payer: Medicare Other

## 2019-10-30 ENCOUNTER — Ambulatory Visit: Payer: Medicare Other | Attending: Internal Medicine

## 2019-10-30 DIAGNOSIS — Z23 Encounter for immunization: Secondary | ICD-10-CM

## 2019-10-30 NOTE — Progress Notes (Signed)
   Covid-19 Vaccination Clinic  Name:  Kurt Diaz    MRN: [id number] DOB: 09/21/1941  10/30/2019  Kurt Diaz was observed post Covid-19 immunization for 15 minutes without incidence. He was provided with Vaccine Information Sheet and instruction to access the V-Safe system.   Kurt Diaz was instructed to call 911 with any severe reactions post vaccine: Marland Kitchen Difficulty breathing  . Swelling of your face and throat  . A fast heartbeat  . A bad rash all over your body  . Dizziness and weakness    Immunizations Administered    Name Date Dose VIS Date Route   Pfizer COVID-19 Vaccine 10/30/2019  2:30 PM 0.3 mL 09/03/2019 Intramuscular   Manufacturer: [place]   Lot: [id number]   [place]: [id number]

## 2019-11-03 DIAGNOSIS — C61 Malignant neoplasm of prostate: Secondary | ICD-10-CM | POA: Diagnosis not present

## 2019-11-10 DIAGNOSIS — C61 Malignant neoplasm of prostate: Secondary | ICD-10-CM | POA: Diagnosis not present

## 2019-11-16 ENCOUNTER — Other Ambulatory Visit: Payer: Self-pay | Admitting: Urology

## 2019-11-16 DIAGNOSIS — C61 Malignant neoplasm of prostate: Secondary | ICD-10-CM

## 2019-11-24 ENCOUNTER — Ambulatory Visit: Payer: Medicare Other | Attending: Internal Medicine

## 2019-11-24 DIAGNOSIS — Z23 Encounter for immunization: Secondary | ICD-10-CM

## 2019-11-24 NOTE — Progress Notes (Signed)
   Covid-19 Vaccination Clinic  Name:  Kurt Diaz    MRN: [id number] DOB: 1941/03/12  11/24/2019  Mr. Parchment was observed post Covid-19 immunization for 15 minutes without incident. He was provided with Vaccine Information Sheet and instruction to access the V-Safe system.   Mr. Rabe was instructed to call 911 with any severe reactions post vaccine: Marland Kitchen Difficulty breathing  . Swelling of face and throat  . A fast heartbeat  . A bad rash all over body  . Dizziness and weakness   Immunizations Administered    Name Date Dose VIS Date Route   Pfizer COVID-19 Vaccine 11/24/2019 10:24 AM 0.3 mL 09/03/2019 Intramuscular   Manufacturer: [place]   Lot: [id number]   [place]: [id number]

## 2019-12-06 ENCOUNTER — Ambulatory Visit
Admission: RE | Admit: 2019-12-06 | Discharge: 2019-12-06 | Disposition: A | Discharge status: Home / Self Care | Payer: Medicare Other | Source: Ambulatory Visit | Attending: Urology | Admitting: Urology

## 2019-12-06 DIAGNOSIS — C61 Malignant neoplasm of prostate: Secondary | ICD-10-CM

## 2019-12-06 MED ORDER — GADOBENATE DIMEGLUMINE 529 MG/ML IV SOLN
17.0000 mL | Freq: Once | INTRAVENOUS | Status: AC | PRN
  Administered 2019-12-06: 17 mL via INTRAVENOUS

## 2019-12-10 DIAGNOSIS — Z1389 Encounter for screening for other disorder: Secondary | ICD-10-CM | POA: Diagnosis not present

## 2019-12-10 DIAGNOSIS — K219 Gastro-esophageal reflux disease without esophagitis: Secondary | ICD-10-CM | POA: Diagnosis not present

## 2019-12-10 DIAGNOSIS — Z0000 Encounter for general adult medical examination without abnormal findings: Secondary | ICD-10-CM | POA: Diagnosis not present

## 2019-12-10 DIAGNOSIS — Z951 Presence of aortocoronary bypass graft: Secondary | ICD-10-CM | POA: Diagnosis not present

## 2019-12-10 DIAGNOSIS — C61 Malignant neoplasm of prostate: Secondary | ICD-10-CM | POA: Diagnosis not present

## 2019-12-10 DIAGNOSIS — I25119 Atherosclerotic heart disease of native coronary artery with unspecified angina pectoris: Secondary | ICD-10-CM | POA: Diagnosis not present

## 2019-12-10 DIAGNOSIS — J309 Allergic rhinitis, unspecified: Secondary | ICD-10-CM | POA: Diagnosis not present

## 2019-12-10 DIAGNOSIS — Z1211 Encounter for screening for malignant neoplasm of colon: Secondary | ICD-10-CM | POA: Diagnosis not present

## 2019-12-10 DIAGNOSIS — I10 Essential (primary) hypertension: Secondary | ICD-10-CM | POA: Diagnosis not present

## 2019-12-10 DIAGNOSIS — E782 Mixed hyperlipidemia: Secondary | ICD-10-CM | POA: Diagnosis not present

## 2019-12-29 DIAGNOSIS — C61 Malignant neoplasm of prostate: Secondary | ICD-10-CM | POA: Diagnosis not present

## 2020-01-04 DIAGNOSIS — C61 Malignant neoplasm of prostate: Secondary | ICD-10-CM | POA: Diagnosis not present

## 2020-01-24 NOTE — Progress Notes (Signed)
GU Location of Tumor / Histology: prostatic adenocarcinoma  If Prostate Cancer, Gleason Score is (4 + 3) and PSA is (30.20). Prostate volume: 73.62 g  [name] has been under active surveillance since January 2011 when he was originally dx with gleason 3+3 prostatic adenocarcinoma with a psa 5.44.     Biopsies of prostate (if applicable) revealed:   Past/Anticipated interventions by urology, if any: prostate biopsy, active surveillance, prostate biopsy, referral for consideration of radiation therapy  Past/Anticipated interventions by medical oncology, if any: no  Weight changes, if any: no  Bowel/Bladder complaints, if any:    Nausea/Vomiting, if any: no  Pain issues, if any:    SAFETY ISSUES: Prior radiation?  Pacemaker/ICD?  Possible current pregnancy? no, male patient Is the patient on methotrexate?   Current Complaints / other details:  79 year old male. Married. Never a smoker. Father with a hx of lung ca.

## 2020-01-25 ENCOUNTER — Ambulatory Visit: Payer: Medicare Other | Admitting: Radiation Oncology

## 2020-01-25 ENCOUNTER — Telehealth: Payer: Medicare Other | Admitting: Radiation Oncology

## 2020-01-25 ENCOUNTER — Ambulatory Visit
Admission: RE | Admit: 2020-01-25 | Discharge: 2020-01-25 | Disposition: A | Discharge status: Home / Self Care | Payer: Medicare Other | Source: Ambulatory Visit | Attending: Radiation Oncology | Admitting: Radiation Oncology

## 2020-01-25 ENCOUNTER — Other Ambulatory Visit: Payer: Self-pay

## 2020-01-25 ENCOUNTER — Encounter: Payer: Self-pay | Admitting: Radiation Oncology

## 2020-01-25 ENCOUNTER — Telehealth: Payer: Self-pay | Admitting: Radiation Oncology

## 2020-01-25 VITALS — Ht 68.0 in | Wt 188.0 lb

## 2020-01-25 DIAGNOSIS — R972 Elevated prostate specific antigen [PSA]: Secondary | ICD-10-CM | POA: Diagnosis not present

## 2020-01-25 DIAGNOSIS — C61 Malignant neoplasm of prostate: Secondary | ICD-10-CM | POA: Diagnosis not present

## 2020-01-25 HISTORY — DX: Malignant neoplasm of prostate: C61

## 2020-01-25 NOTE — Progress Notes (Signed)
Radiation Oncology         [phone] ________________________________  Initial Outpatient Consultation - Conducted via MyChart due to current COVID-19 concerns for limiting patient exposure  Name: Kurt Diaz MRN: [id number]  Date: 01/25/2020  DOB: Jan 17, 1941  [id number], Shanon Brow, MD  Kathie Rhodes, MD   REFERRING PHYSICIAN: Kathie Rhodes, MD  DIAGNOSIS: 79 y.o. gentleman with Stage T1c adenocarcinoma of the prostate with Gleason score of 4+3, and PSA of 30.2.    ICD-10-CM   1. Malignant neoplasm of prostate ([place])  C61   2. Prostate cancer ([place])  C61     HISTORY OF PRESENT ILLNESS: Kurt Diaz is a 79 y.o. male with a diagnosis of prostate cancer. He was initially diagnosed with Gleason 3+3 prostate cancer with a PSA of 5.44 on 10/16/2009 by Dr. Karsten Ro. Since that time, he has been on active surveillance. He has undergone several surveillance biopsies as his PSA continued to rise, but the cancer had remained stable and DRE has remained normal.   In 10/2019, his PSA rose to 30.2, prompting a prostate MRI which was performed on 12/06/19 and showed a PI-RADS 5 lesion in the left paramidline anterior apex with a prostate volume of 76 cc. He proceeded to MRI fusion biopsy on 12/29/2019. The prostate volume measured 73.62 cc by ultrasound.  Out of 16 core biopsies, 5 were positive.  The maximum Gleason score was 4+3, and this was seen in the right mid lateral. Additionally, Gleason 3+4 was seen in one of the ROI MRI lesion cores and Gleason 3+3 was seen in a second lesion core as well as the left apex lateral, and right base.  The patient reviewed the biopsy results with his urologist and he has kindly been referred today for discussion of potential radiation treatment options.   PREVIOUS RADIATION THERAPY: No  PAST MEDICAL HISTORY:  Past Medical History:  Diagnosis Date  . Chest pain   . Coronary artery disease   . Coronary syndrome, acute ([place]) 2006  . Hyperlipidemia   .  Hypertension   . Prostate cancer ([place])   . Syncope and collapse       PAST SURGICAL HISTORY: Past Surgical History:  Procedure Laterality Date  . CARDIAC CATHETERIZATION  10/13/2009   EF 65%  . CARDIAC CATHETERIZATION N/A 10/25/2016   Procedure: Left Heart Cath and Coronary Angiography;  Surgeon: Belva Crome, MD;  Location: [facility];  Service: Cardiovascular;  Laterality: N/A;  . CARDIAC CATHETERIZATION N/A 10/25/2016   Procedure: Coronary Stent Intervention;  Surgeon: Belva Crome, MD;  Location: [facility];  Service: Cardiovascular;  Laterality: N/A;  . CARDIAC CATHETERIZATION N/A 10/25/2016   Procedure: Intravascular Pressure Wire/FFR Study;  Surgeon: Belva Crome, MD;  Location: [facility];  Service: Cardiovascular;  Laterality: N/A;  . CARDIOVASCULAR STRESS TEST  09/29/2009   EF 70%  . CORONARY ANGIOPLASTY     STENTING OF HIS LAD, STENTING OF HIS RIGHT CORONARY.  Marland Kitchen CORONARY ARTERY BYPASS GRAFT N/A 07/03/2018   Procedure: Coronary artery bypass grafting times three using left internal mammary artery and right greater saphenous vein harvested endoscopically.;  Surgeon: Gaye Pollack, MD;  Location: [facility];  Service: Open Heart Surgery;  Laterality: N/A;  . INTRAVASCULAR PRESSURE WIRE/FFR STUDY N/A 06/08/2018   Procedure: INTRAVASCULAR PRESSURE WIRE/FFR STUDY;  Surgeon: Nelva Bush, MD;  Location: [facility];  Service: Cardiovascular;  Laterality: N/A;  . KNEE SURGERY Left   . LEFT  HEART CATH AND CORONARY ANGIOGRAPHY N/A 06/08/2018   Procedure: LEFT HEART CATH AND CORONARY ANGIOGRAPHY;  Surgeon: Nelva Bush, MD;  Location: [facility];  Service: Cardiovascular;  Laterality: N/A;  . PROSTATE BIOPSY    . TEE WITHOUT CARDIOVERSION N/A 07/03/2018   Procedure: TRANSESOPHAGEAL ECHOCARDIOGRAM (TEE);  Surgeon: Gaye Pollack, MD;  Location: [facility];  Service: Open Heart Surgery;  Laterality: N/A;  . US ECHOCARDIOGRAPHY  12/15/2009   EF 55-60%     FAMILY HISTORY:  Family History  Problem Relation Age of Onset  . Heart attack Mother   . Stroke Mother   . Breast cancer Mother   . Lung cancer Father        smoker  . Colon cancer Neg Hx   . Pancreatic cancer Neg Hx   . Prostate cancer Neg Hx     SOCIAL HISTORY:  Social History   Socioeconomic History  . Marital status: Married    Spouse name: Neoma Laming  . Number of children: 1  . Years of education: Not on file  . Highest education level: Not on file  Occupational History  . Not on file  Tobacco Use  . Smoking status: Never Smoker  . Smokeless tobacco: Never Used  Substance and Sexual Activity  . Alcohol use: No  . Drug use: No  . Sexual activity: Yes  Other Topics Concern  . Not on file  Social History Narrative  . Not on file   Social Determinants of Health   Financial Resource Strain:   . Difficulty of Paying Living Expenses:   Food Insecurity:   . Worried About Charity fundraiser in the Last Year:   . Arboriculturist in the Last Year:   Transportation Needs:   . Film/video editor (Medical):   Marland Kitchen Lack of Transportation (Non-Medical):   Physical Activity:   . Days of Exercise per Week:   . Minutes of Exercise per Session:   Stress:   . Feeling of Stress :   Social Connections:   . Frequency of Communication with Friends and Family:   . Frequency of Social Gatherings with Friends and Family:   . Attends Religious Services:   . Active Member of Clubs or Organizations:   . Attends Archivist Meetings:   Marland Kitchen Marital Status:   Intimate Partner Violence:   . Fear of Current or Ex-Partner:   . Emotionally Abused:   Marland Kitchen Physically Abused:   . Sexually Abused:     ALLERGIES: Patient has no known allergies.  MEDICATIONS:  Current Outpatient Medications  Medication Sig Dispense Refill  . acetaminophen (TYLENOL) 325 MG tablet Take 2 tablets (650 mg total) by mouth every 6 (six) hours as needed for mild pain.    Marland Kitchen aspirin EC 81 MG tablet  Take 81 mg by mouth daily.    Marland Kitchen atorvastatin (LIPITOR) 80 MG tablet TAKE 1 TABLET(80 MG) BY MOUTH DAILY AT 6 PM 90 tablet 2  . lisinopril (ZESTRIL) 2.5 MG tablet TAKE 1 TABLET BY MOUTH EVERY DAY 90 tablet 2  . metoprolol tartrate (LOPRESSOR) 25 MG tablet TAKE 1/2 TABLET(12.5 MG) BY MOUTH TWICE DAILY 90 tablet 2  . Multiple Vitamin (MULTIVITAMIN WITH MINERALS) TABS tablet Take 1 tablet by mouth daily after supper.     . nitroGLYCERIN (NITROSTAT) 0.4 MG SL tablet Place 0.4 mg under the tongue as needed for chest pain.     No current facility-administered medications for this encounter.  REVIEW OF SYSTEMS:  On review of systems, the patient reports that he is doing well overall. He denies any chest pain, shortness of breath, cough, fevers, chills, night sweats, unintended weight changes. He denies any bowel disturbances, and denies abdominal pain, nausea or vomiting. He denies any new musculoskeletal or joint aches or pains. His IPSS was 12, indicating moderate urinary symptoms. He reports weak stream and post void dribble, which has been going on for the past few months but not particularly bothersome. His SHIM was 3 secondary to inactivity.  A complete review of systems is obtained and is otherwise negative.  PHYSICAL EXAM:  Wt Readings from Last 3 Encounters:  01/25/20 188 lb (85.3 kg)  05/25/19 189 lb (85.7 kg)  09/03/18 183 lb (83 kg)   Temp Readings from Last 3 Encounters:  07/28/18 98 F (36.7 C) (Oral)  07/07/18 98.2 F (36.8 C) (Oral)  07/01/18 97.8 F (36.6 C) (Oral)   BP Readings from Last 3 Encounters:  05/25/19 120/60  09/03/18 (!) 154/78  08/05/18 133/74   Pulse Readings from Last 3 Encounters:  05/25/19 76  09/03/18 68  08/05/18 76   Pain Assessment Pain Score: 0-No pain/10  Physical exam not performed in light of the patient's camera not working during this virtual consult.   KPS = 90  100 - Normal; no complaints; no evidence of disease. 90   - Able to  carry on normal activity; minor signs or symptoms of disease. 80   - Normal activity with effort; some signs or symptoms of disease. 77   - Cares for self; unable to carry on normal activity or to do active work. 60   - Requires occasional assistance, but is able to care for most of his personal needs. 50   - Requires considerable assistance and frequent medical care. 1   - Disabled; requires special care and assistance. 32   - Severely disabled; hospital admission is indicated although death not imminent. 59   - Very sick; hospital admission necessary; active supportive treatment necessary. 10   - Moribund; fatal processes progressing rapidly. 0     - Dead  Karnofsky DA, Abelmann [place], Craver LS and Burchenal [facility] [phone]) The use of the nitrogen mustards in the palliative treatment of carcinoma: with particular reference to bronchogenic carcinoma Cancer 1 [phone]  LABORATORY DATA:  Lab Results  Component Value Date   WBC 12.1 (H) 07/28/2018   HGB 14.3 07/28/2018   HCT 45.5 07/28/2018   MCV 93.2 07/28/2018   PLT 314 07/28/2018   Lab Results  Component Value Date   NA 139 07/28/2018   K 4.8 07/28/2018   CL 105 07/28/2018   CO2 25 07/28/2018   Lab Results  Component Value Date   ALT 22 07/01/2018   AST 30 07/01/2018   ALKPHOS 44 07/01/2018   BILITOT 1.4 (H) 07/01/2018     RADIOGRAPHY: No results found.    IMPRESSION/PLAN: This visit was conducted via MyChart to spare the patient unnecessary potential exposure in the healthcare setting during the current COVID-19 pandemic. 1. 79 y.o. gentleman with Stage T1c adenocarcinoma of the prostate with Gleason Score of 4+3, and PSA of 30.2. We discussed the patient's workup and outlined the nature of prostate cancer in this setting. The patient's T stage, Gleason's score, and PSA put him into the intermediate risk group.  We discussed the concern with the PSA above 20 which technically places him into the high risk group, however, it is felt  that the significant BPH is the most likely source of the PSA with a prostate volume of 74 gm.  We did discuss the potential role of disease staging with CT A/P and Bone scan to further evaluation and r/o evidence of more advanced disease but advised that we would leave this decision to the discretion of Dr. Karsten Ro.  Currently, we feel that he is eligible for a variety of potential treatment options including brachytherapy, 5.5 weeks of external radiation, or prostatectomy. We discussed the available radiation techniques, and focused on the details and logistics of delivery. The patient is not an ideal candidate for brachytherapy with a prostate volume of 74 cc prior to downsizing from hormone therapy. We discussed the need for ST-ADT to downsize the prostate prior to brachytherapy if he were to choose to proceed with this treatment but he indicated that he would prefer external beam radiotherapy.  Therefore, we focused our discussion on prostate IMRT and outlined the risks, benefits, short and long-term effects associated with treatment and compared and contrasted these with prostatectomy. We discussed the role of SpaceOAR in reducing the rectal toxicity associated with radiotherapy.  The patient and his wife were encouraged to ask questions that were answered to their stated satisfaction.  At the end of the conversation, the patient is interested in moving forward with 5.5 weeks of external beam therapy. We will share our discussion with Dr. Karsten Ro to determine whether a CT and bone scan for disease staging is deemed to be appropriate and if so, will await those results to confirm the absence of advanced disease.  Once we have those results available, we will make arrangements for fiducial markers and SpaceOAR gel placement, prior to simulation, to reduce rectal toxicity from radiotherapy in anticipation of beginning his daily treatments in the near future. The patient appears to have a good understanding of  his disease and our treatment recommendations which are of curative intent and is in agreement with the stated plan.  He knows that he is welcome to call back at anytime with any further questions or concerns in the interim.  Given current concerns for patient exposure during the COVID-19 pandemic, this encounter was conducted via video-enabled MyChart visit, however the patient's camera stopped working prior to the visit. The patient has given verbal consent for this type of encounter. The time spent during this encounter was 60 minutes. The attendants for this meeting include Tyler Pita MD, Ashlyn Bruning PA-C, [place], and patient, Kurt Diaz and his wife. During the encounter, Tyler Pita MD, Ashlyn Bruning PA-C, and scribe, Wilburn Mylar were located at [place].  Patient, Kurt Diaz and his wife were located at home.    Nicholos Johns, PA-C    Tyler Pita, MD  [facility] Direct Dial: [phone]  Fax: [phone] [email or web address]  Skype  [email or web address]  This document serves as a record of services personally performed by Tyler Pita, MD and Freeman Caldron, PA-C. It was created on their behalf by Wilburn Mylar, a trained medical scribe. The creation of this record is based on the scribe's personal observations and the provider's statements to them. This document has been checked and approved by the attending provider.

## 2020-01-25 NOTE — Progress Notes (Signed)
GU Location of Tumor / Histology: prostatic adenocarcinoma  If Prostate Cancer, Gleason Score is (4 + 3) and PSA is (30.20). Prostate volume: 73.62 g  Kurt Diaz has been under active surveillance since January 2011 when he was originally dx with gleason 3+3 prostatic adenocarcinoma with a psa 5.44.     Biopsies of prostate (if applicable) revealed:   Past/Anticipated interventions by urology, if any: prostate biopsy, active surveillance, prostate biopsy, referral for consideration of radiation therapy  Past/Anticipated interventions by medical oncology, if any: no  Weight changes, if any: no  Bowel/Bladder complaints, if any: IPSS 12. SHIM 3 due to inactivity. Denies dysuria, hematuria or urinary leakage. Denies urinary incontinence. Reports a weaker stream and dribble at the end has been going on for several months now. Denies any bowel complaints.   Nausea/Vomiting, if any: no  Pain issues, if any:  denies  SAFETY ISSUES:  Prior radiation? denies  Pacemaker/ICD? denies  Possible current pregnancy? no, male patient  Is the patient on methotrexate? denies  Current Complaints / other details:  79 year old male. Married with one daughter. Never a smoker. Father with a hx of lung ca (smoker). Mother had a hx of breast cancer.

## 2020-01-26 ENCOUNTER — Other Ambulatory Visit (HOSPITAL_COMMUNITY): Payer: Self-pay | Admitting: Urology

## 2020-01-26 ENCOUNTER — Other Ambulatory Visit: Payer: Self-pay | Admitting: Urology

## 2020-01-26 DIAGNOSIS — C61 Malignant neoplasm of prostate: Secondary | ICD-10-CM

## 2020-01-26 NOTE — Progress Notes (Signed)
I will have my office contact the patient and will get a CT scan and bone scan and for those results to you.  Thanks.

## 2020-01-27 DIAGNOSIS — K219 Gastro-esophageal reflux disease without esophagitis: Secondary | ICD-10-CM | POA: Diagnosis not present

## 2020-01-27 DIAGNOSIS — Z86010 Personal history of colonic polyps: Secondary | ICD-10-CM | POA: Diagnosis not present

## 2020-02-01 ENCOUNTER — Encounter: Payer: Self-pay | Admitting: Medical Oncology

## 2020-02-01 DIAGNOSIS — C61 Malignant neoplasm of prostate: Secondary | ICD-10-CM | POA: Diagnosis not present

## 2020-02-01 NOTE — Progress Notes (Signed)
Left message to introduce myself as the prostate nurse navigator and discuss my role. I was unable to meet him 5/4, when he consulted with Dr. Tammi Klippel.I provided my contact information and asked him to call with questions or concerns. He is scheduled for CT abd/pelvis and bone scan on 5/17. I will continue to follow.

## 2020-02-07 ENCOUNTER — Encounter (HOSPITAL_COMMUNITY)
Admission: RE | Admit: 2020-02-07 | Discharge: 2020-02-07 | Disposition: A | Discharge status: Home / Self Care | Payer: Medicare Other | Source: Ambulatory Visit | Attending: Urology | Admitting: Urology

## 2020-02-07 ENCOUNTER — Other Ambulatory Visit: Payer: Self-pay

## 2020-02-07 DIAGNOSIS — C61 Malignant neoplasm of prostate: Secondary | ICD-10-CM | POA: Insufficient documentation

## 2020-02-07 MED ORDER — TECHNETIUM TC 99M MEDRONATE IV KIT
21.7000 | PACK | Freq: Once | INTRAVENOUS | Status: AC
  Administered 2020-02-07: 21.7 via INTRAVENOUS

## 2020-02-10 ENCOUNTER — Other Ambulatory Visit: Payer: Self-pay | Admitting: Urology

## 2020-02-10 ENCOUNTER — Encounter: Payer: Self-pay | Admitting: Urology

## 2020-02-10 ENCOUNTER — Encounter: Payer: Self-pay | Admitting: Medical Oncology

## 2020-02-10 DIAGNOSIS — C61 Malignant neoplasm of prostate: Secondary | ICD-10-CM

## 2020-02-10 NOTE — Progress Notes (Signed)
Patient had CT and bone scan on 02/07/20, both of which are negative for evidence of visceral or osseous metastases.  He is scheduled for fiducial markers and SpaceOAR with Dr. Karsten Ro on 6/18.  Nicholos Johns, MMS, PA-C [facility] [place]: [phone]  Fax: [phone]

## 2020-02-10 NOTE — Progress Notes (Signed)
Left message requesting a return call regarding CT/bone scan results and appointment for fiducial markers/SpaceOar.

## 2020-02-28 ENCOUNTER — Other Ambulatory Visit: Payer: Self-pay

## 2020-02-28 MED ORDER — LISINOPRIL 2.5 MG PO TABS: 2.5000 mg | ORAL_TABLET | Freq: Every day | ORAL | 0 refills | Status: DC

## 2020-02-28 MED ORDER — METOPROLOL TARTRATE 25 MG PO TABS: ORAL_TABLET | ORAL | 0 refills | Status: DC

## 2020-03-01 ENCOUNTER — Other Ambulatory Visit: Payer: Self-pay

## 2020-03-01 MED ORDER — ATORVASTATIN CALCIUM 80 MG PO TABS: ORAL_TABLET | ORAL | 0 refills | Status: DC

## 2020-03-13 ENCOUNTER — Ambulatory Visit (HOSPITAL_COMMUNITY): Payer: Medicare Other

## 2020-03-15 ENCOUNTER — Telehealth: Payer: Self-pay | Admitting: *Deleted

## 2020-03-15 NOTE — Telephone Encounter (Signed)
Called patient to remind of sim and MRI for 03-17-20, spoke with patient and he is aware of these appts.

## 2020-03-17 ENCOUNTER — Other Ambulatory Visit: Payer: Self-pay

## 2020-03-17 ENCOUNTER — Ambulatory Visit
Admission: RE | Admit: 2020-03-17 | Discharge: 2020-03-17 | Disposition: A | Discharge status: Home / Self Care | Payer: Medicare Other | Source: Ambulatory Visit | Attending: Radiation Oncology | Admitting: Radiation Oncology

## 2020-03-17 ENCOUNTER — Ambulatory Visit (HOSPITAL_COMMUNITY)
Admission: RE | Admit: 2020-03-17 | Discharge: 2020-03-17 | Disposition: A | Discharge status: Home / Self Care | Payer: Medicare Other | Source: Ambulatory Visit | Attending: Urology | Admitting: Urology

## 2020-03-17 ENCOUNTER — Encounter: Payer: Self-pay | Admitting: Medical Oncology

## 2020-03-17 DIAGNOSIS — C61 Malignant neoplasm of prostate: Secondary | ICD-10-CM | POA: Diagnosis not present

## 2020-03-19 NOTE — Progress Notes (Signed)
  Radiation Oncology         [phone] ________________________________  Name: Kurt Diaz MRN: [id number]  Date: 03/17/2020  DOB: December 08, 1940  SIMULATION AND TREATMENT PLANNING NOTE    ICD-10-CM   1. Prostate cancer [facility])  C61     DIAGNOSIS:  79 y.o. gentleman with Stage T1c adenocarcinoma of the prostate with Gleason score of 4+3, and PSA of 30.2  NARRATIVE:  The patient was brought to the [facility].  Identity was confirmed.  All relevant records and images related to the planned course of therapy were reviewed.  The patient freely provided informed written consent to proceed with treatment after reviewing the details related to the planned course of therapy. The consent form was witnessed and verified by the simulation staff.  Then, the patient was set-up in a stable reproducible supine position for radiation therapy.  A vacuum lock pillow device was custom fabricated to position his legs in a reproducible immobilized position.  Then, I performed a urethrogram under sterile conditions to identify the prostatic apex.  CT images were obtained.  Surface markings were placed.  The CT images were loaded into the planning software.  Then the prostate target and avoidance structures including the rectum, bladder, bowel and hips were contoured.  Treatment planning then occurred.  The radiation prescription was entered and confirmed.  A total of one complex treatment devices was fabricated. I have requested : Intensity Modulated Radiotherapy (IMRT) is medically necessary for this case for the following reason:  Rectal sparing.Marland Kitchen  PLAN:  The patient will receive 70 Gy in 28 fractions.  ________________________________  [name], M.D.

## 2020-03-28 DIAGNOSIS — C61 Malignant neoplasm of prostate: Secondary | ICD-10-CM | POA: Diagnosis not present

## 2020-04-02 ENCOUNTER — Ambulatory Visit: Admission: RE | Admit: 2020-04-02 | Payer: Medicare Other | Source: Ambulatory Visit

## 2020-04-03 ENCOUNTER — Encounter: Payer: Self-pay | Admitting: Medical Oncology

## 2020-04-03 ENCOUNTER — Ambulatory Visit
Admission: RE | Admit: 2020-04-03 | Discharge: 2020-04-03 | Disposition: A | Discharge status: Home / Self Care | Payer: Medicare Other | Source: Ambulatory Visit | Attending: Radiation Oncology | Admitting: Radiation Oncology

## 2020-04-03 DIAGNOSIS — C61 Malignant neoplasm of prostate: Secondary | ICD-10-CM | POA: Diagnosis not present

## 2020-04-04 ENCOUNTER — Other Ambulatory Visit: Payer: Self-pay

## 2020-04-04 ENCOUNTER — Ambulatory Visit
Admission: RE | Admit: 2020-04-04 | Discharge: 2020-04-04 | Disposition: A | Discharge status: Home / Self Care | Payer: Medicare Other | Source: Ambulatory Visit | Attending: Radiation Oncology | Admitting: Radiation Oncology

## 2020-04-04 DIAGNOSIS — C61 Malignant neoplasm of prostate: Secondary | ICD-10-CM | POA: Diagnosis not present

## 2020-04-05 ENCOUNTER — Other Ambulatory Visit: Payer: Self-pay

## 2020-04-05 ENCOUNTER — Ambulatory Visit
Admission: RE | Admit: 2020-04-05 | Discharge: 2020-04-05 | Disposition: A | Discharge status: Home / Self Care | Payer: Medicare Other | Source: Ambulatory Visit | Attending: Radiation Oncology | Admitting: Radiation Oncology

## 2020-04-05 DIAGNOSIS — C61 Malignant neoplasm of prostate: Secondary | ICD-10-CM | POA: Diagnosis not present

## 2020-04-06 ENCOUNTER — Other Ambulatory Visit: Payer: Self-pay

## 2020-04-06 ENCOUNTER — Ambulatory Visit
Admission: RE | Admit: 2020-04-06 | Discharge: 2020-04-06 | Disposition: A | Discharge status: Home / Self Care | Payer: Medicare Other | Source: Ambulatory Visit | Attending: Radiation Oncology | Admitting: Radiation Oncology

## 2020-04-06 DIAGNOSIS — C61 Malignant neoplasm of prostate: Secondary | ICD-10-CM | POA: Diagnosis not present

## 2020-04-07 ENCOUNTER — Ambulatory Visit
Admission: RE | Admit: 2020-04-07 | Discharge: 2020-04-07 | Disposition: A | Discharge status: Home / Self Care | Payer: Medicare Other | Source: Ambulatory Visit | Attending: Radiation Oncology | Admitting: Radiation Oncology

## 2020-04-07 ENCOUNTER — Other Ambulatory Visit: Payer: Self-pay

## 2020-04-07 DIAGNOSIS — C61 Malignant neoplasm of prostate: Secondary | ICD-10-CM | POA: Diagnosis not present

## 2020-04-10 ENCOUNTER — Ambulatory Visit: Payer: Medicare Other

## 2020-04-11 ENCOUNTER — Other Ambulatory Visit: Payer: Self-pay

## 2020-04-11 ENCOUNTER — Ambulatory Visit
Admission: RE | Admit: 2020-04-11 | Discharge: 2020-04-11 | Disposition: A | Discharge status: Home / Self Care | Payer: Medicare Other | Source: Ambulatory Visit | Attending: Radiation Oncology | Admitting: Radiation Oncology

## 2020-04-11 DIAGNOSIS — C61 Malignant neoplasm of prostate: Secondary | ICD-10-CM | POA: Diagnosis not present

## 2020-04-12 ENCOUNTER — Ambulatory Visit
Admission: RE | Admit: 2020-04-12 | Discharge: 2020-04-12 | Disposition: A | Discharge status: Home / Self Care | Payer: Medicare Other | Source: Ambulatory Visit | Attending: Radiation Oncology | Admitting: Radiation Oncology

## 2020-04-12 ENCOUNTER — Other Ambulatory Visit: Payer: Self-pay

## 2020-04-12 DIAGNOSIS — C61 Malignant neoplasm of prostate: Secondary | ICD-10-CM | POA: Diagnosis not present

## 2020-04-13 ENCOUNTER — Ambulatory Visit
Admission: RE | Admit: 2020-04-13 | Discharge: 2020-04-13 | Disposition: A | Discharge status: Home / Self Care | Payer: Medicare Other | Source: Ambulatory Visit | Attending: Radiation Oncology | Admitting: Radiation Oncology

## 2020-04-13 ENCOUNTER — Other Ambulatory Visit: Payer: Self-pay

## 2020-04-13 DIAGNOSIS — C61 Malignant neoplasm of prostate: Secondary | ICD-10-CM | POA: Diagnosis not present

## 2020-04-14 ENCOUNTER — Other Ambulatory Visit: Payer: Self-pay

## 2020-04-14 ENCOUNTER — Ambulatory Visit
Admission: RE | Admit: 2020-04-14 | Discharge: 2020-04-14 | Disposition: A | Discharge status: Home / Self Care | Payer: Medicare Other | Source: Ambulatory Visit | Attending: Radiation Oncology | Admitting: Radiation Oncology

## 2020-04-14 DIAGNOSIS — C61 Malignant neoplasm of prostate: Secondary | ICD-10-CM | POA: Diagnosis not present

## 2020-04-17 ENCOUNTER — Other Ambulatory Visit: Payer: Self-pay

## 2020-04-17 ENCOUNTER — Ambulatory Visit
Admission: RE | Admit: 2020-04-17 | Discharge: 2020-04-17 | Disposition: A | Discharge status: Home / Self Care | Payer: Medicare Other | Source: Ambulatory Visit | Attending: Radiation Oncology | Admitting: Radiation Oncology

## 2020-04-17 DIAGNOSIS — C61 Malignant neoplasm of prostate: Secondary | ICD-10-CM | POA: Diagnosis not present

## 2020-04-18 ENCOUNTER — Ambulatory Visit
Admission: RE | Admit: 2020-04-18 | Discharge: 2020-04-18 | Disposition: A | Discharge status: Home / Self Care | Payer: Medicare Other | Source: Ambulatory Visit | Attending: Radiation Oncology | Admitting: Radiation Oncology

## 2020-04-18 ENCOUNTER — Other Ambulatory Visit: Payer: Self-pay

## 2020-04-18 DIAGNOSIS — C61 Malignant neoplasm of prostate: Secondary | ICD-10-CM | POA: Diagnosis not present

## 2020-04-19 ENCOUNTER — Ambulatory Visit
Admission: RE | Admit: 2020-04-19 | Discharge: 2020-04-19 | Disposition: A | Discharge status: Home / Self Care | Payer: Medicare Other | Source: Ambulatory Visit | Attending: Radiation Oncology | Admitting: Radiation Oncology

## 2020-04-19 ENCOUNTER — Other Ambulatory Visit: Payer: Self-pay

## 2020-04-19 DIAGNOSIS — C61 Malignant neoplasm of prostate: Secondary | ICD-10-CM | POA: Diagnosis not present

## 2020-04-20 ENCOUNTER — Ambulatory Visit
Admission: RE | Admit: 2020-04-20 | Discharge: 2020-04-20 | Disposition: A | Discharge status: Home / Self Care | Payer: Medicare Other | Source: Ambulatory Visit | Attending: Radiation Oncology | Admitting: Radiation Oncology

## 2020-04-20 ENCOUNTER — Other Ambulatory Visit: Payer: Self-pay

## 2020-04-20 DIAGNOSIS — C61 Malignant neoplasm of prostate: Secondary | ICD-10-CM | POA: Diagnosis not present

## 2020-04-21 ENCOUNTER — Ambulatory Visit
Admission: RE | Admit: 2020-04-21 | Discharge: 2020-04-21 | Disposition: A | Discharge status: Home / Self Care | Payer: Medicare Other | Source: Ambulatory Visit | Attending: Radiation Oncology | Admitting: Radiation Oncology

## 2020-04-21 DIAGNOSIS — C61 Malignant neoplasm of prostate: Secondary | ICD-10-CM | POA: Diagnosis not present

## 2020-04-24 ENCOUNTER — Other Ambulatory Visit: Payer: Self-pay

## 2020-04-24 ENCOUNTER — Ambulatory Visit
Admission: RE | Admit: 2020-04-24 | Discharge: 2020-04-24 | Disposition: A | Discharge status: Home / Self Care | Payer: Medicare Other | Source: Ambulatory Visit | Attending: Radiation Oncology | Admitting: Radiation Oncology

## 2020-04-24 DIAGNOSIS — C61 Malignant neoplasm of prostate: Secondary | ICD-10-CM | POA: Diagnosis not present

## 2020-04-25 ENCOUNTER — Ambulatory Visit
Admission: RE | Admit: 2020-04-25 | Discharge: 2020-04-25 | Disposition: A | Discharge status: Home / Self Care | Payer: Medicare Other | Source: Ambulatory Visit | Attending: Radiation Oncology | Admitting: Radiation Oncology

## 2020-04-25 ENCOUNTER — Other Ambulatory Visit: Payer: Self-pay

## 2020-04-25 DIAGNOSIS — C61 Malignant neoplasm of prostate: Secondary | ICD-10-CM | POA: Diagnosis not present

## 2020-04-26 ENCOUNTER — Ambulatory Visit
Admission: RE | Admit: 2020-04-26 | Discharge: 2020-04-26 | Disposition: A | Discharge status: Home / Self Care | Payer: Medicare Other | Source: Ambulatory Visit | Attending: Radiation Oncology | Admitting: Radiation Oncology

## 2020-04-26 ENCOUNTER — Other Ambulatory Visit: Payer: Self-pay

## 2020-04-26 DIAGNOSIS — C61 Malignant neoplasm of prostate: Secondary | ICD-10-CM | POA: Diagnosis not present

## 2020-04-27 ENCOUNTER — Other Ambulatory Visit: Payer: Self-pay

## 2020-04-27 ENCOUNTER — Ambulatory Visit
Admission: RE | Admit: 2020-04-27 | Discharge: 2020-04-27 | Disposition: A | Discharge status: Home / Self Care | Payer: Medicare Other | Source: Ambulatory Visit | Attending: Radiation Oncology | Admitting: Radiation Oncology

## 2020-04-27 DIAGNOSIS — C61 Malignant neoplasm of prostate: Secondary | ICD-10-CM | POA: Diagnosis not present

## 2020-04-28 ENCOUNTER — Other Ambulatory Visit: Payer: Self-pay

## 2020-04-28 ENCOUNTER — Ambulatory Visit
Admission: RE | Admit: 2020-04-28 | Discharge: 2020-04-28 | Disposition: A | Discharge status: Home / Self Care | Payer: Medicare Other | Source: Ambulatory Visit | Attending: Radiation Oncology | Admitting: Radiation Oncology

## 2020-04-28 DIAGNOSIS — C61 Malignant neoplasm of prostate: Secondary | ICD-10-CM | POA: Diagnosis not present

## 2020-05-01 ENCOUNTER — Other Ambulatory Visit: Payer: Self-pay

## 2020-05-01 ENCOUNTER — Ambulatory Visit
Admission: RE | Admit: 2020-05-01 | Discharge: 2020-05-01 | Disposition: A | Discharge status: Home / Self Care | Payer: Medicare Other | Source: Ambulatory Visit | Attending: Radiation Oncology | Admitting: Radiation Oncology

## 2020-05-01 DIAGNOSIS — C61 Malignant neoplasm of prostate: Secondary | ICD-10-CM | POA: Diagnosis not present

## 2020-05-02 ENCOUNTER — Other Ambulatory Visit: Payer: Self-pay

## 2020-05-02 ENCOUNTER — Ambulatory Visit
Admission: RE | Admit: 2020-05-02 | Discharge: 2020-05-02 | Disposition: A | Discharge status: Home / Self Care | Payer: Medicare Other | Source: Ambulatory Visit | Attending: Radiation Oncology | Admitting: Radiation Oncology

## 2020-05-02 DIAGNOSIS — C61 Malignant neoplasm of prostate: Secondary | ICD-10-CM | POA: Diagnosis not present

## 2020-05-02 DIAGNOSIS — M25522 Pain in left elbow: Secondary | ICD-10-CM | POA: Diagnosis not present

## 2020-05-03 ENCOUNTER — Ambulatory Visit
Admission: RE | Admit: 2020-05-03 | Discharge: 2020-05-03 | Disposition: A | Discharge status: Home / Self Care | Payer: Medicare Other | Source: Ambulatory Visit | Attending: Radiation Oncology | Admitting: Radiation Oncology

## 2020-05-03 ENCOUNTER — Other Ambulatory Visit: Payer: Self-pay

## 2020-05-03 DIAGNOSIS — C61 Malignant neoplasm of prostate: Secondary | ICD-10-CM | POA: Diagnosis not present

## 2020-05-04 ENCOUNTER — Other Ambulatory Visit: Payer: Self-pay

## 2020-05-04 ENCOUNTER — Ambulatory Visit
Admission: RE | Admit: 2020-05-04 | Discharge: 2020-05-04 | Disposition: A | Discharge status: Home / Self Care | Payer: Medicare Other | Source: Ambulatory Visit | Attending: Radiation Oncology | Admitting: Radiation Oncology

## 2020-05-04 DIAGNOSIS — C61 Malignant neoplasm of prostate: Secondary | ICD-10-CM | POA: Diagnosis not present

## 2020-05-05 ENCOUNTER — Ambulatory Visit
Admission: RE | Admit: 2020-05-05 | Discharge: 2020-05-05 | Disposition: A | Discharge status: Home / Self Care | Payer: Medicare Other | Source: Ambulatory Visit | Attending: Radiation Oncology | Admitting: Radiation Oncology

## 2020-05-05 ENCOUNTER — Other Ambulatory Visit: Payer: Self-pay

## 2020-05-05 DIAGNOSIS — C61 Malignant neoplasm of prostate: Secondary | ICD-10-CM | POA: Diagnosis not present

## 2020-05-08 ENCOUNTER — Other Ambulatory Visit: Payer: Self-pay

## 2020-05-08 ENCOUNTER — Ambulatory Visit
Admission: RE | Admit: 2020-05-08 | Discharge: 2020-05-08 | Disposition: A | Discharge status: Home / Self Care | Payer: Medicare Other | Source: Ambulatory Visit | Attending: Radiation Oncology | Admitting: Radiation Oncology

## 2020-05-08 DIAGNOSIS — C61 Malignant neoplasm of prostate: Secondary | ICD-10-CM | POA: Diagnosis not present

## 2020-05-09 ENCOUNTER — Other Ambulatory Visit: Payer: Self-pay

## 2020-05-09 ENCOUNTER — Ambulatory Visit
Admission: RE | Admit: 2020-05-09 | Discharge: 2020-05-09 | Disposition: A | Discharge status: Home / Self Care | Payer: Medicare Other | Source: Ambulatory Visit | Attending: Radiation Oncology | Admitting: Radiation Oncology

## 2020-05-09 DIAGNOSIS — C61 Malignant neoplasm of prostate: Secondary | ICD-10-CM | POA: Diagnosis not present

## 2020-05-10 ENCOUNTER — Ambulatory Visit: Payer: Medicare Other

## 2020-05-10 ENCOUNTER — Other Ambulatory Visit: Payer: Self-pay

## 2020-05-10 ENCOUNTER — Ambulatory Visit
Admission: RE | Admit: 2020-05-10 | Discharge: 2020-05-10 | Disposition: A | Discharge status: Home / Self Care | Payer: Medicare Other | Source: Ambulatory Visit | Attending: Radiation Oncology | Admitting: Radiation Oncology

## 2020-05-10 ENCOUNTER — Other Ambulatory Visit: Payer: Self-pay | Admitting: Cardiovascular Disease

## 2020-05-10 DIAGNOSIS — C61 Malignant neoplasm of prostate: Secondary | ICD-10-CM | POA: Diagnosis not present

## 2020-05-11 ENCOUNTER — Ambulatory Visit
Admission: RE | Admit: 2020-05-11 | Discharge: 2020-05-11 | Disposition: A | Discharge status: Home / Self Care | Payer: Medicare Other | Source: Ambulatory Visit | Attending: Radiation Oncology | Admitting: Radiation Oncology

## 2020-05-11 ENCOUNTER — Encounter: Payer: Self-pay | Admitting: Urology

## 2020-05-11 ENCOUNTER — Encounter: Payer: Self-pay | Admitting: Medical Oncology

## 2020-05-11 ENCOUNTER — Other Ambulatory Visit: Payer: Self-pay

## 2020-05-11 DIAGNOSIS — C61 Malignant neoplasm of prostate: Secondary | ICD-10-CM | POA: Diagnosis not present

## 2020-05-11 NOTE — Progress Notes (Signed)
Received patient in the clinic following final radiation treatment of 70 Gy to  prostate (3 down in block of 5). Patient verbalizes that he is unable to stay to see Dr. Tammi Klippel for PUT today because he has another appointment to get to at 1030. Provided patient with one month telephone follow up appointment card. Encouraged patient to phone with needs. Patient exited clinic, in no distress, with a steady gait and denying addition needs.

## 2020-05-12 ENCOUNTER — Other Ambulatory Visit: Payer: Self-pay | Admitting: Cardiovascular Disease

## 2020-06-06 NOTE — Progress Notes (Signed)
Date:  06/20/2020   ID:  Kurt Diaz, DOB 29-Apr-1941, MRN [id number]  Provider Location: Office  PCP:  Antony Contras, MD  Cardiologist:   [name] Electrophysiologist:  None   Evaluation Performed:  Follow-Up Visit  Chief Complaint:   CAD/CABG  History of Present Illness:    Kurt Diaz is a 79 y.o. male who presents for f/u CAD/CABG  MI December 23, 2004 sudden death airport [place]. Stenting LAD and RCA. 10/25/16 new stents to proximal and distal RCA. EF 45-50% More angina October 2019 had CABG 07/03/18 with LIMA to LAD, SVG to D1, and SVG to RCA.  Seen back in ER 07/28/18 For chest pain. Trivial effusion , R/O CXR ok. D/c home   BP good at home  Some white coat component   [place] a lot [place] originally from [place]. 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 Fell and cracked left patella 12-24-18 [facility] Followed by [facility]   Being Rx for prostate cancer PSA 30 February 2021 and XRT started  Sees Dr Karsten Ro urology Will see Dr Louis Meckel this Thursday as new MD  No cardiac issues   The patient  does not have symptoms concerning for COVID-19 infection (fever, chills, cough, or new shortness of breath).    Past Medical History:  Diagnosis Date  . Chest pain   . Coronary artery disease   . Coronary syndrome, acute ([place]) December 23, 2004  . Hyperlipidemia   . Hypertension   . Prostate cancer ([place])   . Syncope and collapse    Past Surgical History:  Procedure Laterality Date  . CARDIAC CATHETERIZATION  10/13/2009   EF 65%  . CARDIAC CATHETERIZATION N/A 10/25/2016   Procedure: Left Heart Cath and Coronary Angiography;  Surgeon: Belva Crome, MD;  Location: [facility];  Service: Cardiovascular;  Laterality: N/A;  . CARDIAC CATHETERIZATION N/A 10/25/2016   Procedure: Coronary Stent Intervention;  Surgeon: Belva Crome, MD;  Location: [facility];  Service: Cardiovascular;  Laterality: N/A;  . CARDIAC  CATHETERIZATION N/A 10/25/2016   Procedure: Intravascular Pressure Wire/FFR Study;  Surgeon: Belva Crome, MD;  Location: [facility];  Service: Cardiovascular;  Laterality: N/A;  . CARDIOVASCULAR STRESS TEST  09/29/2009   EF 70%  . CORONARY ANGIOPLASTY     STENTING OF HIS LAD, STENTING OF HIS RIGHT CORONARY.  Marland Kitchen CORONARY ARTERY BYPASS GRAFT N/A 07/03/2018   Procedure: Coronary artery bypass grafting times three using left internal mammary artery and right greater saphenous vein harvested endoscopically.;  Surgeon: Gaye Pollack, MD;  [facility];  Service: Open Heart Surgery;  Laterality: N/A;  . INTRAVASCULAR PRESSURE WIRE/FFR STUDY N/A 06/08/2018   Procedure: INTRAVASCULAR PRESSURE WIRE/FFR STUDY;  Surgeon: Nelva Bush, MD;  Location: [facility];  Service: Cardiovascular;  Laterality: N/A;  . KNEE SURGERY Left   . LEFT HEART CATH AND CORONARY ANGIOGRAPHY N/A 06/08/2018   Procedure: LEFT HEART CATH AND CORONARY ANGIOGRAPHY;  Surgeon: Nelva Bush, MD;  Location: [facility];  Service: Cardiovascular;  Laterality: N/A;  . PROSTATE BIOPSY    . TEE WITHOUT CARDIOVERSION N/A 07/03/2018   Procedure: TRANSESOPHAGEAL ECHOCARDIOGRAM (TEE);  Surgeon: Gaye Pollack, MD;  Location: [facility];  Service: Open Heart Surgery;  Laterality: N/A;  . US ECHOCARDIOGRAPHY  12/15/2009   EF 55-60%     Current Meds  Medication Sig  . acetaminophen (TYLENOL) 325 MG tablet Take  2 tablets (650 mg total) by mouth every 6 (six) hours as needed for mild pain.  Marland Kitchen aspirin EC 81 MG tablet Take 81 mg by mouth daily.  Marland Kitchen atorvastatin (LIPITOR) 80 MG tablet TAKE 1 TABLET DAILY AT 6 P.M. (PLEASE MAKE YEARLY APPOINTMENT WITH DR. [name] FOR SEPTEMBER BEFORE ANYMORE REFILLS [phone])  . lisinopril (ZESTRIL) 2.5 MG tablet TAKE 1 TABLET DAILY  . metoprolol tartrate (LOPRESSOR) 25 MG tablet TAKE ONE-HALF (1/2) TABLET TWICE A DAY  . Multiple Vitamin (MULTIVITAMIN WITH MINERALS) TABS tablet Take 1  tablet by mouth daily after supper.   . nitroGLYCERIN (NITROSTAT) 0.4 MG SL tablet Place 0.4 mg under the tongue as needed for chest pain.     Allergies:   Patient has no known allergies.   Social History   Tobacco Use  . Smoking status: Never Smoker  . Smokeless tobacco: Never Used  Vaping Use  . Vaping Use: Never used  Substance Use Topics  . Alcohol use: No  . Drug use: No     Family Hx: The patient's family history includes Breast cancer in his mother; Heart attack in his mother; Lung cancer in his father; Stroke in his mother. There is no history of Colon cancer, Pancreatic cancer, or Prostate cancer.  ROS:   Please see the history of present illness.     All other systems reviewed and are negative.   Prior CV studies:   The following studies were reviewed today:  Cath 06/11/18  Labs/Other Tests and Data Reviewed:    EKG:  SB rate 51 normal   Recent Labs: No results found for requested labs within last 8760 hours.   Recent Lipid Panel Lab Results  Component Value Date/Time   CHOL 99 (L) 12/16/2016 07:30 AM   TRIG 75 12/16/2016 07:30 AM   HDL 34 (L) 12/16/2016 07:30 AM   CHOLHDL 2.9 12/16/2016 07:30 AM   CHOLHDL 4 12/30/2014 07:41 AM   LDLCALC 50 12/16/2016 07:30 AM    Wt Readings from Last 3 Encounters:  06/20/20 188 lb 6.4 oz (85.5 kg)  05/11/20 193 lb (87.5 kg)  01/25/20 188 lb (85.3 kg)     Objective:    Vital Signs:  BP 132/72   Pulse (!) 51   Ht 5\' 8"  (1.727 m)   Wt 188 lb 6.4 oz (85.5 kg)   SpO2 98%   BMI 28.65 kg/m    Affect appropriate Healthy:  appears stated age HEENT: normal Neck supple with no adenopathy JVP normal no bruits no thyromegaly Lungs clear with no wheezing and good diaphragmatic motion Heart:  S1/S2 no murmur, no rub, gallop or click PMI normal Abdomen: benighn, BS positve, no tenderness, no AAA no bruit.  No HSM or HJR Distal pulses intact with no bruits No edema Neuro non-focal Skin warm and dry No muscular  weakness    ASSESSMENT & PLAN:    1. CAD with CABG X 3 07/03/18 . ASA 81 mg dose decreased  and statin   2.  Chest pain seen in ER with neg MI, trivial effusion bedside echo resolved observe   3.  HLD : continue high dose statin labs with primary   4.  HTN:  He will monitor at home if high we will increase lisinopril to 5 mg daily  5. Prostate Cancer:  F/U Karsten Ro and Tammi Klippel post XRT Rx follow PSA    F/U with me in a year   COVID-19 Education: The signs and symptoms of COVID-19 were discussed  with the patient and how to seek care for testing (follow up with PCP or arrange E-visit).  The importance of social distancing was discussed today.  Time:   Today, I have spent 30 minutes with the patient     Medication Adjustments/Labs and Tests Ordered: Current medicines are reviewed at length with the patient today.  Concerns regarding medicines are outlined above.   Tests Ordered:  None   Medication Changes:  None   Disposition:  Follow up in a year  Signed, Jenkins Rouge, MD  06/20/2020 10:25 AM    [place]

## 2020-06-12 DIAGNOSIS — I25119 Atherosclerotic heart disease of native coronary artery with unspecified angina pectoris: Secondary | ICD-10-CM | POA: Diagnosis not present

## 2020-06-12 DIAGNOSIS — K219 Gastro-esophageal reflux disease without esophagitis: Secondary | ICD-10-CM | POA: Diagnosis not present

## 2020-06-12 DIAGNOSIS — Z951 Presence of aortocoronary bypass graft: Secondary | ICD-10-CM | POA: Diagnosis not present

## 2020-06-12 DIAGNOSIS — E782 Mixed hyperlipidemia: Secondary | ICD-10-CM | POA: Diagnosis not present

## 2020-06-12 DIAGNOSIS — J309 Allergic rhinitis, unspecified: Secondary | ICD-10-CM | POA: Diagnosis not present

## 2020-06-12 DIAGNOSIS — I10 Essential (primary) hypertension: Secondary | ICD-10-CM | POA: Diagnosis not present

## 2020-06-12 DIAGNOSIS — C61 Malignant neoplasm of prostate: Secondary | ICD-10-CM | POA: Diagnosis not present

## 2020-06-14 ENCOUNTER — Ambulatory Visit: Payer: Self-pay | Admitting: Urology

## 2020-06-15 ENCOUNTER — Other Ambulatory Visit: Payer: Self-pay | Admitting: Cardiovascular Disease

## 2020-06-20 ENCOUNTER — Other Ambulatory Visit: Payer: Self-pay

## 2020-06-20 ENCOUNTER — Encounter: Payer: Self-pay | Admitting: Urology

## 2020-06-20 ENCOUNTER — Encounter: Payer: Self-pay | Admitting: Cardiovascular Disease

## 2020-06-20 ENCOUNTER — Ambulatory Visit (INDEPENDENT_AMBULATORY_CARE_PROVIDER_SITE_OTHER): Payer: Medicare Other | Admitting: Cardiovascular Disease

## 2020-06-20 VITALS — BP 132/72 | HR 51 | Ht 68.0 in | Wt 188.4 lb

## 2020-06-20 DIAGNOSIS — I2510 Atherosclerotic heart disease of native coronary artery without angina pectoris: Secondary | ICD-10-CM

## 2020-06-20 DIAGNOSIS — Z951 Presence of aortocoronary bypass graft: Secondary | ICD-10-CM

## 2020-06-20 NOTE — Progress Notes (Signed)
  Radiation Oncology         [phone]) [phone] ________________________________  Name: Kurt Diaz MRN: [id number]  Date: 05/11/2020  DOB: 1941-02-20  End of Treatment Note  Diagnosis:   79 y.o. gentleman with Stage T1c adenocarcinoma of the prostate with Gleason score of 4+3, and PSA of 30.2     Indication for treatment:  Curative, Definitive Radiotherapy       Radiation treatment dates:   04/03/20 - 05/11/20  Site/dose:   The prostate was treated to 70 Gy in 28 fractions of 2.5 Gy  Beams/energy:   The patient was treated with IMRT using volumetric arc therapy delivering 6 MV X-rays to clockwise and counterclockwise circumferential arcs with a 90 degree collimator offset to avoid dose scalloping.  Image guidance was performed with daily cone beam CT prior to each fraction to align to gold markers in the prostate and assure proper bladder and rectal fill volumes.  Immobilization was achieved with BodyFix custom mold.  Narrative: The patient tolerated radiation treatment relatively well with only minor urinary irritation and modest fatigue.  He reported increased frequency, urgency and nocturia x3/night. He also noted mild dysuria which improved near the end of treatment. Constipation was mild and manageable.  Plan: The patient has completed radiation treatment. He will return to radiation oncology clinic for routine followup in one month. I advised him to call or return sooner if he has any questions or concerns related to his recovery or treatment. ________________________________  Sheral Apley. Tammi Klippel, M.D.

## 2020-06-20 NOTE — Patient Instructions (Signed)

## 2020-06-20 NOTE — Progress Notes (Signed)
Radiation Oncology         [phone]) [phone] ________________________________  Name: KASHEEM TONER MRN: [id number]  Date: 06/21/2020  DOB: [birth year]  Post Treatment Note  CC: Antony Contras, MD  Kathie Rhodes, MD  Diagnosis:   79 y.o. gentleman with Stage T1c adenocarcinoma of the prostate with Gleason score of 4+3, and PSA of 30.2    Interval Since Last Radiation:  5.5 weeks  04/03/20 - 05/11/20:  The prostate was treated to 70 Gy in 28 fractions of 2.5 Gy  Narrative:  I spoke with the patient to conduct his routine scheduled 1 month follow up visit via telephone to spare the patient unnecessary potential exposure in the healthcare setting during the current COVID-19 pandemic.  The patient was notified in advance and gave permission to proceed with this visit format.   He tolerated radiation treatment relatively well with only minor urinary irritation and modest fatigue.  He reported increased frequency, urgency and nocturia x3/night. He also noted mild dysuria which improved near the end of treatment. Constipation was mild and manageable.                              On review of systems, the patient states that he is doing very well in general.  He reports that his LUTS are gradually improving, now with only mild increased frequency, urgency with occasional, small volume leakage if he postpones urination for too long and nocturia 1-2 times per night.  He reports complete resolution of dysuria and denies gross hematuria, straining to void, incomplete bladder emptying or incontinence.  He reports a healthy appetite and is maintaining his weight.  He denies abdominal pain, nausea, vomiting, diarrhea or constipation.  He has not noted any significant fatigue or change in his stamina.  Overall, he is quite pleased with his progress to date.  ALLERGIES:  has No Known Allergies.  Meds: Current Outpatient Medications  Medication Sig Dispense Refill  . acetaminophen (TYLENOL) 325 MG tablet Take 2  tablets (650 mg total) by mouth every 6 (six) hours as needed for mild pain.    Marland Kitchen aspirin EC 81 MG tablet Take 81 mg by mouth daily.    Marland Kitchen atorvastatin (LIPITOR) 80 MG tablet TAKE 1 TABLET DAILY AT 6 P.M. (PLEASE MAKE YEARLY APPOINTMENT WITH DR. [name] FOR SEPTEMBER BEFORE ANYMORE REFILLS [phone]) 90 tablet 3  . Diclofenac Sodium (PENNSAID) 2 % SOLN Pennsaid 20 mg/gram/actuation (2 %) topical soln in metered-dose pump  APPLY 2 PUMPS (40 MG) TO THE AFFECTED KNEE(S) BY TOPICAL ROUTE 2 TIMES PER DAY    . lisinopril (ZESTRIL) 2.5 MG tablet TAKE 1 TABLET DAILY 90 tablet 3  . metoprolol tartrate (LOPRESSOR) 25 MG tablet TAKE ONE-HALF (1/2) TABLET TWICE A DAY 90 tablet 0  . Multiple Vitamin (MULTIVITAMIN WITH MINERALS) TABS tablet Take 1 tablet by mouth daily after supper.     . nitroGLYCERIN (NITROSTAT) 0.4 MG SL tablet Place 0.4 mg under the tongue as needed for chest pain.     No current facility-administered medications for this encounter.    Physical Findings:  vitals were not taken for this visit.   /Unable to assess due to telephone follow up visit format.  Lab Findings: Lab Results  Component Value Date   WBC 12.1 (H) 07/28/2018   HGB 14.3 07/28/2018   HCT 45.5 07/28/2018   MCV 93.2 07/28/2018   PLT 314 07/28/2018     Radiographic Findings:  No results found.  Impression/Plan: 1. 79 y.o. gentleman with Stage T1c adenocarcinoma of the prostate with Gleason score of 4+3, and PSA of 30.2. He will continue to follow up with urology for ongoing PSA determinations and has an appointment scheduled with Dr. Milford Cage on 06/22/2020. He understands what to expect with regards to PSA monitoring going forward. I will look forward to following his response to treatment via correspondence with urology, and would be happy to continue to participate in his care if clinically indicated. I talked to the patient about what to expect in the future, including his risk for erectile dysfunction and rectal  bleeding. I encouraged him to call or return to the office if he has any questions regarding his previous radiation or possible radiation side effects. He was comfortable with this plan and will follow up as needed.    Nicholos Johns, PA-C

## 2020-06-21 ENCOUNTER — Ambulatory Visit
Admission: RE | Admit: 2020-06-21 | Discharge: 2020-06-21 | Disposition: A | Discharge status: Home / Self Care | Payer: Medicare Other | Source: Ambulatory Visit | Attending: Urology | Admitting: Urology

## 2020-06-21 DIAGNOSIS — C61 Malignant neoplasm of prostate: Secondary | ICD-10-CM

## 2020-06-22 DIAGNOSIS — C61 Malignant neoplasm of prostate: Secondary | ICD-10-CM | POA: Diagnosis not present

## 2020-08-08 ENCOUNTER — Other Ambulatory Visit: Payer: Self-pay | Admitting: Cardiovascular Disease

## 2020-08-22 DIAGNOSIS — Z23 Encounter for immunization: Secondary | ICD-10-CM | POA: Diagnosis not present

## 2020-10-24 DIAGNOSIS — R3915 Urgency of urination: Secondary | ICD-10-CM | POA: Diagnosis not present

## 2020-10-24 DIAGNOSIS — C61 Malignant neoplasm of prostate: Secondary | ICD-10-CM | POA: Diagnosis not present

## 2020-10-24 DIAGNOSIS — N5201 Erectile dysfunction due to arterial insufficiency: Secondary | ICD-10-CM | POA: Diagnosis not present

## 2020-12-22 ENCOUNTER — Other Ambulatory Visit: Payer: Self-pay

## 2020-12-22 ENCOUNTER — Encounter: Payer: Self-pay | Admitting: Cardiovascular Disease

## 2020-12-22 ENCOUNTER — Ambulatory Visit: Payer: Medicare Other | Admitting: Cardiovascular Disease

## 2020-12-22 VITALS — BP 118/56 | HR 64 | Ht 68.0 in | Wt 190.0 lb

## 2020-12-22 DIAGNOSIS — E782 Mixed hyperlipidemia: Secondary | ICD-10-CM

## 2020-12-22 DIAGNOSIS — Z951 Presence of aortocoronary bypass graft: Secondary | ICD-10-CM

## 2020-12-22 MED ORDER — NITROGLYCERIN 0.4 MG SL SUBL: 0.4000 mg | SUBLINGUAL_TABLET | SUBLINGUAL | 3 refills | Status: AC | PRN

## 2020-12-22 NOTE — Patient Instructions (Addendum)

## 2020-12-22 NOTE — Progress Notes (Signed)
Date:  12/22/2020   ID:  Kurt Diaz, DOB 1941/03/16, MRN [id number]  Provider Location: Office  PCP:  Kurt Contras, MD  Cardiologist:   Kurt Diaz Electrophysiologist:  None   Evaluation Performed:  Follow-Up Visit  Chief Complaint:   CAD/CABG  History of Present Illness:    Kurt Diaz is a 80 y.o. male who presents for f/u CAD/CABG  MI 12-18-04 sudden death airport [place]. Stenting LAD and RCA. 10/25/16 new stents to proximal and distal RCA. EF 45-50% More angina October 2019 had CABG 07/03/18 with LIMA to LAD, SVG to D1, and SVG to RCA.  Seen back in ER 07/28/18 For chest pain. Trivial effusion , R/O CXR ok. D/c home   BP good at home  Some white coat component   Travels a lot [place] originally from [place]. 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 Fell and cracked left patella December 19, 2018 [facility] Followed by [facility]   Being Rx for prostate cancer PSA 30 February 2021 and XRT started  Sees Dr Karsten Ro urology Will see Dr Louis Meckel this Thursday as new MD  No cardiac issues   The patient  does not have symptoms concerning for COVID-19 infection (fever, chills, cough, or new shortness of breath).    Past Medical History:  Diagnosis Date  . Chest pain   . Coronary artery disease   . Coronary syndrome, acute ([place]) December 18, 2004  . Hyperlipidemia   . Hypertension   . Prostate cancer ([place])   . Syncope and collapse    Past Surgical History:  Procedure Laterality Date  . CARDIAC CATHETERIZATION  10/13/2009   EF 65%  . CARDIAC CATHETERIZATION N/A 10/25/2016   Procedure: Left Heart Cath and Coronary Angiography;  Surgeon: Belva Crome, MD;  Location: [facility];  Service: Cardiovascular;  Laterality: N/A;  . CARDIAC CATHETERIZATION N/A 10/25/2016   Procedure: Coronary Stent Intervention;  Surgeon: Belva Crome, MD;  Location: [facility];  Service: Cardiovascular;  Laterality: N/A;  . CARDIAC  CATHETERIZATION N/A 10/25/2016   Procedure: Intravascular Pressure Wire/FFR Study;  Surgeon: Belva Crome, MD;  Location: [facility];  Service: Cardiovascular;  Laterality: N/A;  . CARDIOVASCULAR STRESS TEST  09/29/2009   EF 70%  . CORONARY ANGIOPLASTY     STENTING OF HIS LAD, STENTING OF HIS RIGHT CORONARY.  Marland Kitchen CORONARY ARTERY BYPASS GRAFT N/A 07/03/2018   Procedure: Coronary artery bypass grafting times three using left internal mammary artery and right greater saphenous vein harvested endoscopically.;  Surgeon: Kurt Pollack, MD;  [facility];  Service: Open Heart Surgery;  Laterality: N/A;  . INTRAVASCULAR PRESSURE WIRE/FFR STUDY N/A 06/08/2018   Procedure: INTRAVASCULAR PRESSURE WIRE/FFR STUDY;  Surgeon: Nelva Bush, MD;  Location: [facility];  Service: Cardiovascular;  Laterality: N/A;  . KNEE SURGERY Left   . LEFT HEART CATH AND CORONARY ANGIOGRAPHY N/A 06/08/2018   Procedure: LEFT HEART CATH AND CORONARY ANGIOGRAPHY;  Surgeon: Nelva Bush, MD;  Location: [facility];  Service: Cardiovascular;  Laterality: N/A;  . PROSTATE BIOPSY    . TEE WITHOUT CARDIOVERSION N/A 07/03/2018   Procedure: TRANSESOPHAGEAL ECHOCARDIOGRAM (TEE);  Surgeon: Kurt Pollack, MD;  Location: [facility];  Service: Open Heart Surgery;  Laterality: N/A;  . US ECHOCARDIOGRAPHY  12/15/2009   EF 55-60%     Current Meds  Medication Sig  . acetaminophen (TYLENOL) 325 MG tablet Take  2 tablets (650 mg total) by mouth every 6 (six) hours as needed for mild pain.  Marland Kitchen aspirin EC 81 MG tablet Take 81 mg by mouth daily.  Marland Kitchen atorvastatin (LIPITOR) 80 MG tablet TAKE 1 TABLET DAILY AT 6 P.M. (PLEASE MAKE YEARLY APPOINTMENT WITH DR. Johnsie Diaz FOR SEPTEMBER BEFORE ANYMORE REFILLS [phone])  . Diclofenac Sodium (PENNSAID) 2 % SOLN Pennsaid 20 mg/gram/actuation (2 %) topical soln in metered-dose pump  APPLY 2 PUMPS (40 MG) TO THE AFFECTED KNEE(S) BY TOPICAL ROUTE 2 TIMES PER DAY  . lisinopril (ZESTRIL)  2.5 MG tablet TAKE 1 TABLET DAILY  . metoprolol tartrate (LOPRESSOR) 25 MG tablet TAKE ONE-HALF (1/2) TABLET TWICE A DAY (PLEASE KEEP YOUR UPCOMING APPOINTMENT FOR ANY FUTURE REFILLS)  . Multiple Vitamin (MULTIVITAMIN WITH MINERALS) TABS tablet Take 1 tablet by mouth daily after supper.   . nitroGLYCERIN (NITROSTAT) 0.4 MG SL tablet Place 0.4 mg under the tongue as needed for chest pain.     Allergies:   Patient has no known allergies.   Social History   Tobacco Use  . Smoking status: Never Smoker  . Smokeless tobacco: Never Used  Vaping Use  . Vaping Use: Never used  Substance Use Topics  . Alcohol use: No  . Drug use: No     Family Hx: The patient's family history includes Breast cancer in his mother; Heart attack in his mother; Lung cancer in his father; Stroke in his mother. There is no history of Colon cancer, Pancreatic cancer, or Prostate cancer.  ROS:   Please see the history of present illness.     All other systems reviewed and are negative.   Prior CV studies:   The following studies were reviewed today:  Cath 06/11/18  Labs/Other Tests and Data Reviewed:    EKG:  SB rate 51 normal   Recent Labs: No results found for requested labs within last 8760 hours.   Recent Lipid Panel Lab Results  Component Value Date/Time   CHOL 99 (L) 12/16/2016 07:30 AM   TRIG 75 12/16/2016 07:30 AM   HDL 34 (L) 12/16/2016 07:30 AM   CHOLHDL 2.9 12/16/2016 07:30 AM   CHOLHDL 4 12/30/2014 07:41 AM   LDLCALC 50 12/16/2016 07:30 AM    Wt Readings from Last 3 Encounters:  12/22/20 86.2 kg  06/20/20 85.5 kg  05/11/20 87.5 kg     Objective:    Vital Signs:  BP (!) 118/56   Pulse 64   Ht 5\' 8"  (1.727 m)   Wt 86.2 kg   SpO2 98%   BMI 28.89 kg/m    Affect appropriate Healthy:  appears stated age 45: normal Neck supple with no adenopathy JVP normal no bruits no thyromegaly Lungs clear with no wheezing and good diaphragmatic motion Heart:  S1/S2 no murmur, no rub,  gallop or click PMI normal Abdomen: benighn, BS positve, no tenderness, no AAA no bruit.  No HSM or HJR Distal pulses intact with no bruits No edema Neuro non-focal Skin warm and dry No muscular weakness    ASSESSMENT & PLAN:    1. CAD with CABG X 3 07/03/18 . ASA 81 mg dose decreased  and statin Getting Labs with [facility] next week New nitro called in   2.  HLD : continue high dose statin labs with primary   3.  HTN:  He will monitor at home if high we will increase lisinopril to 5 mg daily  4. Prostate Cancer:  F/U Karsten Ro and Tammi Klippel post XRT  Rx follow PSA    F/U with me in a year   COVID-19 Education: The signs and symptoms of COVID-19 were discussed with the patient and how to seek care for testing (follow up with PCP or arrange E-visit).  The importance of social distancing was discussed today.  Time:   Today, I have spent 30 minutes with the patient     Medication Adjustments/Labs and Tests Ordered: Current medicines are reviewed at length with the patient today.  Concerns regarding medicines are outlined above.   Tests Ordered:  None   Medication Changes:  None   Disposition:  Follow up in a year  Signed, Jenkins Rouge, MD  12/22/2020 9:23 AM    [place]

## 2020-12-25 DIAGNOSIS — K219 Gastro-esophageal reflux disease without esophagitis: Secondary | ICD-10-CM | POA: Diagnosis not present

## 2020-12-25 DIAGNOSIS — I10 Essential (primary) hypertension: Secondary | ICD-10-CM | POA: Diagnosis not present

## 2020-12-25 DIAGNOSIS — E782 Mixed hyperlipidemia: Secondary | ICD-10-CM | POA: Diagnosis not present

## 2020-12-25 DIAGNOSIS — Z0000 Encounter for general adult medical examination without abnormal findings: Secondary | ICD-10-CM | POA: Diagnosis not present

## 2021-01-10 DIAGNOSIS — Z570 Occupational exposure to noise: Secondary | ICD-10-CM | POA: Diagnosis not present

## 2021-01-10 DIAGNOSIS — H903 Sensorineural hearing loss, bilateral: Secondary | ICD-10-CM | POA: Diagnosis not present

## 2021-03-27 ENCOUNTER — Other Ambulatory Visit: Payer: Self-pay | Admitting: Cardiovascular Disease

## 2021-03-28 DIAGNOSIS — H903 Sensorineural hearing loss, bilateral: Secondary | ICD-10-CM | POA: Diagnosis not present

## 2021-03-29 NOTE — Telephone Encounter (Signed)
Rx(s) sent to pharmacy electronically.  

## 2021-05-25 DIAGNOSIS — C61 Malignant neoplasm of prostate: Secondary | ICD-10-CM | POA: Diagnosis not present

## 2021-06-02 DIAGNOSIS — R519 Headache, unspecified: Secondary | ICD-10-CM | POA: Diagnosis not present

## 2021-06-02 DIAGNOSIS — M791 Myalgia, unspecified site: Secondary | ICD-10-CM | POA: Diagnosis not present

## 2021-06-02 DIAGNOSIS — U071 COVID-19: Secondary | ICD-10-CM | POA: Diagnosis not present

## 2021-06-02 DIAGNOSIS — R051 Acute cough: Secondary | ICD-10-CM | POA: Diagnosis not present

## 2021-07-05 DIAGNOSIS — E782 Mixed hyperlipidemia: Secondary | ICD-10-CM | POA: Diagnosis not present

## 2021-07-05 DIAGNOSIS — J309 Allergic rhinitis, unspecified: Secondary | ICD-10-CM | POA: Diagnosis not present

## 2021-07-05 DIAGNOSIS — K219 Gastro-esophageal reflux disease without esophagitis: Secondary | ICD-10-CM | POA: Diagnosis not present

## 2021-07-05 DIAGNOSIS — I10 Essential (primary) hypertension: Secondary | ICD-10-CM | POA: Diagnosis not present

## 2021-07-24 DIAGNOSIS — N5201 Erectile dysfunction due to arterial insufficiency: Secondary | ICD-10-CM | POA: Diagnosis not present

## 2021-07-24 DIAGNOSIS — C61 Malignant neoplasm of prostate: Secondary | ICD-10-CM | POA: Diagnosis not present

## 2021-07-24 DIAGNOSIS — R3915 Urgency of urination: Secondary | ICD-10-CM | POA: Diagnosis not present

## 2021-09-13 DIAGNOSIS — H31091 Other chorioretinal scars, right eye: Secondary | ICD-10-CM | POA: Diagnosis not present

## 2021-09-13 DIAGNOSIS — D492 Neoplasm of unspecified behavior of bone, soft tissue, and skin: Secondary | ICD-10-CM | POA: Diagnosis not present

## 2021-09-13 DIAGNOSIS — H33051 Total retinal detachment, right eye: Secondary | ICD-10-CM | POA: Diagnosis not present

## 2021-09-13 DIAGNOSIS — H04123 Dry eye syndrome of bilateral lacrimal glands: Secondary | ICD-10-CM | POA: Diagnosis not present

## 2021-10-04 DIAGNOSIS — L820 Inflamed seborrheic keratosis: Secondary | ICD-10-CM | POA: Diagnosis not present

## 2021-10-04 DIAGNOSIS — D485 Neoplasm of uncertain behavior of skin: Secondary | ICD-10-CM | POA: Diagnosis not present

## 2021-10-29 ENCOUNTER — Other Ambulatory Visit: Payer: Self-pay | Admitting: Cardiovascular Disease

## 2021-11-07 NOTE — Progress Notes (Signed)
Date:  11/13/2021   ID:  Kurt Diaz, DOB 1940/11/17, MRN [id number]  Provider Location: Office  PCP:  Kurt Contras, MD  Cardiologist:   Kurt Diaz Electrophysiologist:  None   Evaluation Performed:  Follow-Up Visit  Chief Complaint:   CAD/CABG  History of Present Illness:    Kurt Diaz is a 81 y.o. male who presents for f/u CAD/CABG   MI 12-20-04 sudden death airport [place]. Stenting LAD and RCA. 10/25/16 new stents to proximal and distal RCA. EF 45-50% More angina October 2019 had CABG 07/03/18 with LIMA to LAD, SVG to D1, and SVG to RCA.  Seen back in ER 07/28/18 For chest pain. Trivial effusion , R/O CXR ok. D/c home    BP good at home  Some white coat component    Travels a lot [place] originally from [place]. 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 Fell and cracked left patella 12/21/18 Healed Followed by [facility]   Being Rx for prostate cancer PSA 30 February 2021 and XRT started  Sees Dr Kurt Diaz urology    Describes rare angina lasting a minute Has not taken nitro for it Radiates to jaw Discussed need for stress testing and better BP control      Past Medical History:  Diagnosis Date   Chest pain    Coronary artery disease    Coronary syndrome, acute ([place]) 2004/12/20   Hyperlipidemia    Hypertension    Prostate cancer ([place])    Syncope and collapse    Past Surgical History:  Procedure Laterality Date   CARDIAC CATHETERIZATION  10/13/2009   EF 65%   CARDIAC CATHETERIZATION N/A 10/25/2016   Procedure: Left Heart Cath and Coronary Angiography;  Surgeon: Kurt Crome, MD;  Location: [facility] [facility];  Service: Cardiovascular;  Laterality: N/A;   CARDIAC CATHETERIZATION N/A 10/25/2016   Procedure: Coronary Stent Intervention;  Surgeon: Kurt Crome, MD;  Location: [place] [facility];  Service: Cardiovascular;  Laterality: N/A;   CARDIAC CATHETERIZATION N/A 10/25/2016   Procedure: Intravascular  Pressure Wire/FFR Study;  Surgeon: Kurt Crome, MD;  Location: [place] [facility];  Service: Cardiovascular;  Laterality: N/A;   CARDIOVASCULAR STRESS TEST  09/29/2009   EF 70%   CORONARY ANGIOPLASTY     STENTING OF HIS LAD, STENTING OF HIS RIGHT CORONARY.   CORONARY ARTERY BYPASS GRAFT N/A 07/03/2018   Procedure: Coronary artery bypass grafting times three using left internal mammary artery and right greater saphenous vein harvested endoscopically.;  Surgeon: Kurt Pollack, MD;  [facility];  Service: Open Heart Surgery;  Laterality: N/A;   INTRAVASCULAR PRESSURE WIRE/FFR STUDY N/A 06/08/2018   Procedure: INTRAVASCULAR PRESSURE WIRE/FFR STUDY;  Surgeon: Kurt Bush, MD;  Location: [place] [facility];  Service: Cardiovascular;  Laterality: N/A;   KNEE SURGERY Left    LEFT HEART CATH AND CORONARY ANGIOGRAPHY N/A 06/08/2018   Procedure: LEFT HEART CATH AND CORONARY ANGIOGRAPHY;  Surgeon: Kurt Bush, MD;  Location: [facility] [facility];  Service: Cardiovascular;  Laterality: N/A;   PROSTATE BIOPSY     TEE WITHOUT CARDIOVERSION N/A 07/03/2018   Procedure: TRANSESOPHAGEAL ECHOCARDIOGRAM (TEE);  Surgeon: Kurt Pollack, MD;  Location: [facility];  Service: Open Heart Surgery;  Laterality: N/A;   US ECHOCARDIOGRAPHY  12/15/2009   EF 55-60%     Current Meds  Medication Sig   acetaminophen (TYLENOL) 325 MG tablet Take 2 tablets (  650 mg total) by mouth every 6 (six) hours as needed for mild pain.   aspirin EC 81 MG tablet Take 81 mg by mouth daily.   atorvastatin (LIPITOR) 80 MG tablet TAKE 1 TABLET DAILY AT 6 P.M. (PLEASE MAKE YEARLY APPOINTMENT WITH DR. Johnsie Diaz FOR SEPTEMBER BEFORE ANYMORE REFILLS [phone])   lisinopril (ZESTRIL) 2.5 MG tablet TAKE 1 TABLET BY MOUTH EVERY DAY   metoprolol tartrate (LOPRESSOR) 25 MG tablet TAKE ONE-HALF TABLET BY MOUTH TWICE DAILY. PLEASE KEEP YOUR UPCOMING APPOINTMENT FOR ANY FURTHER REFILLS   Multiple Vitamin (MULTIVITAMIN WITH MINERALS) TABS tablet  Take 1 tablet by mouth daily after supper.    nitroGLYCERIN (NITROSTAT) 0.4 MG SL tablet Place 1 tablet (0.4 mg total) under the tongue as needed for chest pain.     Allergies:   Patient has no known allergies.   Social History   Tobacco Use   Smoking status: Never   Smokeless tobacco: Never  Vaping Use   Vaping Use: Never used  Substance Use Topics   Alcohol use: No   Drug use: No     Family Hx: The patient's family history includes Breast cancer in his mother; Heart attack in his mother; Lung cancer in his father; Stroke in his mother. There is no history of Colon cancer, Pancreatic cancer, or Prostate cancer.  ROS:   Please see the history of present illness.     All other systems reviewed and are negative.   Prior CV studies:   The following studies were reviewed today:  Cath 06/11/18  Labs/Other Tests and Data Reviewed:    EKG:  11/13/2021 NSR rate 70 old IMI   Recent Labs: No results found for requested labs within last 8760 hours.   Recent Lipid Panel Lab Results  Component Value Date/Time   CHOL 99 (L) 12/16/2016 07:30 AM   TRIG 75 12/16/2016 07:30 AM   HDL 34 (L) 12/16/2016 07:30 AM   CHOLHDL 2.9 12/16/2016 07:30 AM   CHOLHDL 4 12/30/2014 07:41 AM   LDLCALC 50 12/16/2016 07:30 AM    Wt Readings from Last 3 Encounters:  11/13/21 192 lb (87.1 kg)  12/22/20 190 lb (86.2 kg)  06/20/20 188 lb 6.4 oz (85.5 kg)     Objective:    Vital Signs:  BP (!) 150/88    Pulse 63    Ht 5\' 8"  (1.727 m)    Wt 192 lb (87.1 kg)    SpO2 98%    BMI 29.19 kg/m    Affect appropriate Healthy:  appears stated age 20: normal Neck supple with no adenopathy JVP normal no bruits no thyromegaly Lungs clear with no wheezing and good diaphragmatic motion Heart:  S1/S2 no murmur, no rub, gallop or click PMI normal Abdomen: benighn, BS positve, no tenderness, no AAA no bruit.  No HSM or HJR Distal pulses intact with no bruits No edema Neuro non-focal Skin warm and  dry No muscular weakness    ASSESSMENT & PLAN:    1. CAD with CABG X 3 07/03/18 . ASA 81 mg and statin  Has nitro ex myovue ordered low threshold to cath if positive   2.  HLD : continue high dose statin labs with primary    3.  HTN: Increase lisinopril to 5 mg daily   4. Prostate Cancer:  F/U Karsten Ro and Tammi Klippel post XRT Rx follow PSA     F/U with me  post stress testing   COVID-19 Education: The signs and symptoms of COVID-19 were  discussed with the patient and how to seek care for testing (follow up with PCP or arrange E-visit).  The importance of social distancing was discussed today.  Time:   Today, I have spent 30 minutes with the patient     Medication Adjustments/Labs and Tests Ordered: Current medicines are reviewed at length with the patient today.  Concerns regarding medicines are outlined above.   Tests Ordered:  None   Medication Changes:  None   Disposition:  Follow up  in a year  Signed, Jenkins Rouge, MD  11/13/2021 8:21 AM    [place]

## 2021-11-13 ENCOUNTER — Other Ambulatory Visit: Payer: Self-pay

## 2021-11-13 ENCOUNTER — Encounter: Payer: Self-pay | Admitting: Cardiovascular Disease

## 2021-11-13 ENCOUNTER — Ambulatory Visit: Payer: Medicare Other | Admitting: Cardiovascular Disease

## 2021-11-13 VITALS — BP 150/88 | HR 63 | Ht 68.0 in | Wt 192.0 lb

## 2021-11-13 DIAGNOSIS — Z951 Presence of aortocoronary bypass graft: Secondary | ICD-10-CM

## 2021-11-13 DIAGNOSIS — E782 Mixed hyperlipidemia: Secondary | ICD-10-CM | POA: Diagnosis not present

## 2021-11-13 DIAGNOSIS — I209 Angina pectoris, unspecified: Secondary | ICD-10-CM

## 2021-11-13 DIAGNOSIS — R072 Precordial pain: Secondary | ICD-10-CM

## 2021-11-13 MED ORDER — LISINOPRIL 5 MG PO TABS: 5.0000 mg | ORAL_TABLET | Freq: Every day | ORAL | 3 refills | Status: DC

## 2021-11-13 NOTE — Patient Instructions (Addendum)
Medication Instructions:  Your physician has recommended you make the following change in your medication:  1-INCREASE lisinopril 5 mg by mouth daily.  *If you need a refill on your cardiac medications before your next appointment, please call your pharmacy*  Lab Work: If you have labs (blood work) drawn today and your tests are completely normal, you will receive your results only by: [place] (if you have MyChart) OR A paper copy in the mail If you have any lab test that is abnormal or we need to change your treatment, we will call you to review the results.  Testing/Procedures: Your physician has requested that you have en exercise stress myoview. For further information please visit [email or web address]. Please follow instruction sheet, as given.  Follow-Up: At [facility], you and your health needs are our priority.  As part of our continuing mission to provide you with exceptional heart care, we have created designated Provider Care Teams.  These Care Teams include your primary Cardiologist (physician) and Advanced Practice Providers (APPs -  Physician Assistants and Nurse Practitioners) who all work together to provide you with the care you need, when you need it.  We recommend signing up for the patient portal called "MyChart".  Sign up information is provided on this After Visit Summary.  MyChart is used to connect with patients for Virtual Visits (Telemedicine).  Patients are able to view lab/test results, encounter notes, upcoming appointments, etc.  Non-urgent messages can be sent to your provider as well.   To learn more about what you can do with MyChart, go to [email or web address].    Your next appointment:   6 month(s)  The format for your next appointment:   In Person  Provider:   Jenkins Rouge, MD {

## 2021-11-14 ENCOUNTER — Telehealth (HOSPITAL_COMMUNITY): Payer: Self-pay | Admitting: *Deleted

## 2021-11-14 NOTE — Telephone Encounter (Signed)
Left message on voicemail per DPR in reference to upcoming appointment scheduled on  11/21/21 with detailed instructions given per Myocardial Perfusion Study Information Sheet for the test. LM to arrive 15 minutes early, and that it is imperative to arrive on time for appointment to keep from having the test rescheduled. If you need to cancel or reschedule your appointment, please call the office within 24 hours of your appointment. Failure to do so may result in a cancellation of your appointment, and a $50 no show fee. Phone number given for call back for any questions. Judah Chevere Jacqueline ° ° °

## 2021-11-21 ENCOUNTER — Other Ambulatory Visit: Payer: Self-pay

## 2021-11-21 ENCOUNTER — Ambulatory Visit (HOSPITAL_COMMUNITY): Payer: Medicare Other | Attending: Cardiology

## 2021-11-21 DIAGNOSIS — R072 Precordial pain: Secondary | ICD-10-CM | POA: Diagnosis not present

## 2021-11-21 LAB — MYOCARDIAL PERFUSION IMAGING
Angina Index: 0
Duke Treadmill Score: 3
Estimated workload: 10.1
Exercise duration (min): 8 min
Exercise duration (sec): 0 s
LV dias vol: 67 mL (ref 62–150)
LV sys vol: 27 mL
MPHR: 140 {beats}/min
Nuc Stress EF: 59 %
Peak HR: 160 {beats}/min
Percent HR: 114 %
Rest HR: 71 {beats}/min
Rest Nuclear Isotope Dose: 10.2 mCi
SDS: 3
SRS: 1
SSS: 4
ST Depression (mm): 1 mm
ST Elevation (mm): 0 mm
Stress Nuclear Isotope Dose: 32.7 mCi
TID: 0.99

## 2021-11-21 MED ORDER — TECHNETIUM TC 99M TETROFOSMIN IV KIT
32.7000 | PACK | Freq: Once | INTRAVENOUS | Status: AC | PRN
  Administered 2021-11-21: 32.7 via INTRAVENOUS
  Filled 2021-11-21: qty 33

## 2021-11-21 MED ORDER — TECHNETIUM TC 99M TETROFOSMIN IV KIT
10.2000 | PACK | Freq: Once | INTRAVENOUS | Status: AC | PRN
  Administered 2021-11-21: 10.2 via INTRAVENOUS
  Filled 2021-11-21: qty 11

## 2022-01-25 ENCOUNTER — Other Ambulatory Visit: Payer: Self-pay | Admitting: Cardiovascular Disease

## 2022-01-28 DIAGNOSIS — Z0000 Encounter for general adult medical examination without abnormal findings: Secondary | ICD-10-CM | POA: Diagnosis not present

## 2022-01-28 DIAGNOSIS — K219 Gastro-esophageal reflux disease without esophagitis: Secondary | ICD-10-CM | POA: Diagnosis not present

## 2022-01-28 DIAGNOSIS — E782 Mixed hyperlipidemia: Secondary | ICD-10-CM | POA: Diagnosis not present

## 2022-01-28 DIAGNOSIS — I10 Essential (primary) hypertension: Secondary | ICD-10-CM | POA: Diagnosis not present

## 2022-02-05 DIAGNOSIS — Z8546 Personal history of malignant neoplasm of prostate: Secondary | ICD-10-CM | POA: Diagnosis not present

## 2022-02-12 DIAGNOSIS — N3943 Post-void dribbling: Secondary | ICD-10-CM | POA: Diagnosis not present

## 2022-02-12 DIAGNOSIS — C61 Malignant neoplasm of prostate: Secondary | ICD-10-CM | POA: Diagnosis not present

## 2022-02-12 DIAGNOSIS — N5201 Erectile dysfunction due to arterial insufficiency: Secondary | ICD-10-CM | POA: Diagnosis not present

## 2022-04-12 NOTE — Progress Notes (Signed)
Date:  04/12/2022   ID:  [name], DOB 09/18/1941, MRN [id number]  Provider Location: Office  PCP:  Antony Contras, MD  Cardiologist:   [name] Electrophysiologist:  None   Evaluation Performed:  Follow-Up Visit  Chief Complaint:   CAD/CABG  History of Present Illness:    DON TIU is a 81 y.o. male who presents for f/u CAD/CABG   MI 2004/12/24 sudden death airport [place]. Stenting LAD and RCA. 10/25/16 new stents to proximal and distal RCA. EF 45-50% More angina October 2019 had CABG 07/03/18 with LIMA to LAD, SVG to D1, and SVG to RCA.  Seen back in ER 07/28/18 For chest pain. Trivial effusion , R/O CXR ok. D/c home    BP good at home  Some white coat component    Travels a lot [place] originally from [place]. 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 Fell and cracked left patella 12/25/18 Healed Followed by [facility]   Being Rx for prostate cancer PSA 30 February 2021 and XRT started  Sees Dr Louis Meckel urology    10/2021 had some chest pain BP elevated  Myovue 11/21/21 low risk no ischemia small septal infarct EF normal 59% Lisinopril dose increased to 5 mg daily    ***   Past Medical History:  Diagnosis Date   Chest pain    Coronary artery disease    Coronary syndrome, acute ([place]) 12-24-2004   Hyperlipidemia    Hypertension    Prostate cancer ([place])    Syncope and collapse    Past Surgical History:  Procedure Laterality Date   CARDIAC CATHETERIZATION  10/13/2009   EF 65%   CARDIAC CATHETERIZATION N/A 10/25/2016   Procedure: Left Heart Cath and Coronary Angiography;  Surgeon: Belva Crome, MD;  Location: [place] [facility];  Service: Cardiovascular;  Laterality: N/A;   CARDIAC CATHETERIZATION N/A 10/25/2016   Procedure: Coronary Stent Intervention;  Surgeon: Belva Crome, MD;  Location: [place] [facility];  Service: Cardiovascular;  Laterality: N/A;   CARDIAC CATHETERIZATION N/A 10/25/2016   Procedure:  Intravascular Pressure Wire/FFR Study;  Surgeon: Belva Crome, MD;  Location: [facility];  Service: Cardiovascular;  Laterality: N/A;   CARDIOVASCULAR STRESS TEST  09/29/2009   EF 70%   CORONARY ANGIOPLASTY     STENTING OF HIS LAD, STENTING OF HIS RIGHT CORONARY.   CORONARY ARTERY BYPASS GRAFT N/A 07/03/2018   Procedure: Coronary artery bypass grafting times three using left internal mammary artery and right greater saphenous vein harvested endoscopically.;  Surgeon: Gaye Pollack, MD;  [facility];  Service: Open Heart Surgery;  Laterality: N/A;   INTRAVASCULAR PRESSURE WIRE/FFR STUDY N/A 06/08/2018   Procedure: INTRAVASCULAR PRESSURE WIRE/FFR STUDY;  Surgeon: Nelva Bush, MD;  Location: [facility] [facility];  Service: Cardiovascular;  Laterality: N/A;   KNEE SURGERY Left    LEFT HEART CATH AND CORONARY ANGIOGRAPHY N/A 06/08/2018   Procedure: LEFT HEART CATH AND CORONARY ANGIOGRAPHY;  Surgeon: Nelva Bush, MD;  Location: [place] [facility];  Service: Cardiovascular;  Laterality: N/A;   PROSTATE BIOPSY     TEE WITHOUT CARDIOVERSION N/A 07/03/2018   Procedure: TRANSESOPHAGEAL ECHOCARDIOGRAM (TEE);  Surgeon: Gaye Pollack, MD;  Location: [facility];  Service: Open Heart Surgery;  Laterality: N/A;   US ECHOCARDIOGRAPHY  12/15/2009   EF 55-60%     No outpatient medications have been marked as taking for the 04/15/22  encounter (Appointment) with Josue Hector, MD.     Allergies:   Patient has no known allergies.   Social History   Tobacco Use   Smoking status: Never   Smokeless tobacco: Never  Vaping Use   Vaping Use: Never used  Substance Use Topics   Alcohol use: No   Drug use: No     Family Hx: The patient's family history includes Breast cancer in his mother; Heart attack in his mother; Lung cancer in his father; Stroke in his mother. There is no history of Colon cancer, Pancreatic cancer, or Prostate cancer.  ROS:   Please see the history of present illness.      All other systems reviewed and are negative.   Prior CV studies:   The following studies were reviewed today:  Cath 06/11/18  Labs/Other Tests and Data Reviewed:    EKG:  04/12/2022 NSR rate 97 old IMI   Recent Labs: No results found for requested labs within last 365 days.   Recent Lipid Panel Lab Results  Component Value Date/Time   CHOL 99 (L) 12/16/2016 07:30 AM   TRIG 75 12/16/2016 07:30 AM   HDL 34 (L) 12/16/2016 07:30 AM   CHOLHDL 2.9 12/16/2016 07:30 AM   CHOLHDL 4 12/30/2014 07:41 AM   LDLCALC 50 12/16/2016 07:30 AM    Wt Readings from Last 3 Encounters:  11/21/21 192 lb (87.1 kg)  11/13/21 192 lb (87.1 kg)  12/22/20 190 lb (86.2 kg)     Objective:    Vital Signs:  There were no vitals taken for this visit.   Affect appropriate Healthy:  appears stated age 84: normal Neck supple with no adenopathy JVP normal no bruits no thyromegaly Lungs clear with no wheezing and good diaphragmatic motion Heart:  S1/S2 no murmur, no rub, gallop or click PMI normal Abdomen: benighn, BS positve, no tenderness, no AAA no bruit.  No HSM or HJR Distal pulses intact with no bruits No edema Neuro non-focal Skin warm and dry No muscular weakness    ASSESSMENT & PLAN:    1. CAD with CABG X 3 07/03/18 . ASA 81 mg and statin  Has nitro ex myovue non ischemic 11/21/21 see above continue medical Rx    2.  HLD : continue high dose statin labs with primary    3.  HTN: lisinopril dose increased  11/13/21 ***  4. Prostate Cancer:  F/U Karsten Ro and Tammi Klippel post XRT Rx follow PSA     ***  Disposition:  Follow up  in a year  Signed, Jenkins Rouge, MD  04/12/2022 5:12 PM    [place]

## 2022-04-15 ENCOUNTER — Encounter: Payer: Self-pay | Admitting: Cardiovascular Disease

## 2022-04-15 ENCOUNTER — Ambulatory Visit: Payer: Medicare Other | Admitting: Cardiovascular Disease

## 2022-04-15 VITALS — BP 120/68 | HR 63 | Ht >= 80 in | Wt 188.0 lb

## 2022-04-15 DIAGNOSIS — I10 Essential (primary) hypertension: Secondary | ICD-10-CM | POA: Diagnosis not present

## 2022-04-15 DIAGNOSIS — Z951 Presence of aortocoronary bypass graft: Secondary | ICD-10-CM | POA: Diagnosis not present

## 2022-04-15 DIAGNOSIS — E782 Mixed hyperlipidemia: Secondary | ICD-10-CM | POA: Diagnosis not present

## 2022-04-15 DIAGNOSIS — I209 Angina pectoris, unspecified: Secondary | ICD-10-CM

## 2022-04-15 NOTE — Patient Instructions (Signed)
Medication Instructions:  Your physician recommends that you continue on your current medications as directed. Please refer to the Current Medication list given to you today.  *If you need a refill on your cardiac medications before your next appointment, please call your pharmacy*  Lab Work: If you have labs (blood work) drawn today and your tests are completely normal, you will receive your results only by: [place] (if you have MyChart) OR A paper copy in the mail If you have any lab test that is abnormal or we need to change your treatment, we will call you to review the results.  Testing/Procedures: None ordered today.  Follow-Up: At [facility], you and your health needs are our priority.  As part of our continuing mission to provide you with exceptional heart care, we have created designated Provider Care Teams.  These Care Teams include your primary Cardiologist (physician) and Advanced Practice Providers (APPs -  Physician Assistants and Nurse Practitioners) who all work together to provide you with the care you need, when you need it.  We recommend signing up for the patient portal called "MyChart".  Sign up information is provided on this After Visit Summary.  MyChart is used to connect with patients for Virtual Visits (Telemedicine).  Patients are able to view lab/test results, encounter notes, upcoming appointments, etc.  Non-urgent messages can be sent to your provider as well.   To learn more about what you can do with MyChart, go to [email or web address].    Your next appointment:   12 month(s)  The format for your next appointment:   In Person  Provider:   Jenkins Rouge, MD {  Other Instructions   Important Information About [place]

## 2022-05-07 ENCOUNTER — Ambulatory Visit: Payer: Medicare Other | Admitting: Cardiovascular Disease

## 2022-07-17 ENCOUNTER — Other Ambulatory Visit: Payer: Self-pay | Admitting: Cardiovascular Disease

## 2022-07-19 DIAGNOSIS — C61 Malignant neoplasm of prostate: Secondary | ICD-10-CM | POA: Diagnosis not present

## 2022-07-26 DIAGNOSIS — N3943 Post-void dribbling: Secondary | ICD-10-CM | POA: Diagnosis not present

## 2022-07-26 DIAGNOSIS — C61 Malignant neoplasm of prostate: Secondary | ICD-10-CM | POA: Diagnosis not present

## 2022-08-30 DIAGNOSIS — K219 Gastro-esophageal reflux disease without esophagitis: Secondary | ICD-10-CM | POA: Diagnosis not present

## 2022-08-30 DIAGNOSIS — E782 Mixed hyperlipidemia: Secondary | ICD-10-CM | POA: Diagnosis not present

## 2022-08-30 DIAGNOSIS — I10 Essential (primary) hypertension: Secondary | ICD-10-CM | POA: Diagnosis not present

## 2022-08-30 DIAGNOSIS — J309 Allergic rhinitis, unspecified: Secondary | ICD-10-CM | POA: Diagnosis not present

## 2022-09-25 DIAGNOSIS — R051 Acute cough: Secondary | ICD-10-CM | POA: Diagnosis not present

## 2022-09-25 DIAGNOSIS — R509 Fever, unspecified: Secondary | ICD-10-CM | POA: Diagnosis not present

## 2022-09-25 DIAGNOSIS — Z03818 Encounter for observation for suspected exposure to other biological agents ruled out: Secondary | ICD-10-CM | POA: Diagnosis not present

## 2022-09-25 DIAGNOSIS — R0981 Nasal congestion: Secondary | ICD-10-CM | POA: Diagnosis not present

## 2022-09-25 DIAGNOSIS — R52 Pain, unspecified: Secondary | ICD-10-CM | POA: Diagnosis not present

## 2022-11-14 ENCOUNTER — Other Ambulatory Visit: Payer: Self-pay | Admitting: Cardiovascular Disease

## 2023-01-21 DIAGNOSIS — C61 Malignant neoplasm of prostate: Secondary | ICD-10-CM | POA: Diagnosis not present

## 2023-01-28 DIAGNOSIS — C61 Malignant neoplasm of prostate: Secondary | ICD-10-CM | POA: Diagnosis not present

## 2023-01-28 DIAGNOSIS — N5201 Erectile dysfunction due to arterial insufficiency: Secondary | ICD-10-CM | POA: Diagnosis not present

## 2023-01-28 DIAGNOSIS — N3943 Post-void dribbling: Secondary | ICD-10-CM | POA: Diagnosis not present

## 2023-02-18 DIAGNOSIS — R0989 Other specified symptoms and signs involving the circulatory and respiratory systems: Secondary | ICD-10-CM | POA: Diagnosis not present

## 2023-02-18 DIAGNOSIS — E782 Mixed hyperlipidemia: Secondary | ICD-10-CM | POA: Diagnosis not present

## 2023-02-18 DIAGNOSIS — Z0000 Encounter for general adult medical examination without abnormal findings: Secondary | ICD-10-CM | POA: Diagnosis not present

## 2023-02-18 DIAGNOSIS — Z1331 Encounter for screening for depression: Secondary | ICD-10-CM | POA: Diagnosis not present

## 2023-02-18 DIAGNOSIS — I10 Essential (primary) hypertension: Secondary | ICD-10-CM | POA: Diagnosis not present

## 2023-03-26 NOTE — Progress Notes (Addendum)
Date:  04/07/2023   ID:  Kurt Diaz, DOB [birth year], MRN [id number]  Provider Location: Office  PCP:  Tally Joe, MD  Cardiologist:   Eden Emms Electrophysiologist:  None   Evaluation Performed:  Follow-Up Visit  Chief Complaint:   CAD/CABG  History of Present Illness:    Kurt Diaz is a 82 y.o. male who presents for f/u CAD/CABG   MI 11-May-2005 sudden death airport [place]. Stenting LAD and RCA. 10/25/16 new stents to proximal and distal RCA. EF 45-50% More angina October 2019 had CABG 07/03/18 with LIMA to LAD, SVG to D1, and SVG to RCA.  Seen back in ER 07/28/18 For chest pain. Trivial effusion , R/O CXR ok. 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 Fell and cracked left patella 05-12-2019 Healed Followed by [facility]   Being Rx for prostate cancer PSA 30 February 2021 and XRT started  Sees Dr Marlou Porch urology    10/2021 had some chest pain BP elevated  Myovue 11/21/21 low risk no ischemia small septal infarct EF normal 59% Lisinopril dose increased to 5 mg daily    Wife has a bad knee and needs to be fixed  Still doing work at [employer]  Pulse lower last year or so ECG today with SB PACls Discussed cutting lopressor back to 12.5 mg once/day   Past Medical History:  Diagnosis Date   Chest pain    Coronary artery disease    Coronary syndrome, acute ([facility]) May 11, 2005   Hyperlipidemia    Hypertension    Prostate cancer ([facility])    Syncope and collapse    Past Surgical History:  Procedure Laterality Date   CARDIAC CATHETERIZATION  10/13/2009   EF 65%   CARDIAC CATHETERIZATION N/A 10/25/2016   Procedure: Left Heart Cath and Coronary Angiography;  Surgeon: Lyn Records, MD;  Location: [facility];  Service: Cardiovascular;  Laterality: N/A;   CARDIAC CATHETERIZATION N/A 10/25/2016   Procedure: Coronary Stent  Intervention;  Surgeon: Lyn Records, MD;  Location: [facility] IN[facility];  Service: Cardiovascular;  Laterality: N/A;   CARDIAC CATHETERIZATION N/A 10/25/2016   Procedure: Intravascular Pressure Wire/FFR Study;  Surgeon: Lyn Records, MD;  Location: [facility] INVAS[facility];  Service: Cardiovascular;  Laterality: N/A;   CARDIOVASCULAR STRESS TEST  09/29/2009   EF 70%   CORONARY ANGIOPLASTY     STENTING OF HIS LAD, STENTING OF HIS RIGHT CORONARY.   CORONARY ARTERY BYPASS GRAFT N/A 07/03/2018   Procedure: Coronary artery bypass grafting times three using left internal mammary artery and right greater saphenous vein harvested endoscopically.;  Surgeon: Alleen Borne, MD;  [facility];  Service: Open Heart Surgery;  Laterality: N/A;   CORONARY PRESSURE/FFR STUDY N/A 06/08/2018   Procedure: INTRAVASCULAR PRESSURE WIRE/FFR STUDY;  Surgeon: Yvonne Kendall, MD;  [facility];  Service: Cardiovascular;  Laterality: N/A;   KNEE SURGERY Left    LEFT HEART CATH AND CORONARY ANGIOGRAPHY N/A 06/08/2018   Procedure: LEFT HEART CATH AND CORONARY ANGIOGRAPHY;  Surgeon: Yvonne Kendall, MD;  [facility];  Service: Cardiovascular;  Laterality: N/A;   PROSTATE BIOPSY     TEE WITHOUT CARDIOVERSION N/A 07/03/2018   Procedure: TRANSESOPHAGEAL ECHOCARDIOGRAM (TEE);  Surgeon: Alleen Borne, MD;  [facility];  Service: Open Heart Surgery;  Laterality: N/A;   US ECHOCARDIOGRAPHY  12/15/2009   EF 55-60%     Current Meds  Medication Sig   acetaminophen (TYLENOL) 325 MG tablet Take 2 tablets (650 mg total) by mouth every 6 (six) hours as needed for mild pain.   aspirin EC 81 MG tablet Take 81 mg by mouth daily.   atorvastatin (LIPITOR) 80 MG tablet TAKE 1 TABLET DAILY AT 6 P.M. (PLEASE MAKE YEARLY APPOINTMENT WITH DR. Eden Emms FOR SEPTEMBER BEFORE ANYMORE REFILLS [phone])   lisinopril (ZESTRIL) 5 MG tablet TAKE 1 TABLET(5 MG) BY MOUTH DAILY   metoprolol tartrate (LOPRESSOR) 25 MG  tablet TAKE 1/2 TABLET BY MOUTH TWICE DAILY   Multiple Vitamin (MULTIVITAMIN WITH MINERALS) TABS tablet Take 1 tablet by mouth daily after supper.    nitroGLYCERIN (NITROSTAT) 0.4 MG SL tablet Place 1 tablet (0.4 mg total) under the tongue as needed for chest pain.     Allergies:   Patient has no known allergies.   Social History   Tobacco Use   Smoking status: Never   Smokeless tobacco: Never  Vaping Use   Vaping status: Never Used  Substance Use Topics   Alcohol use: No   Drug use: No     Family Hx: The patient's family history includes Breast cancer in his mother; Heart attack in his mother; Lung cancer in his father; Stroke in his mother. There is no history of Colon cancer, Pancreatic cancer, or Prostate cancer.  ROS:   Please see the history of present illness.     All other systems reviewed and are negative.   Prior CV studies:   The following studies were reviewed today:  Cath 06/11/18  Labs/Other Tests and Data Reviewed:    EKG:  04/07/2023 NSR rate 85 old IMI   Recent Labs: No results found for requested labs within last 365 days.   Recent Lipid Panel Lab Results  Component Value Date/Time   CHOL 99 (L) 12/16/2016 07:30 AM   TRIG 75 12/16/2016 07:30 AM   HDL 34 (L) 12/16/2016 07:30 AM   CHOLHDL 2.9 12/16/2016 07:30 AM   CHOLHDL 4 12/30/2014 07:41 AM   LDLCALC 50 12/16/2016 07:30 AM    Wt Readings from Last 3 Encounters:  04/07/23 186 lb 9.6 oz (84.6 kg)  04/15/22 188 lb (85.3 kg)  11/21/21 192 lb (87.1 kg)     Objective:    Vital Signs:  BP 124/70   Pulse (!) 55   Ht 5\' 7"  (1.702 m)   Wt 186 lb 9.6 oz (84.6 kg)   SpO2 97%   BMI 29.23 kg/m    Affect appropriate Healthy:  appears stated age HEENT: normal Neck supple with no adenopathy JVP normal no bruits no thyromegaly Lungs clear with no wheezing and good diaphragmatic motion Heart:  S1/S2 no murmur, no rub, gallop or click PMI normal Abdomen: benighn, BS positve, no tenderness, no  AAA no bruit.  No HSM or HJR Distal pulses intact with no bruits No edema Neuro non-focal Skin warm and dry No muscular weakness    ASSESSMENT & PLAN:    1. CAD with CABG X 3 07/03/18 . ASA 81 mg and statin  Has nitro ex myovue non ischemic 11/21/21 see above continue medical Rx    2.  HLD : continue high dose statin labs with primary    3.  HTN: lisinopril dose increased  11/13/21 improved  4. Prostate Cancer:  F/U [place]  and [place] post XRT Rx follow PSA    5. Bradycardia: decrease lopressor 12.5 mg daily ECG no AV block PACls      Disposition:  Follow up  in a year  Signed, Charlton Haws, MD  04/07/2023 8:50 AM    [facility]

## 2023-04-07 ENCOUNTER — Encounter: Payer: Self-pay | Admitting: Cardiovascular Disease

## 2023-04-07 ENCOUNTER — Ambulatory Visit: Payer: Medicare Other | Attending: Cardiovascular Disease | Admitting: Cardiovascular Disease

## 2023-04-07 VITALS — BP 124/70 | HR 55 | Ht 67.0 in | Wt 186.6 lb

## 2023-04-07 DIAGNOSIS — I10 Essential (primary) hypertension: Secondary | ICD-10-CM

## 2023-04-07 NOTE — Patient Instructions (Signed)

## 2023-05-09 ENCOUNTER — Other Ambulatory Visit: Payer: Self-pay | Admitting: Cardiovascular Disease

## 2023-07-03 DIAGNOSIS — H31091 Other chorioretinal scars, right eye: Secondary | ICD-10-CM | POA: Diagnosis not present

## 2023-07-03 DIAGNOSIS — Z961 Presence of intraocular lens: Secondary | ICD-10-CM | POA: Diagnosis not present

## 2023-07-03 DIAGNOSIS — H04123 Dry eye syndrome of bilateral lacrimal glands: Secondary | ICD-10-CM | POA: Diagnosis not present

## 2023-07-03 DIAGNOSIS — H33051 Total retinal detachment, right eye: Secondary | ICD-10-CM | POA: Diagnosis not present

## 2023-08-11 DIAGNOSIS — I25119 Atherosclerotic heart disease of native coronary artery with unspecified angina pectoris: Secondary | ICD-10-CM | POA: Diagnosis not present

## 2023-08-11 DIAGNOSIS — E782 Mixed hyperlipidemia: Secondary | ICD-10-CM | POA: Diagnosis not present

## 2023-08-11 DIAGNOSIS — J309 Allergic rhinitis, unspecified: Secondary | ICD-10-CM | POA: Diagnosis not present

## 2023-08-11 DIAGNOSIS — K219 Gastro-esophageal reflux disease without esophagitis: Secondary | ICD-10-CM | POA: Diagnosis not present

## 2023-08-11 DIAGNOSIS — I10 Essential (primary) hypertension: Secondary | ICD-10-CM | POA: Diagnosis not present

## 2023-08-11 DIAGNOSIS — Z23 Encounter for immunization: Secondary | ICD-10-CM | POA: Diagnosis not present

## 2023-08-19 DIAGNOSIS — C61 Malignant neoplasm of prostate: Secondary | ICD-10-CM | POA: Diagnosis not present

## 2023-08-28 DIAGNOSIS — N5201 Erectile dysfunction due to arterial insufficiency: Secondary | ICD-10-CM | POA: Diagnosis not present

## 2023-08-28 DIAGNOSIS — C61 Malignant neoplasm of prostate: Secondary | ICD-10-CM | POA: Diagnosis not present

## 2023-09-08 ENCOUNTER — Other Ambulatory Visit: Payer: Self-pay | Admitting: Cardiovascular Disease

## 2024-03-12 DIAGNOSIS — R7309 Other abnormal glucose: Secondary | ICD-10-CM | POA: Diagnosis not present

## 2024-03-12 DIAGNOSIS — Z1331 Encounter for screening for depression: Secondary | ICD-10-CM | POA: Diagnosis not present

## 2024-03-12 DIAGNOSIS — M25561 Pain in right knee: Secondary | ICD-10-CM | POA: Diagnosis not present

## 2024-03-12 DIAGNOSIS — E782 Mixed hyperlipidemia: Secondary | ICD-10-CM | POA: Diagnosis not present

## 2024-03-12 DIAGNOSIS — Z0000 Encounter for general adult medical examination without abnormal findings: Secondary | ICD-10-CM | POA: Diagnosis not present

## 2024-03-12 DIAGNOSIS — I10 Essential (primary) hypertension: Secondary | ICD-10-CM | POA: Diagnosis not present

## 2024-03-30 NOTE — Progress Notes (Unsigned)
 Cardiology Office Note:  .   Date:  03/30/2024  ID:  Kurt Diaz, DOB 16-Mar-1941, MRN [id number] PCP: Seabron Lenis, MD  [facility] Providers Cardiologist:  Maude Emmer, MD {  History of Present Illness: .   Kurt Diaz is a 83 y.o. male  with PMHx of CAD (s/p sudden [place] followed by DES RCA and LAD in 2006, DES to proximal and distal RCA in 10/25/2016, CABG in 07/03/2018 with LIMA-LAD, SVG-D1 and SVG-RCA, Myoview  11/2021 showed low risk, no ischemia, small septal infract), HLD, HTN, bradycardia,  prostate cancer (receiving XRT, follow with urology Dr. Cam),  who reports to [place] office for follow up.   Last seen in heartcare [facility] 04/07/2023 with Dr. Nishian for a follow up. Overall, doing well without any cardiac complaints. EKG noted sinus brady, HR 55 with PAC. Lopressor  was decreased from 25 mg to 12.5 mg daily.   Today, reports ### and denies ###.  Reports compliance with medications.  Dietary habitats:  Activity level:  Social: Denies tobacco use/Binging ETOH/drug use  Denies any hospitalizations or visits to the emergency department.   CAD  HLD, LDL goal <55 s/p sudden death MI at [place] followed by DES RCA and LAD in 2006, DES to proximal and distal RCA in 10/25/2016, CABG in 07/03/2018 with LIMA-LAD, SVG-D1 and SVG-RCA Myoview  11/2021 showed low risk, no ischemia, small septal infract, EF 55-65% 2019 AST/ALT WNL, 2018 LDL 50. Request Labs from PCP or order CMP and FLP??? Denied any chest or exertional symptoms. No further ischemic evaluation at this time.  Continue ASA 81 mg, Lipitor  80 mg daily, Lisinopril  5 mg, Lopressor  12.5 mg daily and NTG prn   HTN EKG in OV, well controlled:  Continue on Lisinopril  and Lopressor  as above   Bradycardia   ROS: 10 point review of system has been reviewed and considered negative except ones been listed in the HPI.   Studies Reviewed: .   Myoview  11/2021   Findings are consistent  with prior myocardial infarction. The study is low risk.   1.0 mm of horizontal ST depression in the inferolateral leads was noted.   LV perfusion is abnormal. Defect 1: There is a small defect with moderate reduction in uptake present in the mid to basal septal location(s) that is fixed.   Left ventricular function is normal. Nuclear stress EF: 59 %. The left ventricular ejection fraction is normal (55-65%). End diastolic cavity size is normal. End systolic cavity size is normal.   Prior study available for comparison from 04/07/2018.   Small septal infarct at mid and basal level no ischemia EF estimated at 59% HTN response to exercise with  Positive ECG 1 mm horizontal ST depression in inferior lateral leads    TEE 06/2018 Normal cavity size, wall thickness, left ventricular diastolic function and left atrial pressure. LV systolic function is normal with an EF of 60-65%. No thrombus present. No mass present.   Left atrium: Cavity is mildly dilated.   Aortic valve: No stenosis. Mild regurgitation.   Mitral valve: Trace regurgitation.   Right ventricle: Normal cavity size, wall thickness and ejection  fraction.   Pulmonic valve: Trace regurgitation.   Septum: No Patent Foramen Ovale present.   Left atrium: Patent foramen ovale not present.    Cath 05/2018 Conclusions: Significant two-vessel coronary artery disease with 70% ostial LAD (FFR 0.77) stenosis, 80% mid RCA in-stent restenosis, and 70% distal RCA stenosis. Normal left ventricular systolic function and  filling pressure.   Recommendations: Cardiac surgery consultation for CABG. Increase isosorbide  mononitrate to 30 mg daily. Aggressive secondary prevention.   Recommend Aspirin  81mg  daily for multivessel CAD pending CABG evaluation.  Risk Assessment/Calculations:   {Does this patient have ATRIAL FIBRILLATION?:[id number]} No BP recorded.  {Refresh Note OR Click here to enter BP  :1}***       Physical Exam:   VS:   There were no vitals taken for this visit.   Wt Readings from Last 3 Encounters:  04/07/23 186 lb 9.6 oz (84.6 kg)  04/15/22 188 lb (85.3 kg)  11/21/21 192 lb (87.1 kg)    GEN: Well nourished, well developed in no acute distress while sitting in chair.  NECK: No JVD; No carotid bruits CARDIAC: ***RRR, no murmurs, rubs, gallops RESPIRATORY:  Clear to auscultation without rales, wheezing or rhonchi  ABDOMEN: Soft, non-tender, non-distended EXTREMITIES:  No edema; No deformity   ASSESSMENT AND PLAN: .   ***    {Are you ordering a CV Procedure (e.g. stress test, cath, DCCV, TEE, etc)?   Press F2        :[id number]}  Dispo: ***  Signed, Lorette CINDERELLA Kapur, PA-C

## 2024-04-01 ENCOUNTER — Encounter: Payer: Self-pay | Admitting: Physician Assistant

## 2024-04-01 ENCOUNTER — Ambulatory Visit: Attending: General Practice | Admitting: Physician Assistant

## 2024-04-01 VITALS — BP 112/56 | HR 64 | Ht 67.0 in | Wt 190.4 lb

## 2024-04-01 DIAGNOSIS — Z951 Presence of aortocoronary bypass graft: Secondary | ICD-10-CM | POA: Diagnosis not present

## 2024-04-01 DIAGNOSIS — R001 Bradycardia, unspecified: Secondary | ICD-10-CM

## 2024-04-01 DIAGNOSIS — I10 Essential (primary) hypertension: Secondary | ICD-10-CM | POA: Diagnosis not present

## 2024-04-01 DIAGNOSIS — E782 Mixed hyperlipidemia: Secondary | ICD-10-CM | POA: Diagnosis not present

## 2024-04-01 DIAGNOSIS — I25119 Atherosclerotic heart disease of native coronary artery with unspecified angina pectoris: Secondary | ICD-10-CM | POA: Diagnosis not present

## 2024-04-01 MED ORDER — METOPROLOL TARTRATE 25 MG PO TABS: 12.5000 mg | ORAL_TABLET | Freq: Every day | ORAL | 3 refills | Status: AC

## 2024-04-01 NOTE — Patient Instructions (Signed)
 Medication Instructions:  Decrease metoprolol  tartrate to 12.5 mg once a day  *If you need a refill on your cardiac medications before your next appointment, please call your pharmacy*  Lab Work: No labs  Testing/Procedures: No testing  Follow-Up: At [facility], you and your health needs are our priority.  As part of our continuing mission to provide you with exceptional heart care, our providers are all part of one team.  This team includes your primary Cardiologist (physician) and Advanced Practice Providers or APPs (Physician Assistants and Nurse Practitioners) who all work together to provide you with the care you need, when you need it.  Your next appointment:   1 year(s)  Provider:   Maude Emmer, MD    We recommend signing up for the patient portal called MyChart.  Sign up information is provided on this After Visit Summary.  MyChart is used to connect with patients for Virtual Visits (Telemedicine).  Patients are able to view lab/test results, encounter notes, upcoming appointments, etc.  Non-urgent messages can be sent to your provider as well.   To learn more about what you can do with MyChart, go to [email or web address].   Other Instructions Please take your blood pressure daily for 2 weeks and send in a MyChart message. Please include heart rates. (One message at the end of the 2 weeks).   HOW TO TAKE YOUR BLOOD PRESSURE: Rest 5 minutes before taking your blood pressure. Don't smoke or drink caffeinated beverages for at least 30 minutes before. Take your blood pressure before (not after) you eat. Sit comfortably with your back supported and both feet on the floor (don't cross your legs). Elevate your arm to heart level on a table or a desk. Use the proper sized cuff. It should fit smoothly and snugly around your bare upper arm. There should be enough room to slip a fingertip under the cuff. The bottom edge of the cuff should be 1 inch above the crease of the  elbow. Ideally, take 3 measurements at one sitting and record the average.

## 2024-04-23 DIAGNOSIS — Z79899 Other long term (current) drug therapy: Secondary | ICD-10-CM

## 2024-05-06 ENCOUNTER — Other Ambulatory Visit: Payer: Self-pay | Admitting: Cardiovascular Disease

## 2024-05-12 NOTE — Telephone Encounter (Signed)
 Left message for pt to call.

## 2024-06-01 MED ORDER — LISINOPRIL 10 MG PO TABS: 10.0000 mg | ORAL_TABLET | Freq: Every day | ORAL | 3 refills | Status: AC

## 2024-06-01 NOTE — Telephone Encounter (Signed)
 Called patient back with Lorette Kapur PA's advisement. Patient was agreeable to increase lisinopril  to 10 mg by mouth daily.    Kapur Lorette GRADE, PA-C to Me   05/31/24  3:58 PM Please tell patient this a new recommendation from the Scottie, the PA he last saw in the office,  based on BP log submitted. However, if patient does not wish to increase Lisinopril  then we will respect his wishes. Please continue to monitor BP and contact office if BP consistently elevated above > 140/90.
